# Patient Record
Sex: Female | Born: 1935
Health system: Southern US, Community
[De-identification: ages and names within clinical notes are randomized; demographics above are authoritative.]

## PROBLEM LIST (undated history)

## (undated) DIAGNOSIS — H409 Unspecified glaucoma: Secondary | ICD-10-CM

## (undated) DIAGNOSIS — D509 Iron deficiency anemia, unspecified: Secondary | ICD-10-CM

## (undated) DIAGNOSIS — E119 Type 2 diabetes mellitus without complications: Secondary | ICD-10-CM

## (undated) DIAGNOSIS — E785 Hyperlipidemia, unspecified: Secondary | ICD-10-CM

## (undated) DIAGNOSIS — D649 Anemia, unspecified: Secondary | ICD-10-CM

## (undated) DIAGNOSIS — I5030 Unspecified diastolic (congestive) heart failure: Secondary | ICD-10-CM

## (undated) DIAGNOSIS — K5730 Diverticulosis of large intestine without perforation or abscess without bleeding: Secondary | ICD-10-CM

## (undated) DIAGNOSIS — I10 Essential (primary) hypertension: Secondary | ICD-10-CM

## (undated) DIAGNOSIS — M129 Arthropathy, unspecified: Secondary | ICD-10-CM

## (undated) DIAGNOSIS — R0600 Dyspnea, unspecified: Secondary | ICD-10-CM

## (undated) HISTORY — PX: ABDOMINAL HYSTERECTOMY: SHX81

---

## 2000-04-14 ENCOUNTER — Encounter: Admission: RE | Admit: 2000-04-14 | Discharge: 2000-07-13 | Payer: Self-pay | Admitting: Orthopedic Surgery

## 2000-07-28 ENCOUNTER — Encounter: Admission: RE | Admit: 2000-07-28 | Discharge: 2000-10-26 | Payer: Self-pay | Admitting: Orthopedic Surgery

## 2000-11-04 ENCOUNTER — Encounter: Admission: RE | Admit: 2000-11-04 | Discharge: 2001-02-02 | Payer: Self-pay | Admitting: Internal Medicine

## 2000-12-02 ENCOUNTER — Encounter: Payer: Self-pay | Admitting: Emergency Medicine

## 2000-12-03 ENCOUNTER — Encounter: Payer: Self-pay | Admitting: Internal Medicine

## 2000-12-03 ENCOUNTER — Inpatient Hospital Stay (HOSPITAL_COMMUNITY): Admission: EM | Admit: 2000-12-03 | Discharge: 2000-12-05 | Payer: Self-pay | Admitting: Emergency Medicine

## 2001-02-24 ENCOUNTER — Encounter: Admission: RE | Admit: 2001-02-24 | Discharge: 2001-03-01 | Payer: Self-pay | Admitting: Internal Medicine

## 2001-06-02 ENCOUNTER — Encounter: Admission: RE | Admit: 2001-06-02 | Discharge: 2001-06-07 | Payer: Self-pay | Admitting: Internal Medicine

## 2002-01-20 ENCOUNTER — Encounter: Payer: Self-pay | Admitting: Occupational Medicine

## 2002-01-20 ENCOUNTER — Encounter: Admission: RE | Admit: 2002-01-20 | Discharge: 2002-01-20 | Payer: Self-pay | Admitting: Occupational Medicine

## 2002-04-25 ENCOUNTER — Encounter (HOSPITAL_BASED_OUTPATIENT_CLINIC_OR_DEPARTMENT_OTHER): Admission: RE | Admit: 2002-04-25 | Discharge: 2002-05-12 | Payer: Self-pay | Admitting: Orthopedic Surgery

## 2002-08-08 ENCOUNTER — Encounter (HOSPITAL_BASED_OUTPATIENT_CLINIC_OR_DEPARTMENT_OTHER): Admission: RE | Admit: 2002-08-08 | Discharge: 2002-08-12 | Payer: Self-pay | Admitting: Internal Medicine

## 2002-12-17 ENCOUNTER — Encounter: Payer: Self-pay | Admitting: Emergency Medicine

## 2002-12-17 ENCOUNTER — Emergency Department (HOSPITAL_COMMUNITY): Admission: EM | Admit: 2002-12-17 | Discharge: 2002-12-17 | Payer: Self-pay | Admitting: Emergency Medicine

## 2003-02-16 ENCOUNTER — Encounter: Payer: Self-pay | Admitting: Internal Medicine

## 2003-03-09 ENCOUNTER — Encounter: Admission: RE | Admit: 2003-03-09 | Discharge: 2003-03-09 | Payer: Self-pay | Admitting: Internal Medicine

## 2003-03-09 ENCOUNTER — Encounter: Payer: Self-pay | Admitting: Internal Medicine

## 2003-04-20 ENCOUNTER — Encounter (HOSPITAL_BASED_OUTPATIENT_CLINIC_OR_DEPARTMENT_OTHER): Admission: RE | Admit: 2003-04-20 | Discharge: 2003-07-19 | Payer: Self-pay | Admitting: Internal Medicine

## 2003-10-26 ENCOUNTER — Encounter (HOSPITAL_BASED_OUTPATIENT_CLINIC_OR_DEPARTMENT_OTHER): Admission: RE | Admit: 2003-10-26 | Discharge: 2003-11-07 | Payer: Self-pay | Admitting: Internal Medicine

## 2004-02-09 ENCOUNTER — Encounter (HOSPITAL_BASED_OUTPATIENT_CLINIC_OR_DEPARTMENT_OTHER): Admission: RE | Admit: 2004-02-09 | Discharge: 2004-02-15 | Payer: Self-pay | Admitting: Internal Medicine

## 2004-05-15 ENCOUNTER — Encounter (HOSPITAL_BASED_OUTPATIENT_CLINIC_OR_DEPARTMENT_OTHER): Admission: RE | Admit: 2004-05-15 | Discharge: 2004-05-29 | Payer: Self-pay | Admitting: Internal Medicine

## 2004-08-22 ENCOUNTER — Encounter (HOSPITAL_BASED_OUTPATIENT_CLINIC_OR_DEPARTMENT_OTHER): Admission: RE | Admit: 2004-08-22 | Discharge: 2004-08-29 | Payer: Self-pay | Admitting: Internal Medicine

## 2004-12-02 ENCOUNTER — Encounter (HOSPITAL_BASED_OUTPATIENT_CLINIC_OR_DEPARTMENT_OTHER): Admission: RE | Admit: 2004-12-02 | Discharge: 2004-12-13 | Payer: Self-pay | Admitting: Internal Medicine

## 2005-02-28 ENCOUNTER — Ambulatory Visit: Payer: Self-pay | Admitting: Endocrinology

## 2005-05-06 ENCOUNTER — Ambulatory Visit: Payer: Self-pay | Admitting: Endocrinology

## 2005-06-11 ENCOUNTER — Ambulatory Visit: Payer: Self-pay | Admitting: Internal Medicine

## 2005-06-18 ENCOUNTER — Ambulatory Visit: Payer: Self-pay

## 2005-07-09 ENCOUNTER — Ambulatory Visit: Payer: Self-pay | Admitting: Endocrinology

## 2005-09-25 ENCOUNTER — Ambulatory Visit: Payer: Self-pay | Admitting: Endocrinology

## 2005-10-08 ENCOUNTER — Ambulatory Visit: Payer: Self-pay | Admitting: Endocrinology

## 2005-10-09 ENCOUNTER — Ambulatory Visit: Payer: Self-pay | Admitting: Endocrinology

## 2005-10-24 ENCOUNTER — Ambulatory Visit: Payer: Self-pay

## 2006-04-09 ENCOUNTER — Ambulatory Visit: Payer: Self-pay | Admitting: Endocrinology

## 2006-05-21 ENCOUNTER — Ambulatory Visit: Payer: Self-pay | Admitting: Endocrinology

## 2006-08-02 ENCOUNTER — Emergency Department (HOSPITAL_COMMUNITY): Admission: EM | Admit: 2006-08-02 | Discharge: 2006-08-02 | Payer: Self-pay | Admitting: Emergency Medicine

## 2006-08-11 ENCOUNTER — Ambulatory Visit: Payer: Self-pay | Admitting: Endocrinology

## 2006-09-23 ENCOUNTER — Ambulatory Visit: Payer: Self-pay | Admitting: Endocrinology

## 2006-12-30 ENCOUNTER — Ambulatory Visit: Payer: Self-pay | Admitting: Endocrinology

## 2007-01-27 ENCOUNTER — Ambulatory Visit: Payer: Self-pay | Admitting: Endocrinology

## 2007-01-27 LAB — CONVERTED CEMR LAB: Hgb A1c MFr Bld: 7.6 % — ABNORMAL HIGH (ref 4.6–6.0)

## 2007-06-04 ENCOUNTER — Ambulatory Visit: Payer: Self-pay | Admitting: Endocrinology

## 2007-07-16 ENCOUNTER — Encounter: Payer: Self-pay | Admitting: Endocrinology

## 2007-07-16 DIAGNOSIS — I10 Essential (primary) hypertension: Secondary | ICD-10-CM | POA: Insufficient documentation

## 2007-07-16 DIAGNOSIS — I2510 Atherosclerotic heart disease of native coronary artery without angina pectoris: Secondary | ICD-10-CM | POA: Insufficient documentation

## 2007-07-16 DIAGNOSIS — E785 Hyperlipidemia, unspecified: Secondary | ICD-10-CM | POA: Insufficient documentation

## 2007-07-16 DIAGNOSIS — E118 Type 2 diabetes mellitus with unspecified complications: Secondary | ICD-10-CM | POA: Insufficient documentation

## 2007-07-16 DIAGNOSIS — M810 Age-related osteoporosis without current pathological fracture: Secondary | ICD-10-CM | POA: Insufficient documentation

## 2007-07-16 DIAGNOSIS — E119 Type 2 diabetes mellitus without complications: Secondary | ICD-10-CM

## 2007-07-16 DIAGNOSIS — J309 Allergic rhinitis, unspecified: Secondary | ICD-10-CM | POA: Insufficient documentation

## 2007-07-16 HISTORY — DX: Hyperlipidemia, unspecified: E78.5

## 2007-07-16 HISTORY — DX: Type 2 diabetes mellitus without complications: E11.9

## 2008-02-03 ENCOUNTER — Ambulatory Visit: Payer: Self-pay | Admitting: Endocrinology

## 2008-02-03 ENCOUNTER — Encounter: Payer: Self-pay | Admitting: Endocrinology

## 2008-02-03 DIAGNOSIS — R05 Cough: Secondary | ICD-10-CM

## 2008-02-03 DIAGNOSIS — R059 Cough, unspecified: Secondary | ICD-10-CM | POA: Insufficient documentation

## 2008-03-02 ENCOUNTER — Encounter (INDEPENDENT_AMBULATORY_CARE_PROVIDER_SITE_OTHER): Payer: Self-pay | Admitting: *Deleted

## 2008-05-30 DIAGNOSIS — K5730 Diverticulosis of large intestine without perforation or abscess without bleeding: Secondary | ICD-10-CM | POA: Insufficient documentation

## 2008-05-30 DIAGNOSIS — M129 Arthropathy, unspecified: Secondary | ICD-10-CM

## 2008-05-30 DIAGNOSIS — K649 Unspecified hemorrhoids: Secondary | ICD-10-CM | POA: Insufficient documentation

## 2008-05-30 HISTORY — DX: Arthropathy, unspecified: M12.9

## 2008-05-30 HISTORY — DX: Diverticulosis of large intestine without perforation or abscess without bleeding: K57.30

## 2008-06-01 ENCOUNTER — Ambulatory Visit: Payer: Self-pay | Admitting: Internal Medicine

## 2008-06-01 DIAGNOSIS — K5900 Constipation, unspecified: Secondary | ICD-10-CM | POA: Insufficient documentation

## 2008-07-21 ENCOUNTER — Encounter: Payer: Self-pay | Admitting: Internal Medicine

## 2008-07-21 ENCOUNTER — Ambulatory Visit: Payer: Self-pay | Admitting: Internal Medicine

## 2008-07-27 ENCOUNTER — Ambulatory Visit: Payer: Self-pay | Admitting: Family Medicine

## 2008-07-27 ENCOUNTER — Ambulatory Visit: Payer: Self-pay | Admitting: Endocrinology

## 2008-07-29 LAB — CONVERTED CEMR LAB
BUN: 14 mg/dL (ref 6–23)
Basophils Absolute: 0.1 10*3/uL (ref 0.0–0.1)
Basophils Relative: 1.2 % (ref 0.0–3.0)
CO2: 28 meq/L (ref 19–32)
Calcium: 9.1 mg/dL (ref 8.4–10.5)
Chloride: 100 meq/L (ref 96–112)
Creatinine, Ser: 0.9 mg/dL (ref 0.4–1.2)
Eosinophils Absolute: 0.1 10*3/uL (ref 0.0–0.7)
Eosinophils Relative: 1.1 % (ref 0.0–5.0)
Folate: 10.2 ng/mL
GFR calc Af Amer: 79 mL/min
GFR calc non Af Amer: 65 mL/min
Glucose, Bld: 295 mg/dL — ABNORMAL HIGH (ref 70–99)
HCT: 36.3 % (ref 36.0–46.0)
Hemoglobin: 12 g/dL (ref 12.0–15.0)
Hgb A1c MFr Bld: 9.8 % — ABNORMAL HIGH (ref 4.6–6.0)
Iron: 47 ug/dL (ref 42–145)
Lymphocytes Relative: 23.6 % (ref 12.0–46.0)
MCHC: 33.1 g/dL (ref 30.0–36.0)
MCV: 85.6 fL (ref 78.0–100.0)
Monocytes Absolute: 0.4 10*3/uL (ref 0.1–1.0)
Monocytes Relative: 7.7 % (ref 3.0–12.0)
Neutro Abs: 3.7 10*3/uL (ref 1.4–7.7)
Neutrophils Relative %: 66.4 % (ref 43.0–77.0)
Platelets: 339 10*3/uL (ref 150–400)
Potassium: 3.8 meq/L (ref 3.5–5.1)
RBC: 4.24 M/uL (ref 3.87–5.11)
RDW: 13.5 % (ref 11.5–14.6)
Saturation Ratios: 14 % — ABNORMAL LOW (ref 20.0–50.0)
Sodium: 137 meq/L (ref 135–145)
TSH: 1 microintl units/mL (ref 0.35–5.50)
Transferrin: 239.7 mg/dL (ref >=212.0)
Vitamin B-12: 352 pg/mL (ref 211–911)
WBC: 5.6 10*3/uL (ref 4.5–10.5)

## 2008-07-30 ENCOUNTER — Encounter: Payer: Self-pay | Admitting: Internal Medicine

## 2008-09-28 ENCOUNTER — Telehealth: Payer: Self-pay | Admitting: Endocrinology

## 2008-10-03 ENCOUNTER — Ambulatory Visit: Payer: Self-pay | Admitting: Endocrinology

## 2008-11-10 ENCOUNTER — Telehealth (INDEPENDENT_AMBULATORY_CARE_PROVIDER_SITE_OTHER): Payer: Self-pay | Admitting: *Deleted

## 2009-01-25 ENCOUNTER — Ambulatory Visit: Payer: Self-pay | Admitting: Endocrinology

## 2009-01-29 ENCOUNTER — Encounter (INDEPENDENT_AMBULATORY_CARE_PROVIDER_SITE_OTHER): Payer: Self-pay | Admitting: *Deleted

## 2009-01-29 ENCOUNTER — Telehealth: Payer: Self-pay | Admitting: Endocrinology

## 2009-02-02 ENCOUNTER — Ambulatory Visit: Payer: Self-pay | Admitting: Endocrinology

## 2009-02-04 LAB — CONVERTED CEMR LAB: Hgb A1c MFr Bld: 9.6 % — ABNORMAL HIGH (ref 4.6–6.0)

## 2009-05-07 ENCOUNTER — Ambulatory Visit: Payer: Self-pay | Admitting: Endocrinology

## 2009-05-07 DIAGNOSIS — D509 Iron deficiency anemia, unspecified: Secondary | ICD-10-CM | POA: Insufficient documentation

## 2009-05-07 DIAGNOSIS — R0602 Shortness of breath: Secondary | ICD-10-CM | POA: Insufficient documentation

## 2009-05-07 HISTORY — DX: Iron deficiency anemia, unspecified: D50.9

## 2009-05-07 LAB — CONVERTED CEMR LAB
BUN: 14 mg/dL (ref 6–23)
CO2: 30 meq/L (ref 19–32)
Calcium: 8.7 mg/dL (ref 8.4–10.5)
Chloride: 104 meq/L (ref 96–112)
Creatinine, Ser: 0.7 mg/dL (ref 0.4–1.2)
GFR calc non Af Amer: 105.55 mL/min (ref >60)
Glucose, Bld: 409 mg/dL — ABNORMAL HIGH (ref 70–99)
Hgb A1c MFr Bld: 9.7 % — ABNORMAL HIGH (ref 4.6–6.5)
Iron: 51 ug/dL (ref 42–145)
Potassium: 3.7 meq/L (ref 3.5–5.1)
Pro B Natriuretic peptide (BNP): 83 pg/mL (ref 0.0–100.0)
Saturation Ratios: 15.7 % — ABNORMAL LOW (ref 20.0–50.0)
Sodium: 138 meq/L (ref 135–145)
Transferrin: 231.5 mg/dL (ref 212.0–360.0)

## 2009-05-08 ENCOUNTER — Ambulatory Visit: Payer: Self-pay | Admitting: Endocrinology

## 2009-05-10 LAB — CONVERTED CEMR LAB
Basophils Absolute: 0 10*3/uL (ref 0.0–0.1)
Basophils Relative: 0.9 % (ref 0.0–3.0)
Eosinophils Absolute: 0.1 10*3/uL (ref 0.0–0.7)
Eosinophils Relative: 1.6 % (ref 0.0–5.0)
HCT: 34.1 % — ABNORMAL LOW (ref 36.0–46.0)
Hemoglobin: 11.7 g/dL — ABNORMAL LOW (ref 12.0–15.0)
Lymphocytes Relative: 22.4 % (ref 12.0–46.0)
Lymphs Abs: 1.1 10*3/uL (ref 0.7–4.0)
MCHC: 34.3 g/dL (ref 30.0–36.0)
MCV: 86.3 fL (ref 78.0–100.0)
Monocytes Absolute: 0.4 10*3/uL (ref 0.1–1.0)
Monocytes Relative: 8.4 % (ref 3.0–12.0)
Neutro Abs: 3.3 10*3/uL (ref 1.4–7.7)
Neutrophils Relative %: 66.7 % (ref 43.0–77.0)
Platelets: 294 10*3/uL (ref 150.0–400.0)
RBC: 3.95 M/uL (ref 3.87–5.11)
RDW: 13.7 % (ref 11.5–14.6)
WBC: 4.9 10*3/uL (ref 4.5–10.5)

## 2009-05-15 ENCOUNTER — Telehealth: Payer: Self-pay | Admitting: Endocrinology

## 2009-10-06 ENCOUNTER — Emergency Department (HOSPITAL_COMMUNITY): Admission: EM | Admit: 2009-10-06 | Discharge: 2009-10-07 | Payer: Self-pay | Admitting: Emergency Medicine

## 2009-10-12 ENCOUNTER — Ambulatory Visit: Payer: Self-pay | Admitting: Endocrinology

## 2009-10-12 LAB — CONVERTED CEMR LAB
ALT: 17 units/L (ref 0–35)
AST: 24 units/L (ref 0–37)
Albumin: 3.9 g/dL (ref 3.5–5.2)
Alkaline Phosphatase: 48 units/L (ref 39–117)
BUN: 14 mg/dL (ref 6–23)
Basophils Absolute: 0 10*3/uL (ref 0.0–0.1)
Basophils Relative: 0.6 % (ref 0.0–3.0)
Bilirubin Urine: NEGATIVE
Bilirubin, Direct: 0 mg/dL (ref 0.0–0.3)
CO2: 30 meq/L (ref 19–32)
Calcium: 9 mg/dL (ref 8.4–10.5)
Chloride: 101 meq/L (ref 96–112)
Cholesterol: 167 mg/dL (ref 0–200)
Creatinine, Ser: 0.9 mg/dL (ref 0.4–1.2)
Creatinine,U: 43.4 mg/dL
Eosinophils Absolute: 0.2 10*3/uL (ref 0.0–0.7)
Eosinophils Relative: 2.8 % (ref 0.0–5.0)
GFR calc non Af Amer: 78.88 mL/min (ref >60)
Glucose, Bld: 295 mg/dL — ABNORMAL HIGH (ref 70–99)
HCT: 36 % (ref 36.0–46.0)
HDL: 31.4 mg/dL — ABNORMAL LOW (ref >39.00)
Hemoglobin, Urine: NEGATIVE
Hemoglobin: 12.2 g/dL (ref 12.0–15.0)
Hgb A1c MFr Bld: 10.2 % — ABNORMAL HIGH (ref 4.6–6.5)
Iron: 43 ug/dL (ref 42–145)
Ketones, ur: NEGATIVE mg/dL
LDL Cholesterol: 120 mg/dL — ABNORMAL HIGH (ref 0–99)
Leukocytes, UA: NEGATIVE
Lymphocytes Relative: 23.8 % (ref 12.0–46.0)
Lymphs Abs: 1.3 10*3/uL (ref 0.7–4.0)
MCHC: 34 g/dL (ref 30.0–36.0)
MCV: 86.5 fL (ref 78.0–100.0)
Microalb Creat Ratio: 20.7 mg/g (ref 0.0–30.0)
Microalb, Ur: 0.9 mg/dL (ref 0.0–1.9)
Monocytes Absolute: 0.6 10*3/uL (ref 0.1–1.0)
Monocytes Relative: 11 % (ref 3.0–12.0)
Neutro Abs: 3.4 10*3/uL (ref 1.4–7.7)
Neutrophils Relative %: 61.8 % (ref 43.0–77.0)
Nitrite: NEGATIVE
Platelets: 310 10*3/uL (ref 150.0–400.0)
Potassium: 3.5 meq/L (ref 3.5–5.1)
RBC: 4.16 M/uL (ref 3.87–5.11)
RDW: 13.5 % (ref 11.5–14.6)
Saturation Ratios: 12.9 % — ABNORMAL LOW (ref 20.0–50.0)
Sodium: 140 meq/L (ref 135–145)
Specific Gravity, Urine: 1.015 (ref 1.000–1.030)
TSH: 0.94 microintl units/mL (ref 0.35–5.50)
Total Bilirubin: 0.5 mg/dL (ref 0.3–1.2)
Total CHOL/HDL Ratio: 5
Total Protein, Urine: NEGATIVE mg/dL
Total Protein: 7.7 g/dL (ref 6.0–8.3)
Transferrin: 237.3 mg/dL (ref 212.0–360.0)
Triglycerides: 76 mg/dL (ref 0.0–149.0)
Urine Glucose: >=1000 mg/dL
Urobilinogen, UA: 1 (ref 0.0–1.0)
VLDL: 15.2 mg/dL (ref 0.0–40.0)
WBC: 5.5 10*3/uL (ref 4.5–10.5)
pH: 6 (ref 5.0–8.0)

## 2009-10-25 ENCOUNTER — Ambulatory Visit: Payer: Self-pay | Admitting: Endocrinology

## 2009-11-29 ENCOUNTER — Ambulatory Visit: Payer: Self-pay | Admitting: Endocrinology

## 2010-03-01 ENCOUNTER — Telehealth: Payer: Self-pay | Admitting: Endocrinology

## 2010-04-08 ENCOUNTER — Encounter: Payer: Self-pay | Admitting: Endocrinology

## 2010-04-23 ENCOUNTER — Telehealth: Payer: Self-pay | Admitting: Endocrinology

## 2010-12-25 ENCOUNTER — Encounter (INDEPENDENT_AMBULATORY_CARE_PROVIDER_SITE_OTHER): Payer: Self-pay | Admitting: *Deleted

## 2011-01-21 NOTE — Assessment & Plan Note (Signed)
Summary: 3 mos f/u  $50 / cd   Vital Signs:  Patient profile:   77 year old female Height:      71 inches Weight:      181 pounds BMI:     25.34 Temp:     97.8 degrees F oral Pulse rate:   65 / minute BP sitting:   118 / 58  (left arm) Cuff size:   regular  Vitals Entered By: Charlynne Cousins CMA (May 07, 2009 8:21 AM) CC: follow-up visit, pt states doing well/ ab   Primary Provider:  Luisa Hart  CC:  follow-up visit and pt states doing well/ ab.  History of Present Illness: pt states 1 day of mild doe.  no wheezing. ran out of actos 1 month ago.  no cbg record, but states cbg's are well-controlled. pt does not take fe supplement  Current Medications (verified): 1)  Actos 45 Mg Tabs (Pioglitazone Hcl) .... One Tablet By Mouth Once Daily 2)  Cozaar 100 Mg Tabs (Losartan Potassium) .... One Tablet By Mouth Once Daily 3)  Klor-Con M20 20 Meq Tbcr (Potassium Chloride Crys Cr) .... Take 1 Tablet By Mouth Every Morning 4)  Metformin Hcl 1000 Mg Tabs (Metformin Hcl) .... One Tablet By Mouth Two Times A Day 5)  Zestoretic 20-12.5 Mg Tabs (Lisinopril-Hydrochlorothiazide) .... 2 Tablet By Mouth Once Daily 6)  Onetouch Ultra Test   Strp (Glucose Blood) .... Use As Directed 7)  Aspirin 81 Mg  Tabs (Aspirin) .... One Tablet By Mouth Once Daily 8)  Miralax   Powd (Polyethylene Glycol 3350) .... One Packet As Needed 9)  Nifedipine 30 Mg  Tb24 (Nifedipine) .... 2 Tablets By Mouth Once Daily 10)  Fluoxetine Hcl 20 Mg  Caps (Fluoxetine Hcl) .... Qd  Allergies (verified): 1)  ! Penicillin 2)  ! Codeine  Past History:  Past Medical History:    Chronic Nonadherence     CONSTIPATION (ICD-564.00)    SCREENING COLORECTAL-CANCER (ICD-V76.51)    ARTHRITIS (ICD-716.90)    HEMORRHOIDS (ICD-455.6)    DIVERTICULOSIS, COLON (ICD-562.10)    COUGH (ICD-786.2)    OSTEOPOROSIS (ICD-733.00)    HYPERTENSION (ICD-401.9)    HYPERLIPIDEMIA, rx refused (ICD-272.4)    DIABETES MELLITUS, TYPE II  (ICD-250.00)    CORONARY ARTERY DISEASE (ICD-414.00)    ANEMIA-NOS (ICD-285.9)    ALLERGIC RHINITIS (ICD-477.9) (07/27/2008)  Past Surgical History:    Hysterectomy    Cardiac catherization    Pen Knee surgery (1975)    Adenosine Stress Cardiolite (06/18/2005)    Carotid duplex (10/24/2005)    appendectomy    left 5th toe "bone shaved" 2010  Review of Systems  The patient denies fever, chest pain, and prolonged cough.    Physical Exam  General:  obese.   Lungs:  Clear to auscultation bilaterally. Normal respiratory effort.  Additional Exam:  CHEST - 2 VIEW Query COPD.  Iron Saturation      [L]  15.7 %                      20.0-50.0 B-Type Natiuretic Peptide    83 Hemoglobin A1C       [H]  9.7 %    Impression & Recommendations:  Problem # 1:  DIABETES MELLITUS, TYPE II (ICD-250.00) therapy limited by noncompliance.  i'll do the best i can.  Problem # 2:  DYSPNEA (XX123456) uncertain etiology  Problem # 3:  ANEMIA, IRON DEFICIENCY (XX123456) uncertain etiology  Other Orders: T-2 View  CXR (71020TC) TLB-IBC Pnl (Iron/FE;Transferrin) (83550-IBC) TLB-BNP (B-Natriuretic Peptide) (83880-BNPR) TLB-A1C / Hgb A1C (Glycohemoglobin) (83036-A1C) TLB-BMP (Basic Metabolic Panel-BMET) (99991111) TLB-CBC Platelet - w/Differential (85025-CBCD) Pulmonary Referral (Pulmonary) Gastroenterology Referral (GI) Est. Patient Level IV [id number])  Patient Instructions: 1)  physical 3 mos 2)  i again advised insulin 3)  ref for egd 4)  check pft

## 2011-01-21 NOTE — Letter (Signed)
Summary: Patient Notice- Polyp Results  [facility]  [address] [place], [place] [address]   Phone: [phone]  Fax: [phone]        July 30, 2008 MRN: [id number]    [place], [place]  [address]    Dear Ms. OSMOND,  I am pleased to inform you that the colon polyp(s) removed during your recent colonoscopy was (were) found to be benign (no cancer detected) upon pathologic examination.  I recommend you have a repeat colonoscopy examination in 5 years to look for recurrent polyps, as having colon polyps increases your risk for having recurrent polyps or even colon cancer in the future.  Should you develop new or worsening symptoms of abdominal pain, bowel habit changes or bleeding from the rectum or bowels, please schedule an evaluation with either your primary care physician or with me.  Additional information/recommendations:  _X_ No further action with gastroenterology is needed at this time. Please      follow-up with your primary care physician for your other healthcare      needs.   Please call us if you are having persistent problems or have questions about your condition that have not been fully answered at this time.  Sincerely,  Irene Shipper MD  This letter has been electronically signed by your physician.

## 2011-01-21 NOTE — Progress Notes (Signed)
Summary: Test strips  Phone Note From Pharmacy   Caller: [employer] [phone] Call For: [phone]  Summary of Call: PA request--One touch ultra test strips. I confirmed with [employer] that the patient plan only cover # 153 for every 90 days. Any additional must be paid for out of pocket. Patient can get he usual #50 on 03/18/10. 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 Closed phone note until patient contacts the office.  **returned fax to pharmacy stating that patient must contact office. Initial call taken by: Ernestene Mention,  March 01, 2010 10:30 AM

## 2011-01-21 NOTE — Assessment & Plan Note (Signed)
Summary: 1 MTH FU  STC   Vital Signs:  Patient profile:   77 year old female Height:      71 inches (180.34 cm) Weight:      184.38 pounds (83.81 kg) O2 Sat:      97 % on Room air Temp:     97.2 degrees F (36.22 degrees C) oral Pulse rate:   57 / minute BP sitting:   162 / 58  (left arm) Cuff size:   large  Vitals Entered By: Gardenia Phlegm CMA (November 29, 2009 8:38 AM)  O2 Flow:  Room air CC: 1 month follow up/ CF Is Patient Diabetic? Yes   Primary Provider:  Luisa Hart  CC:  1 month follow up/ CF.  History of Present Illness: pt is chronically dissatisfied.  she says her copay ($25) is too high, and she had to wait (she was 30 minutes early for her appointment), and her results are never on phone tree. she insists she takes 3 different pills for htn, while our med list shows only 2. she takes amaryl and metformin as rx'ed.  no cbg record, but states cbg's are well-controlled. she takes zocor as rx'ed.     Current Medications (verified): 1)  Klor-Con M20 20 Meq Tbcr (Potassium Chloride Crys Cr) .... Take 1 Tablet By Mouth Every Morning 2)  Metformin Hcl 1000 Mg Tabs (Metformin Hcl) .... One Tablet By Mouth Two Times A Day 3)  Onetouch Ultra Test   Strp (Glucose Blood) .... Use As Directed 4)  Aspirin 81 Mg  Tabs (Aspirin) .... One Tablet By Mouth Once Daily 5)  Miralax   Powd (Polyethylene Glycol 3350) .... One Packet As Needed 6)  Nifedipine 30 Mg  Tb24 (Nifedipine) .... 2 Tablets By Mouth Once Daily 7)  Glimepiride 4 Mg Tabs (Glimepiride) .Marland Kitchen.. 1 Bid 8)  Losartan Potassium-Hctz 100-25 Mg Tabs (Losartan Potassium-Hctz) .Marland Kitchen.. 1 Qd 9)  Simvastatin 40 Mg Tabs (Simvastatin) .Marland Kitchen.. 1 Qhs 10)  Screening Mammogram  Allergies (verified): 1)  ! Penicillin 2)  ! Codeine  Past History:  Past Medical History: Last updated: 07/27/2008 Chronic Nonadherence  CONSTIPATION (ICD-564.00) SCREENING COLORECTAL-CANCER (ICD-V76.51) ARTHRITIS (ICD-716.90) HEMORRHOIDS  (ICD-455.6) DIVERTICULOSIS, COLON (ICD-562.10) COUGH (ICD-786.2) OSTEOPOROSIS (ICD-733.00) HYPERTENSION (ICD-401.9) HYPERLIPIDEMIA, rx refused (ICD-272.4) DIABETES MELLITUS, TYPE II (ICD-250.00) CORONARY ARTERY DISEASE (ICD-414.00) ANEMIA-NOS (ICD-285.9) ALLERGIC RHINITIS (ICD-477.9)  Review of Systems  The patient denies chest pain and dyspnea on exertion.    Physical Exam  General:  obese.  no distress  Extremities:  no edema   Impression & Recommendations:  Problem # 1:  HYPERTENSION (ICD-401.9) ? complicance  Problem # 2:  DIABETES MELLITUS, TYPE II (ICD-250.00) ? control  Problem # 3:  HYPERLIPIDEMIA (ICD-272.4)  Other Orders: TLB-Lipid Panel (80061-LIPID) TLB-Hepatic/Liver Function Pnl (80076-HEPATIC) T-Fructosamine [id number]) Est. Patient Level IV [id number])  Patient Instructions: 1)  pt refuses to increase bp meds.  2)  therefore, continue same meds. 3)  Please schedule a follow-up appointment in 6 months. 4)  tests are being ordered for you today.  a few days after the test(s), please call [phone] to hear your test results. 5)  i told pt that she is not being mistreated, and she is at risk for serious health problems by her refusal of rx.  she refuses blood tests today, and refuses to take this instruction sheet.

## 2011-01-21 NOTE — Progress Notes (Signed)
Summary: ov needed  Phone Note Outgoing Call   Summary of Call: MD notes that the patient needs f/u office visit. Attempted to call patient at home and was notified that it is no longer her number. I also tried work number and that goes to [employer] of a Mrs. Lavella Hammock. Initial call taken by: Ernestene Mention,  Apr 23, 2010 3:51 PM  Follow-up for Phone Call        noted, thank you Follow-up by: Donavan Foil MD,  Apr 23, 2010 3:54 PM

## 2011-01-21 NOTE — Progress Notes (Signed)
Summary: pending labs  Phone Note Outgoing Call   Call placed by: Tomma Lightning,  January 29, 2009 11:40 AM Call placed to: Patient Summary of Call: Pt saw Dr. Loanne Drilling on 01/25/09 and he wanted her to go downstar[place] for labwork.  Patient did'nt go downstairs for lab work.  Sending patient a reminder letter to come back in for labwork. Initial call taken by: Tomma Lightning,  January 29, 2009 11:41 AM

## 2011-01-21 NOTE — Miscellaneous (Signed)
Summary: BONE DENSITY  Clinical Lists Changes  Orders: Added new Test order of T-Lumbar Vertebral Assessment (77082) - Signed 

## 2011-01-21 NOTE — Progress Notes (Signed)
  Phone Note Call from Patient   Call For: Donavan Foil MD Summary of Call: pt was sent to referral coordinator for referral to GI and Pulmon. Mary in ref. called pt and she did not know why she was being referred so I called pt and informed her of why MD requested or suggested she have these ref and pt said she "was not going to make any appts right now because she was no taking any time off work" Please advise/ ab  Follow-up for Phone Call        noted, thank you Follow-up by: Donavan Foil MD,  May 15, 2009 4:33 PM

## 2011-01-21 NOTE — Assessment & Plan Note (Signed)
Summary: RASH/ ITCHING / NWS   Vital Signs:  Patient Profile:   77 Years Old Female Height:     71 inches Weight:      179.0 pounds O2 Sat:      97 % O2 treatment:    Room Air Temp:     97.0 degrees F oral Pulse rate:   74 / minute BP sitting:   178 / 72  (left arm) Cuff size:   regular  Pt. in pain?   no  Vitals Entered By: Tomma Lightning (January 25, 2009 9:22 AM)                  PCP:  Luisa Hart  Chief Complaint:  ? RASH PT STATES SHE IS ITCHING ALL OVER.  History of Present Illness: i day of itching on the abdomen and axillae.  unable to cite precip factor. pt says she takes her bp meds as rx'ed, but did not take today. she says her dm is well-controlled but has no cbg record    Prior Medications Reviewed Using: Patient Recall  Prior Medication List:  ACTOS 45 MG TABS (PIOGLITAZONE HCL) one tablet by mouth once daily COZAAR 100 MG TABS (LOSARTAN POTASSIUM) one tablet by mouth once daily KLOR-CON M20 20 MEQ TBCR (POTASSIUM CHLORIDE CRYS CR) Take 1 tablet by mouth every morning METFORMIN HCL 1000 MG TABS (METFORMIN HCL) one tablet by mouth two times a day ZESTORETIC 20-12.5 MG TABS (LISINOPRIL-HYDROCHLOROTHIAZIDE) 2 tablet by mouth once daily ONETOUCH ULTRA TEST   STRP (GLUCOSE BLOOD) use as directed ASPIRIN 81 MG  TABS (ASPIRIN) one tablet by mouth once daily MIRALAX   POWD (POLYETHYLENE GLYCOL 3350) one packet as needed NIFEDIPINE 30 MG  TB24 (NIFEDIPINE) 2 tablets by mouth once daily FLUOXETINE HCL 20 MG  CAPS (FLUOXETINE HCL) qd AMARYL 4 MG  TABS (GLIMEPIRIDE) bid   Updated Prior Medication List: ACTOS 45 MG TABS (PIOGLITAZONE HCL) one tablet by mouth once daily COZAAR 100 MG TABS (LOSARTAN POTASSIUM) one tablet by mouth once daily KLOR-CON M20 20 MEQ TBCR (POTASSIUM CHLORIDE CRYS CR) Take 1 tablet by mouth every morning METFORMIN HCL 1000 MG TABS (METFORMIN HCL) one tablet by mouth two times a day ZESTORETIC 20-12.5 MG TABS  (LISINOPRIL-HYDROCHLOROTHIAZIDE) 2 tablet by mouth once daily ONETOUCH ULTRA TEST   STRP (GLUCOSE BLOOD) use as directed ASPIRIN 81 MG  TABS (ASPIRIN) one tablet by mouth once daily MIRALAX   POWD (POLYETHYLENE GLYCOL 3350) one packet as needed NIFEDIPINE 30 MG  TB24 (NIFEDIPINE) 2 tablets by mouth once daily FLUOXETINE HCL 20 MG  CAPS (FLUOXETINE HCL) qd AMARYL 4 MG  TABS (GLIMEPIRIDE) bid  Current Allergies (reviewed today): ! PENICILLIN ! CODEINE  Past Medical History:    Reviewed history from 07/27/2008 and no changes required:       Chronic Nonadherence        CONSTIPATION (ICD-564.00)       SCREENING COLORECTAL-CANCER (ICD-V76.51)       ARTHRITIS (ICD-716.90)       HEMORRHOIDS (ICD-455.6)       DIVERTICULOSIS, COLON (ICD-562.10)       COUGH (ICD-786.2)       OSTEOPOROSIS (ICD-733.00)       HYPERTENSION (ICD-401.9)       HYPERLIPIDEMIA, rx refused (ICD-272.4)       DIABETES MELLITUS, TYPE II (ICD-250.00)       CORONARY ARTERY DISEASE (ICD-414.00)       ANEMIA-NOS (ICD-285.9)       ALLERGIC  RHINITIS (ICD-477.9)     Review of Systems  The patient denies fever, weight loss, and weight gain.     Physical Exam  General:     obese.   Skin:     no rash seen    Impression & Recommendations:  Problem # 1:  DIABETES MELLITUS, TYPE II (XX123456) uncertain control  Problem # 2:  HYPERTENSION (ICD-401.9) therapy limited by noncompliance   Problem # 3:  pruritis uncertain etiology   Medications Added to Medication List This Visit: 1)  Triamcinolone Acetonide 0.025 % Crea (Triamcinolone acetonide) .... Three times a day as needed itching   Patient Instructions: 1)  tac cream 0.025% three times a day as needed 2)  always take medication as prescribed. 3)  a1c today 4)  physical 3 mos with a1c and microalb 250.00   Prescriptions: TRIAMCINOLONE ACETONIDE 0.025 % CREA (TRIAMCINOLONE ACETONIDE) three times a day as needed itching  #1 med tube x 1   Entered  and Authorized by:   Donavan Foil MD   Signed by:   Donavan Foil MD on 01/25/2009   Method used:   Electronically to        [facility] (retail)       [address]       [place], [place]  [address]       Ph: [phone]       Fax: [phone]   RxID:   [id number]

## 2011-01-21 NOTE — Procedures (Signed)
Summary: colon/[facility]  colon/[facility]   Imported By: Bubba Hales 07/25/2008 08:49:41  _____________________________________________________________________  External Attachment:    Type:   Image     Comment:   External Document

## 2011-01-21 NOTE — Assessment & Plan Note (Signed)
Summary: flu shot/sae/cd  Nurse Visit   Vital Signs:  Patient Profile:   77 Years Old Female Height:     71 inches Temp:     97.9 degrees F oral  Vitals Entered By: Hector Brunswick (October 03, 2008 8:48 AM)                 Prior Medications: ACTOS 45 MG TABS (PIOGLITAZONE HCL) one tablet by mouth once daily COZAAR 100 MG TABS (LOSARTAN POTASSIUM) one tablet by mouth once daily KLOR-CON M20 20 MEQ TBCR (POTASSIUM CHLORIDE CRYS CR) Take 1 tablet by mouth every morning METFORMIN HCL 1000 MG TABS (METFORMIN HCL) one tablet by mouth two times a day ZESTORETIC 20-12.5 MG TABS (LISINOPRIL-HYDROCHLOROTHIAZIDE) 2 tablet by mouth once daily ONETOUCH ULTRA TEST   STRP (GLUCOSE BLOOD) use as directed ASPIRIN 81 MG  TABS (ASPIRIN) one tablet by mouth once daily MIRALAX   POWD (POLYETHYLENE GLYCOL 3350) one packet as needed NIFEDIPINE 30 MG  TB24 (NIFEDIPINE) 2 tablets by mouth once daily FLUOXETINE HCL 20 MG  CAPS (FLUOXETINE HCL) qd AMARYL 4 MG  TABS (GLIMEPIRIDE) bid Current Allergies: ! PENICILLIN ! CODEINE    Orders Added: 1)  Admin 1st Vaccine [90471] 2)  Flu Vaccine 44yrs + [id number]    Flu Vaccine Consent Questions     Do you have a history of severe allergic reactions to this vaccine? no    Any prior history of allergic reactions to egg and/or gelatin? no    Do you have a sensitivity to the preservative Thimersol? no    Do you have a past history of Guillan-Barre Syndrome? no    Do you currently have an acute febrile illness? no    Have you ever had a severe reaction to latex? no    Vaccine information given and explained to patient? yes    Are you currently pregnant? no    Lot Number:[id number]   Site Given  Left Deltoid IM .opcflu

## 2011-01-21 NOTE — Assessment & Plan Note (Signed)
Summary: COLD/CONGESTION/SORE THROAT/NWS   Vital Signs:  Patient profile:   77 year old female Height:      71 inches (180.34 cm) Weight:      183.38 pounds (83.35 kg) O2 Sat:      96 % on Room air Temp:     98.0 degrees F (36.67 degrees C) oral Pulse rate:   79 / minute BP sitting:   152 / 68  (left arm) Cuff size:   large  Vitals Entered By: Gardenia Phlegm CMA (October 12, 2009 9:13 AM)  O2 Flow:  Room air CC: coughing, throat itching, headache x3 days/ CF Is Patient Diabetic? Yes   Primary Hildreth Robart:  Krista Rosales  CC:  coughing, throat itching, and headache x3 days/ CF.  History of Present Illness: see above, x 4 days. pt stopped actos, out of fear of side-effects. she takes bp meds as rx'ed, and says she does not have a cough in general. she says depression is better, and she no longer takes prozac  Current Medications (verified): 1)  Actos 45 Mg Tabs (Pioglitazone Hcl) .... One Tablet By Mouth Once Daily 2)  Cozaar 100 Mg Tabs (Losartan Potassium) .... One Tablet By Mouth Once Daily 3)  Klor-Con M20 20 Meq Tbcr (Potassium Chloride Crys Cr) .... Take 1 Tablet By Mouth Every Morning 4)  Metformin Hcl 1000 Mg Tabs (Metformin Hcl) .... One Tablet By Mouth Two Times A Day 5)  Zestoretic 20-12.5 Mg Tabs (Lisinopril-Hydrochlorothiazide) .... 2 Tablet By Mouth Once Daily 6)  Onetouch Ultra Test   Strp (Glucose Blood) .... Use As Directed 7)  Aspirin 81 Mg  Tabs (Aspirin) .... One Tablet By Mouth Once Daily 8)  Miralax   Powd (Polyethylene Glycol 3350) .... One Packet As Needed 9)  Nifedipine 30 Mg  Tb24 (Nifedipine) .... 2 Tablets By Mouth Once Daily 10)  Fluoxetine Hcl 20 Mg  Caps (Fluoxetine Hcl) .... Qd  Allergies (verified): 1)  ! Penicillin 2)  ! Codeine  Past History:  Past Medical History: Last updated: 07/27/2008 Chronic Nonadherence  CONSTIPATION (ICD-564.00) SCREENING COLORECTAL-CANCER (ICD-V76.51) ARTHRITIS (ICD-716.90) HEMORRHOIDS  (ICD-455.6) DIVERTICULOSIS, COLON (ICD-562.10) COUGH (ICD-786.2) OSTEOPOROSIS (ICD-733.00) HYPERTENSION (ICD-401.9) HYPERLIPIDEMIA, rx refused (ICD-272.4) DIABETES MELLITUS, TYPE II (ICD-250.00) CORONARY ARTERY DISEASE (ICD-414.00) ANEMIA-NOS (ICD-285.9) ALLERGIC RHINITIS (ICD-477.9)  Review of Systems  The patient denies fever and dyspnea on exertion.    Physical Exam  General:  obese.  no distress  Head:  head: no deformity eyes: no periorbital swelling, no proptosis external nose and ears are normal mouth: no lesion seen Ears:  both tm's are red Psych:  easily distracted.  receives cell phone call during visit.     Impression & Recommendations:  Problem # 1:  uri  Problem # 2:  DIABETES MELLITUS, TYPE II (ICD-250.00) therapy limited by noncompliance.  i'll do the best i can.  Problem # 3:  depression improved  Problem # 4:  HYPERTENSION (ICD-401.9)  Medications Added to Medication List This Visit: 1)  Azithromycin 500 Mg Tabs (Azithromycin) .Marland Kitchen.. 1 qd 2)  Benzonatate 100 Mg Caps (Benzonatate) .Marland Kitchen.. 1 three times a day as needed cough  Other Orders: TLB-A1C / Hgb A1C (Glycohemoglobin) (83036-A1C) TLB-Lipid Panel (80061-LIPID) TLB-BMP (Basic Metabolic Panel-BMET) (99991111) TLB-CBC Platelet - w/Differential (85025-CBCD) TLB-Hepatic/Liver Function Pnl (80076-HEPATIC) TLB-TSH (Thyroid Stimulating Hormone) (84443-TSH) TLB-Microalbumin/Creat Ratio, Urine (82043-MALB) TLB-Udip w/ Micro (81001-URINE) TLB-IBC Pnl (Iron/FE;Transferrin) (83550-IBC) Est. Patient Level IV [id number])  Patient Instructions: 1)  zithromax 500 mg once daily. 2)  loratadine-d 3)  benzonatate 100 mg three times a day as needed cough. 4)  physical soon:  we'll recheck bp then 5)  ok to stay-off prozac 6)  i advised pt of risks of refusal of medications Prescriptions: BENZONATATE 100 MG CAPS (BENZONATATE) 1 three times a day as needed cough  #30 x 0   Entered and Authorized by:   Donavan Foil MD   Signed by:   Donavan Foil MD on 10/12/2009   Method used:   Electronically to        [facility] (retail)       [place], [place]  [address]       Ph: [phone]       Fax: [phone]   [id number] AZITHROMYCIN 500 MG TABS (AZITHROMYCIN) 1 qd  #6 x 0   Entered and Authorized by:   Donavan Foil MD   Signed by:   Donavan Foil MD on 10/12/2009   Method used:   Electronically to        [facility] (retail)       [address]       [place],   [address]       Ph: [phone]       Fax: [phone]   RxID:   [id number]

## 2011-01-21 NOTE — Letter (Signed)
Summary: Generic Letter  [facility]   [place], [place] [address]   Phone: [phone]  Fax: [phone]    01/29/2009  TICIA STEPT[address] [place], [place]  [address]  Dear Ms. INZER,   When you saw Dr. Loanne Drilling on 01/25/09 he wanted you to go down stairs for labwork.  According to our records you did'nt go downstairs for labwork.  Please at your earliest convience Dr. Loraine Grip would like for you to stop back by the lab and have bloodwork done.  Lab open form 7:30-5:30     Sincerely,     Lucy Brand,RMA/DR. Renato Shin [facility]

## 2011-01-21 NOTE — Progress Notes (Signed)
Summary:  Pneumonia shot  Phone Note Call from Patient Call back at Home Phone [phone]   Caller: [phone] Call For: Donavan Foil MD Summary of Call: per Fletcher Anon call when is she suppose to have her Pneumonia shot? Initial call taken by: Nonah Mattes,  November 10, 2008 11:24 AM  Follow-up for Phone Call        you do not need to get it anymore.  you had it 5 years ago Follow-up by: Donavan Foil MD,  November 10, 2008 12:40 PM  Additional Follow-up for Phone Call Additional follow up Details #1::        called pt to inform pt made aware Additional Follow-up by: Nonah Mattes,  November 10, 2008 1:05 PM

## 2011-01-21 NOTE — Miscellaneous (Signed)
Summary: Orders Update  Clinical Lists Changes  Orders: Added new Service order of [place] NS50 (NS50) - Signed 

## 2011-01-21 NOTE — Letter (Signed)
Summary: Care Consideration for [place] Consideration for [place]   Imported By: Phillis Knack 04/25/2010 11:02:20  _____________________________________________________________________  External Attachment:    Type:   Image     Comment:   External Document

## 2011-01-21 NOTE — Assessment & Plan Note (Signed)
Summary: fu on meds/jss   Vital Signs:  Patient Profile:   77 Years Old Female Height:     71 inches Weight:      189.0 pounds Temp:     98.3 degrees F oral Pulse rate:   65 / minute BP sitting:   130 / 68  (left arm) Cuff size:   regular  Pt. in pain?   no  Vitals Entered By: Tomma Lightning (July 27, 2008 8:12 AM)                  PCP:  Luisa Hart  Chief Complaint:  follow-up on meds.  History of Present Illness: pt says "hot flashes" x 8 mos.  says it feels like menopause despite the fact that those sxs resolved many years ago. takes bp meds as rx'ed. pt says her d is well-controlled.    Current Allergies: ! PENICILLIN ! CODEINE  Past Medical History:    Reviewed history from 06/01/2008 and no changes required:       Chronic Nonadherence        CONSTIPATION (ICD-564.00)       SCREENING COLORECTAL-CANCER (ICD-V76.51)       ARTHRITIS (ICD-716.90)       HEMORRHOIDS (ICD-455.6)       DIVERTICULOSIS, COLON (ICD-562.10)       COUGH (ICD-786.2)       OSTEOPOROSIS (ICD-733.00)       HYPERTENSION (ICD-401.9)       HYPERLIPIDEMIA, rx refused (ICD-272.4)       DIABETES MELLITUS, TYPE II (ICD-250.00)       CORONARY ARTERY DISEASE (ICD-414.00)       ANEMIA-NOS (ICD-285.9)       ALLERGIC RHINITIS (ICD-477.9)     Review of Systems  The patient denies weight loss and weight gain.     Physical Exam  General:     obese.   Lungs:     clear to auscultation.  no respiratory distress  Heart:     regular rate and rhythm, S1, S2 without murmurs, rubs, gallops, or clicks    Impression & Recommendations:  Problem # 1:  HYPERTENSION (ICD-401.9)  Problem # 2:  DIABETES MELLITUS, TYPE II (ICD-250.00)  Problem # 3:  hot flashes uncertain etiology  Medications Added to Medication List This Visit: 1)  Fluoxetine Hcl 20 Mg Caps (Fluoxetine hcl) .... Qd  Other Orders: TLB-CBC Platelet - w/Differential (85025-CBCD) TLB-IBC Pnl (Iron/FE;Transferrin)  (83550-IBC) TLB-B12 + Folate Pnl NF:800672) T-Bone Densitometry JL:[id number])   Patient Instructions: 1)  same rx for htn 2)  a1c 3)  prozac 20/d 4)  ret 3 mos   Prescriptions: FLUOXETINE HCL 20 MG  CAPS (FLUOXETINE HCL) qd  #34 x 11   Entered and Authorized by:   Donavan Foil MD   Signed by:   Donavan Foil MD on 07/27/2008   Method used:   Electronically sent to ...       [facility] [address]       [place], [place]  [address]       Ph: [phone]       Fax: [phone]   RxIDID:[id number] NIFEDIPINE 30 MG  TB24 (NIFEDIPINE) 2 tablets by mouth once daily  #34 x 11   Entered and Authorized by:   Donavan Foil MD   Signed by:   Donavan Foil  MD on 07/27/2008   Method used:   Electronically sent to ...       [facility] [address]*       [place], [place]  [address]       Ph: [phone]       Fax: [phone]   RxIDKZ:[id number] [place]   POWD (POLYETHYLENE GLYCOL 3350) one packet as needed  #34 x 11   Entered and Authorized by:   Donavan Foil MD   Signed by:   Donavan Foil MD on 07/27/2008   Method used:   Electronically sent to ...       [facility] [address]*       [place], [place]  [address]       Ph: [phone]       Fax: [phone]   RxIDZW:[id number] ONETOUCH ULTRA TEST   STRP (GLUCOSE BLOOD) use as directed  #100 x 6   Entered and Authorized by:   Donavan Foil MD   Signed by:   Donavan Foil MD on 07/27/2008   Method used:   Electronically sent to ...       [facility] [address]*       [place], [place]  [address]       Ph: [phone]       Fax: [phone]   RxIDEL:[id number] ZESTORETIC 20-12.5 MG TABS (LISINOPRIL-HYDROCHLOROTHIAZIDE) 2 tablet by mouth once daily  #68 x 11   Entered and Authorized by:   Donavan Foil MD   Signed  by:   Donavan Foil MD on 07/27/2008   Method used:   Electronically sent to ...       [facility] [address]*       [place], [place]  [address]       Ph: [phone]       Fax: [phone]   RxIDRY:[id number] METFORMIN HCL 1000 MG TABS (METFORMIN HCL) one tablet by mouth two times a day  #68 x 11   Entered and Authorized by:   Donavan Foil MD   Signed by:   Donavan Foil MD on 07/27/2008   Method used:   Electronically sent to ...       [facility] [address]*       [place], [place]  [address]       Ph: [phone]       Fax: [phone]   RxID:   [id number] KLOR-CON M20 20 MEQ TBCR (POTASSIUM CHLORIDE CRYS CR) Take 1 tablet by mouth every morning  #68 Tablet x 11   Entered and Authorized by:   Donavan Foil MD   Signed by:   Donavan Foil MD on 07/27/2008   Method used:   Electronically sent to ...       [facility]. [phone]*       [place], [place]  [address]       Ph: [phone]       Fax: [phone]   RxIDEE:[id number] COZAAR 100 MG TABS (LOSARTAN POTASSIUM) one tablet by mouth once daily  #34 Tablet x 11   Entered and Authorized by:   Donavan Foil MD   Signed by:   Donavan Foil MD on 07/27/2008   Method used:   Electronically sent to ...       [facility]. [address]*       [place], [place]  [address]       Ph: [phone]       Fax: [phone]   RxIDVZ:[id number] ACTOS 45 MG TABS (PIOGLITAZONE HCL) one tablet by mouth once daily  #34 x 11   Entered and Authorized by:   Donavan Foil MD   Signed by:   Donavan Foil MD on 07/27/2008   Method used:   Electronically sent to ...       [facility]. [address]*       [address]       [place], [place]  [address]       Ph: [phone]       Fax: [phone]   RxID:    [id number]  ]  Preventive Care Screening  Bone Density:    Date:  06/18/2005    Results:  abnormal std dev     refuses mammography

## 2011-01-21 NOTE — Assessment & Plan Note (Signed)
Summary: PHYSICAL--PER PT [employer]--STC   Vital Signs:  Patient profile:   77 year old female Height:      71 inches (180.34 cm) Weight:      181.25 pounds (82.39 kg) O2 Sat:      97 % on Room air Temp:     97.0 degrees F (36.11 degrees C) oral Pulse rate:   73 / minute BP sitting:   172 / 62  (left arm) Cuff size:   large  Vitals Entered By: Gardenia Phlegm CMA (October 25, 2009 8:37 AM)  O2 Flow:  Room air CC: physical/ flu vax/ pt states she is no longer taking Azithromycin/ CF Is Patient Diabetic? Yes   Primary Jalan Fariss:  Luisa Hart  CC:  physical/ flu vax/ pt states she is no longer taking Azithromycin/ CF.  History of Present Illness: here for regular wellness examination.  she's feeling pretty well in general, and does not drink or smoke.   Current Medications (verified): 1)  Cozaar 100 Mg Tabs (Losartan Potassium) .... One Tablet By Mouth Once Daily 2)  Klor-Con M20 20 Meq Tbcr (Potassium Chloride Crys Cr) .... Take 1 Tablet By Mouth Every Morning 3)  Metformin Hcl 1000 Mg Tabs (Metformin Hcl) .... One Tablet By Mouth Two Times A Day 4)  Zestoretic 20-12.5 Mg Tabs (Lisinopril-Hydrochlorothiazide) .... 2 Tablet By Mouth Once Daily 5)  Onetouch Ultra Test   Strp (Glucose Blood) .... Use As Directed 6)  Aspirin 81 Mg  Tabs (Aspirin) .... One Tablet By Mouth Once Daily 7)  Miralax   Powd (Polyethylene Glycol 3350) .... One Packet As Needed 8)  Nifedipine 30 Mg  Tb24 (Nifedipine) .... 2 Tablets By Mouth Once Daily 9)  Azithromycin 500 Mg Tabs (Azithromycin) .Marland Kitchen.. 1 Qd 10)  Benzonatate 100 Mg Caps (Benzonatate) .Marland Kitchen.. 1 Three Times A Day As Needed Cough  Allergies (verified): 1)  ! Penicillin 2)  ! Codeine  Family History: Reviewed history from 06/01/2008 and no changes required. Family History of Pancreatic Cancer:Mother Family History of Breast Cancer:Aunt Family History of Diabetes: Brother,Sister,Father Family History of Heart Disease: Father No FH of Colon  Cancer:  Social History: Reviewed history from 06/01/2008 and no changes required. Widowed Occupation- custodian  Patient has never smoked.  Alcohol Use - no Illicit Drug Use - no  Review of Systems  The patient denies fever, weight loss, weight gain, vision loss, decreased hearing, chest pain, syncope, prolonged cough, headaches, abdominal pain, melena, hematochezia, severe indigestion/heartburn, hematuria, and suspicious skin lesions.         depression is much better  Physical Exam  General:  Well developed, well nourished, in no acute distress.  Head:  head: no deformity eyes: no periorbital swelling, no proptosis external nose and ears are normal mouth: no lesion seen Neck:  Supple without thyroid enlargement or tenderness. No cervical lymphadenopathy, neck masses or tracheal deviation.  Breasts:  No tenderness, masses, nipple discharge, or skin abnormalities.  Heart:  Regular rate and rhythm without murmurs or gallops noted. Normal S1,S2.   Abdomen:  abdomen is soft, nontender.  no hepatosplenomegaly.   not distended.  no hernia  Rectal:  normal external and internal exam.  heme neg  Msk:  muscle bulk and strength are grossly normal.  no obvious joint swelling.  gait is normal and steady  Extremities:  no deformity.  no ulcer on the feet.  feet are of normal color and temp.  no edema  Neurologic:  cn 2-12 grossly intact.  readily moves all 4's.   sensation is intact to touch on the feet  Skin:  normal texture and temp.  no rash.  not diaphoretic  Cervical Nodes:  No significant adenopathy.  Psych:  Alert and cooperative; normal mood and affect; normal attention span and concentration.   Additional Exam:  SEPARATE EVALUATION FOLLOWS--EACH PROBLEM HERE IS NEW, NOT RESPONDING TO TREATMENT, OR POSES SIGNIFICANT RISK TO THE PATIENT'S HEALTH: HISTORY OF THE PRESENT ILLNESS ys "i have a tickle in my throat." pt again refuses insulin pt stopped actos due to fear of  side-effects. she takes bp meds as rx'ed PAST MEDICAL HISTORY reviewed and up to date today REVIEW OF SYSTEMS: denies hypoglycemia and sob PHYSICAL EXAMINATION: clear to auscultation.  no respiratory distress see vs page no deformity.  no ulcer on the feet.  feet are of normal color and temp.  no edema.  there is bilateral onychomycosis LAB/XRAY RESULTS: Hemoglobin A1C       [H]  10.2 % IMPRESSION: dm, therapy limited by noncompliance.  i'll do the best i can. dyslipidemia, needs increased rx cough due to acei htn, needs increased rx PLAN: stop zestoretic change cozaar to hyzaar 100/25, 1/day add zocor 40 mg at bedtime add amaryl 4 mg bid   Impression & Recommendations:  Problem # 1:  ROUTINE GENERAL MEDICAL EXAM@HEALTH  CARE FACL (ICD-V70.0)  Medications Added to Medication List This Visit: 1)  Glimepiride 4 Mg Tabs (Glimepiride) .Marland Kitchen.. 1 bid 2)  Losartan Potassium-hctz 100-25 Mg Tabs (Losartan potassium-hctz) .Marland Kitchen.. 1 qd 3)  Simvastatin 40 Mg Tabs (Simvastatin) .Marland Kitchen.. 1 qhs 4)  Screening Mammogram   Other Orders: Admin 1st Vaccine [id number]) Flu Vaccine 44yrs + QO:2754949) EKG w/ Interpretation (93000) Est. Patient Level IV VM:3506324) Est. Patient 65& > TD:5803408) Flu Vaccine Consent Questions     Do you have a history of severe allergic reactions to this vaccine? no    Any prior history of allergic reactions to egg and/or gelatin? no    Do you have a sensitivity to the preservative Thimersol? no    Do you have a past history of Guillan-Barre Syndrome? no    Do you currently have an acute febrile illness? no    Have you ever had a severe reaction to latex? no    Vaccine information given and explained to patient? yes    Are you currently pregnant? no    Lot Number:[id number]   Exp Date:06/20/2010   Site Given  Left Deltoid IM Flu Vaccine 62yrs + QO:2754949)  Patient Instructions: 1)  same metformin 2)  add glimepiride 4 mg two times a day 3)  change lisinopril and cozaar to hyzaar  100/25, 1/day 4)  Please schedule a follow-up appointment in 1 month. 5)  add simvastatin 40 mg at night 6)  we discussed the recommendations of the preventive services task force Prescriptions: SCREENING MAMMOGRAM   #0 x 0   Entered and Authorized by:   Donavan Foil MD   Signed by:   Donavan Foil MD on 10/25/2009   Method used:   Print then Give to Patient   RxID:   [id number] SIMVASTATIN 40 MG TABS (SIMVASTATIN) 1 qhs  #30 x 11   Entered and Authorized by:   Donavan Foil MD   Signed by:   Donavan Foil MD on 10/25/2009   Method used:   Electronically to        [facility] [address] (retail)  [place], [place]  [address]       Ph: [phone]       Fax: [phone]   RxID:   [id number] LOSARTAN POTASSIUM-HCTZ 100-25 MG TABS (LOSARTAN POTASSIUM-HCTZ) 1 qd  #30 x 11   Entered and Authorized by:   Donavan Foil MD   Signed by:   Donavan Foil MD on 10/25/2009   Method used:   Electronically to        [facility]. [address] (retail)       [address]       [place], [place]  [address]       Ph: [phone]       Fax: [phone]   RxIDZC:[id number] GLIMEPIRIDE 4 MG TABS (GLIMEPIRIDE) 1 bid  #60 x 11   Entered and Authorized by:   Donavan Foil MD   Signed by:   Donavan Foil MD on 10/25/2009   Method used:   Electronically to        [facility]. [address] (retail)       [address]       [place], [place]  [address]       Ph: [phone]       Fax: [phone]   RxIDNW:[id number]  .lbflu

## 2011-01-21 NOTE — Procedures (Signed)
Summary: Colonoscopy   Colonoscopy  Procedure date:  07/21/2008  Findings:      Location:  [facility].    Procedures Next Due Date:    Colonoscopy: 07/2013  Patient Name: Krista Rosales, Krista Rosales MRN:  Procedure Procedures: Colonoscopy CPT: H7044205.    with polypectomy. CPT: S2983155.  Personnel: Endoscopist: Docia Chuck. Henrene Pastor, MD.  Exam Location: Exam performed in Outpatient Clinic. Outpatient  Patient Consent: Procedure, Alternatives, Risks and Benefits discussed, consent obtained, from patient. Consent was obtained by the RN.  Indications  Average Risk Screening Routine.  History  Current Medications: Patient is not currently taking Coumadin.  Pre-Exam Physical: Performed Jul 21, 2008. Cardio-pulmonary exam, Rectal exam, HEENT exam , Abdominal exam, Mental status exam WNL.  Comments: Pt. history reviewed/updated, physical exam performed prior to initiation of sedation?yes Exam Exam: Extent of exam reached: Cecum, extent intended: Cecum.  The cecum was identified by appendiceal orifice and IC valve. Patient position: on left side. Time to Cecum: 00:05:30. Time for Withdrawl: 00:08:26. ASA Classification: II. Tolerance: excellent.  Monitoring: Pulse and BP monitoring, Oximetry used. Supplemental O2 given.  Colon Prep Used Miralax for colon prep. Prep results: excellent.  Sedation Meds: Patient assessed and found to be appropriate for moderate (conscious) sedation. Fentanyl 50 mcg. given IV. Versed 6 mg. given IV.  Findings POLYP: Ascending Colon, Maximum size: 2 mm. Procedure:  snare without cautery, removed, retrieved, Polyp sent to pathology. ICD9: Colon Polyps: 211.3.  - DIVERTICULOSIS: Ascending Colon to Sigmoid Colon. ICD9: Diverticulosis, Colon: 562.10. Comments: marked changes.  POLYP: Ascending Colon, Maximum size: 3 mm. Procedure:  snare without cautery, removed, retrieved, sent to pathology. ICD9: Colon Polyps: 211.3.  NORMAL EXAM: Cecum.  NORMAL  EXAM: Rectum.   Assessment  Diagnoses: 211.3: Colon Polyps. x 2.  562.10: Diverticulosis, Colon.   Events  Unplanned Interventions: No intervention was required.  Unplanned Events: There were no complications. Plans Disposition: After procedure patient sent to recovery. After recovery patient sent home.  Scheduling/Referral: Colonoscopy, to Docia Chuck. Henrene Pastor, MD, in 5 years,     cc.   [facility] [address] [place], [place] [address] Telephone [phone] or [phone] Fax [phone]   REPORT OF SURGICAL PATHOLOGY   Case #: [id number] Patient Name: Krista Rosales. Office Chart Number:  [id number]   MRN: [id number] Pathologist: Violet Baldy, MD DOB/Age  Jan 15, 1934 (Age: 77)    Gender: F Date Taken:  07/21/2008 Date Received: 07/22/2008   FINAL DIAGNOSIS   ***MICROSCOPIC EXAMINATION AND DIAGNOSIS***   ASCENDING COLON, POLYPS:  - ADENOMATOUS AND HYPERPLASTIC POLYPS.  - NO HIGH GRADE DYSPLASIA OR INVASIVE MALIGNANCY IDENTIFIED.   cc Date Reported:  07/24/2008     Violet Baldy, MD *** Electronically Signed Out By BNS ***   Clinical information Screening, R/O adenoma  (tw)   specimen(s) obtained Colon, polyp(s), ascending   Gross Description Received in formalin are tan, soft tissue fragments that are submitted in toto.  Number: 3 Size: vary from 0.4 to 0.6 x 0.3 cm. Block Summary: toto, one cassette.   (bm:tw 07/22/08)    tw/     Signed by Irene Shipper MD on 07/30/2008 at 11:22 PM  ________________________________________________________________________ RECALL COLONOSCOPY IN 5 YEARS   Signed by Irene Shipper MD on 07/30/2008 at 11:22 PM  July 30, 2008 MRN: [id number]    [facility],   [address]    Dear Ms.  Deutscher,  I am pleased to inform you that the colon polyp(s) removed during your recent colonoscopy was (were) found to be benign  (no cancer detected) upon pathologic examination.  I recommend you have a repeat colonoscopy examination in 5 years to look for recurrent polyps, as having colon polyps increases your risk for having recurrent polyps or even colon cancer in the future.  Should you develop new or worsening symptoms of abdominal pain, bowel habit changes or bleeding from the rectum or bowels, please schedule an evaluation with either your primary care physician or with me.  Additional information/recommendations:  _X_ No further action with gastroenterology is needed at this time. Please      follow-up with your primary care physician for your other healthcare      needs.   Please call us if you are having persistent problems or have questions about your condition that have not been fully answered at this time.  Sincerely,  Irene Shipper MD  This letter has been electronically signed by your physician.   Signed by Irene Shipper MD on 07/30/2008 at 11:23 PM  ________________________________________________________________________   This report was created from the original endoscopy report, which was reviewed and signed by the above listed endoscopist.

## 2011-01-21 NOTE — Progress Notes (Signed)
Summary: cozaar  Phone Note Refill Request Message from:  Pharmacy on September 28, 2008 12:17 PM  Refills Requested: Medication #1:  COZAAR 100 MG TABS one tablet by mouth once daily   Last Refilled: 08/22/2008  Method Requested: Electronic Initial call taken by: Tomma Lightning,  September 28, 2008 12:17 PM      Prescriptions: COZAAR 100 MG TABS (LOSARTAN POTASSIUM) one tablet by mouth once daily  #34 Tablet x 10   Entered by:   Tomma Lightning   Authorized by:   Donavan Foil MD   Signed by:   Tomma Lightning on 09/28/2008   Method used:   Electronically to        [facility] [address] (retail)       [address]       [place], [place]  [address]       Ph: [phone]       Fax: [phone]   RxID:   [id number]

## 2011-01-21 NOTE — Letter (Signed)
Summary: Generic Letter  [facility]  [address] [place], [place] [address]   Phone: [phone]  Fax: [phone]    02/03/2008  Krista Rosales [place], [place]  [address]  Dear [name],  because of a medical condition, you cannot wear a backpack.   Sincerely,   Renato Shin MD [facility]

## 2011-01-21 NOTE — Assessment & Plan Note (Signed)
Summary: FU-STC   Vital Signs:  Patient Profile:   77 Years Old Female Weight:      182.3 pounds Temp:     98.2 degrees F oral Pulse rate:   85 / minute BP sitting:   162 / 68  (left arm) Cuff size:   regular  Vitals Entered By: Tomma Lightning (February 03, 2008 8:12 AM)                 Visit Type:  Follow-up Visit  Chief Complaint:  bp.  History of Present Illness: says she takes 3 bp meds as rx'ed, but doesn't know what meds she takes. pt has recurrent sebaceuos cyst at ant left shoulder.  says she can't wear a backpack.  has upcoming appt in [place] for this. 10 days dry cough    Past Medical History:    Reviewed history from 07/16/2007 and no changes required:       Allergic rhinitis       Anemia-NOS       Coronary artery disease       Diabetes mellitus, type II       Hyperlipidemia       Hypertension       Osteoporosis       Dyslipidemai ( Rx refused)       Spinal OA       Carotid Stenosis       Chronic Nonadherence     Review of Systems  The patient denies fever.         uncertain if she has sob   Physical Exam  General:     obese.   Lungs:     clear to auscultation  Extremities:     no edema  Skin:     minimally swollen sebaceuos cyst ant aspect left shoulder Psych:     very distracted during visit.  goes from one subject to the other, and answering cell phone several times.    Impression & Recommendations:  Problem # 1:  HYPERTENSION (ICD-401.9)  The following medications were removed from the medication list:    Procardia Xl 30 Mg Tb24 (Nifedipine)  Her updated medication list for this problem includes:    Cozaar 100 Mg Tabs (Losartan potassium)    Zestoretic 20-12.5 Mg Tabs (Lisinopril-hydrochlorothiazide)    Norvasc 10 Mg Tabs (Amlodipine besylate) ..... Qd  Orders: Est. Patient Level IV [id number])   Problem # 2:  COUGH (ICD-786.2)  Orders: T-2 View CXR, Same Day (C9260230.5TC)   Problem # 3:  sebaceous cyst  Medications  Added to Medication List This Visit: 1)  Norvasc 10 Mg Tabs (Amlodipine besylate) .... Qd   Patient Instructions: 1)  change nifedipine to norvasc 10/d 2)  always bring med bottles to dr appts for your safety 3)  check cxr 4)  ret 30d    Prescriptions: NORVASC 10 MG  TABS (AMLODIPINE BESYLATE) qd  #30 x 11   Entered and Authorized by:   Donavan Foil MD   Signed by:   Donavan Foil MD on 02/03/2008   Method used:   Electronically sent to ...       [facility] [address]       [address]       [place], [place]  [address]       Ph: [phone]       Fax: [phone]   RxID:   [id number]  Check  o2 = 98]

## 2011-01-23 NOTE — Letter (Signed)
Summary: Generic Letter  [facility]   [place], [place] [address]   Phone: [phone]  Fax: [phone]    12/25/2010  BREHANA SCHMUCK [place], [place]  [address]  Dear Ms. LOZZI,   Per our records, you haven't seen Dr. Loanne Drilling for an office visit since 11/2009. In order for Dr. Loanne Drilling to continue to manage your care, you must schedule an office visit for a physical, otherwise Dr. Loanne Drilling will be unable to continue to be your provider and you could be discharged from the practice. Please contact us during normal business hours M-F 8am-5pm at [phone] to schedule an appointment. If you have any other questions or concerns, please feel free to contact our office at the number listed above.        Sincerely,   Rebeca Alert CMA ([place])

## 2011-05-09 NOTE — Discharge Summary (Signed)
[place]  Patient:    Krista Rosales, Krista Rosales                     MRN: [id number] Adm. Date:  [id number] Disc. Date: [id number] Attending:  Danny Lawless Dictator:   Helayne Seminole, P.A.                           Discharge Summary  DISCHARGE DIAGNOSES: 1. Elevated troponin I. 2. Congestive heart failure. 3. Malignant hypertension. 4. Elevated liver function tests. 5. Cholelithiasis. 6. Hyperglycemia. 7. Hypokalemia.  BRIEF ADMISSION HISTORY:  The patient is a 77 year old African-American female presented in the ER with nausea and vomiting associated with anxiety.  She complained of weakness and shortness of breath.  No prior cardiac history but a 10-year-history of diabetes and hypertension.  ER evaluation revealed a troponin of 0.13.  EKG showed sinus tachycardia, but no acute changes.  PAST MEDICAL HISTORY: 1. Status post hysterectomy. 2. Status post knee surgery. 3. Diabetes mellitus. 4. Hypertension.  HOSPITAL COURSE: #1 - Cardiovascular.  The patient presented with elevated cardiac enzymes, therefore, we did ask for cardiology to see the patient.  Given the patients multiple risk factors and elevated troponins and a systolic ejection murmur on exam, the patient was scheduled for a cardiac catheterization.  The patient had a cath on 12/04/00 that revealed a 50-70% stenosis of the LAD.  Circumflex was okay, the RCA was okay.  She had mild global hypokinesis with severe right heart pressure and preserved LE function with a question of hypertensive cardiovascular disease.  No further cardiac interventions were perceived.  #2 - Malignant hypertension.  This was brought into better control with antihypertensive.  #3 - Gastrointestinal.  The patient had elevated LFTs, however, these began normalizing.  Ultrasound was consistent with sludge with tiny cholelithiasis and no wall thickening.  A hepatobiliary scan was not pursued.  #4 -  Hypokalemia.  Her potassium levels were corrected.  DISCHARGE LABORATORY DATA:  White count was 6.2, hemoglobin 10.8, hematocrit 32.1, coags were normal, potassium was 4.7, ______ , total bilirubin was 1.8, hemoglobin A1c was 7.4%.  TSH was 4.339.  DISCHARGE MEDICATIONS: 1. Glucophage 1000 mg b.i.d. 2. Actos 45 mg q.d. 3. Accupril 40 mg q.d. 4. Toprol XL 100 mg q.d. 5. Diovan 160 mg q.d. 6. Isordil 40 mg t.i.d. 7. Lasix 20 mg q.d. 8. K-Dur 20 mEq q.d.  FOLLOWUP:  The patient was to follow up with Dr. Loanne Drilling in one to two weeks and Dr. Johnsie Cancel in two weeks. DD:  02/17/01 TD:  02/17/01 Job: [id number]

## 2011-05-09 NOTE — H&P (Signed)
[place]  Patient:    Krista Rosales, Krista Rosales                     MRN: [id number] Adm. Date:  [id number] Attending:  Nyoka Cowden                         History and Physical  CHIEF COMPLAINT: Abdominal pain, nausea and vomiting.  HISTORY OF PRESENT ILLNESS: The patient is a 77 year old black female who was stable until the day of admission, when she had onset of nausea and vomiting associated with some anxiety, weakness, and shortness of breath.  Due to these worsening symptoms she presented to the emergency room for evaluation.  On arrival in the emergency room her vital signs were fairly stable except for mild hypertension and tachycardia, and the symptoms resolved fairly promptly without intervention.  Laboratory evaluation included a troponin level that was three times elevated at 0.13.  Electrocardiogram revealed sinus tachycardia with left axis deviation but no acute changes.  The patient has no known cardiac disease, but does have a ten year history of hypertension and diabetes.  The patient is now admitted to exclude an acute coronary syndrome.  PAST MEDICAL HISTORY:  1. The patient is gravida 1 para 2 abortus 2.  2. She is status post hysterectomy in 61.  3. She is status post open knee surgery in 1975.  4. She has a ten year history of both diabetes and hypertension.  CURRENT MEDICATIONS:  1. Glucophage 1 g b.i.d.  2. Actos 30 mg q.d.  3. Accupril 40 mg q.d.  4. Toprol XL 100 mg q.d.  5. Diovan 160 mg q.d.  SOCIAL HISTORY: The patient is a widow.  Her husband died of esophageal cancer.  She has no children.  She does have a number of siblings in the area.  FAMILY HISTORY: The patients father died at the age of 9 of cardiac complications.  Her mother died at the age of 67 of pancreatic cancer.  The family history is strongly positive for both diabetes and hypertension.  REVIEW OF SYSTEMS: The Review Of Systems is  otherwise fairly noncontributory except for the onset of her acute illness.  The patient denies any history of exertional chest pain, and denies any known cardiac disease.  She denies any known history of GI disorder such as peptic ulcer disease, cholelithiasis.  PHYSICAL EXAMINATION:  VITAL SIGNS: Blood pressure 190/100, pulse rate 104.  GENERAL: The patient is an elderly black female, who is alert and cooperative and in no acute distress.  SKIN: Unremarkable without rash.  HEENT: Normal pupillary responses.  Conjunctivae clear.  Sclerae anicteric. Ears, nose, and throat unremarkable.  Few scattered dentition present in the lower plate only.  NECK: No bruits or adenopathy.  No neck vein distention.  CHEST: Few bibasilar crackles noted.  CARDIOVASCULAR: Tachycardia with rate of 104.  A gallop rhythm was thought to be present as well as a grade 2/6 systolic murmur.  ABDOMEN: Lower abdominal scar noted.  No tenderness or distention.  Bowel sounds were active.  GU: Normal external genitalia.  RECTAL: Normal appearing stool, which was Hemoccult negative.  EXTREMITIES: No edema.  Dorsalis pedis pulses were full.  Posterior tibial pulses were not easily palpable.  NEUROLOGIC: Negative examination.  LABORATORY DATA: SGOT was elevated at 125 and SGPT was elevated at 88.  WBC 11.6.  IMPRESSION:  1. Rule out acute coronary syndrome.  2. Rule out cholelithiasis.  3. Hypertension.  4. Diabetes.  PLAN: The patient will be admitted to telemetry setting and serial cardiac enzymes will be obtained as well as serial EKGs.  The patient will be maintained on her preadmission medications to include Toprol and Accupril. Serial blood sugars will be monitored and covered with a sliding-scale regimen of Humulin Regular.  She will be maintained on her Actos.  Glucophage will be held.  The patient will have a 2D echocardiogram and a gallbladder ultrasound obtained. DD:  12/03/00 TD:   12/03/00 Job: [id number]

## 2011-09-19 LAB — GLUCOSE, CAPILLARY
Glucose-Capillary: 181 — ABNORMAL HIGH
Glucose-Capillary: 218 — ABNORMAL HIGH

## 2013-06-27 ENCOUNTER — Encounter: Payer: Self-pay | Admitting: Internal Medicine

## 2014-05-31 ENCOUNTER — Encounter: Payer: Self-pay | Admitting: Internal Medicine

## 2015-02-20 ENCOUNTER — Emergency Department (HOSPITAL_COMMUNITY): Payer: Medicare Other

## 2015-02-20 ENCOUNTER — Inpatient Hospital Stay (HOSPITAL_COMMUNITY)
Admission: EM | Admit: 2015-02-20 | Discharge: 2015-02-23 | DRG: 305 | Disposition: A | Discharge status: Home Health | Payer: Medicare Other | Attending: Internal Medicine | Admitting: Internal Medicine

## 2015-02-20 ENCOUNTER — Encounter (HOSPITAL_COMMUNITY): Payer: Self-pay | Admitting: Emergency Medicine

## 2015-02-20 DIAGNOSIS — Z87891 Personal history of nicotine dependence: Secondary | ICD-10-CM

## 2015-02-20 DIAGNOSIS — R51 Headache: Secondary | ICD-10-CM | POA: Diagnosis not present

## 2015-02-20 DIAGNOSIS — I160 Hypertensive urgency: Secondary | ICD-10-CM

## 2015-02-20 DIAGNOSIS — D649 Anemia, unspecified: Secondary | ICD-10-CM | POA: Diagnosis present

## 2015-02-20 DIAGNOSIS — R001 Bradycardia, unspecified: Secondary | ICD-10-CM | POA: Diagnosis present

## 2015-02-20 DIAGNOSIS — M810 Age-related osteoporosis without current pathological fracture: Secondary | ICD-10-CM | POA: Diagnosis present

## 2015-02-20 DIAGNOSIS — I509 Heart failure, unspecified: Secondary | ICD-10-CM | POA: Diagnosis not present

## 2015-02-20 DIAGNOSIS — J449 Chronic obstructive pulmonary disease, unspecified: Secondary | ICD-10-CM | POA: Diagnosis present

## 2015-02-20 DIAGNOSIS — I2510 Atherosclerotic heart disease of native coronary artery without angina pectoris: Secondary | ICD-10-CM | POA: Diagnosis present

## 2015-02-20 DIAGNOSIS — Z79899 Other long term (current) drug therapy: Secondary | ICD-10-CM

## 2015-02-20 DIAGNOSIS — E876 Hypokalemia: Secondary | ICD-10-CM | POA: Diagnosis present

## 2015-02-20 DIAGNOSIS — M1990 Unspecified osteoarthritis, unspecified site: Secondary | ICD-10-CM | POA: Diagnosis present

## 2015-02-20 DIAGNOSIS — R519 Headache, unspecified: Secondary | ICD-10-CM

## 2015-02-20 DIAGNOSIS — I5032 Chronic diastolic (congestive) heart failure: Secondary | ICD-10-CM | POA: Diagnosis present

## 2015-02-20 DIAGNOSIS — I10 Essential (primary) hypertension: Secondary | ICD-10-CM | POA: Diagnosis present

## 2015-02-20 DIAGNOSIS — Z9841 Cataract extraction status, right eye: Secondary | ICD-10-CM

## 2015-02-20 DIAGNOSIS — H409 Unspecified glaucoma: Secondary | ICD-10-CM | POA: Diagnosis present

## 2015-02-20 DIAGNOSIS — E118 Type 2 diabetes mellitus with unspecified complications: Secondary | ICD-10-CM | POA: Diagnosis present

## 2015-02-20 DIAGNOSIS — E785 Hyperlipidemia, unspecified: Secondary | ICD-10-CM | POA: Diagnosis present

## 2015-02-20 DIAGNOSIS — J438 Other emphysema: Secondary | ICD-10-CM | POA: Diagnosis present

## 2015-02-20 DIAGNOSIS — E1165 Type 2 diabetes mellitus with hyperglycemia: Secondary | ICD-10-CM | POA: Diagnosis present

## 2015-02-20 DIAGNOSIS — IMO0002 Reserved for concepts with insufficient information to code with codable children: Secondary | ICD-10-CM | POA: Diagnosis present

## 2015-02-20 HISTORY — DX: Type 2 diabetes mellitus without complications: E11.9

## 2015-02-20 HISTORY — DX: Arthropathy, unspecified: M12.9

## 2015-02-20 HISTORY — DX: Diverticulosis of large intestine without perforation or abscess without bleeding: K57.30

## 2015-02-20 HISTORY — DX: Unspecified glaucoma: H40.9

## 2015-02-20 HISTORY — DX: Iron deficiency anemia, unspecified: D50.9

## 2015-02-20 HISTORY — DX: Hyperlipidemia, unspecified: E78.5

## 2015-02-20 HISTORY — DX: Essential (primary) hypertension: I10

## 2015-02-20 LAB — CBC WITH DIFFERENTIAL/PLATELET
Basophils Absolute: 0 10*3/uL (ref 0.0–0.1)
Basophils Relative: 0 % (ref 0–1)
Eosinophils Absolute: 0.1 10*3/uL (ref 0.0–0.7)
Eosinophils Relative: 2 % (ref 0–5)
HCT: 36.5 % (ref 36.0–46.0)
Hemoglobin: 12 g/dL (ref 12.0–15.0)
Lymphocytes Relative: 23 % (ref 12–46)
Lymphs Abs: 1.5 10*3/uL (ref 0.7–4.0)
MCH: 27.4 pg (ref 26.0–34.0)
MCHC: 32.9 g/dL (ref 30.0–36.0)
MCV: 83.3 fL (ref 78.0–100.0)
Monocytes Absolute: 0.6 10*3/uL (ref 0.1–1.0)
Monocytes Relative: 9 % (ref 3–12)
Neutro Abs: 4.3 10*3/uL (ref 1.7–7.7)
Neutrophils Relative %: 66 % (ref 43–77)
Platelets: 346 10*3/uL (ref 150–400)
RBC: 4.38 MIL/uL (ref 3.87–5.11)
RDW: 14.5 % (ref 11.5–15.5)
WBC: 6.6 10*3/uL (ref 4.0–10.5)

## 2015-02-20 LAB — URINALYSIS, ROUTINE W REFLEX MICROSCOPIC
Bilirubin Urine: NEGATIVE
Glucose, UA: 100 mg/dL — AB
Hgb urine dipstick: NEGATIVE
Ketones, ur: NEGATIVE mg/dL
Leukocytes, UA: NEGATIVE
Nitrite: NEGATIVE
Protein, ur: >300 mg/dL — AB
Specific Gravity, Urine: 1.017 (ref 1.005–1.030)
Urobilinogen, UA: 0.2 mg/dL (ref 0.0–1.0)
pH: 6 (ref 5.0–8.0)

## 2015-02-20 LAB — I-STAT TROPONIN, ED: Troponin i, poc: 0.02 ng/mL (ref 0.00–0.08)

## 2015-02-20 LAB — COMPREHENSIVE METABOLIC PANEL
ALT: 12 U/L (ref 0–35)
AST: 23 U/L (ref 0–37)
Albumin: 4 g/dL (ref 3.5–5.2)
Alkaline Phosphatase: 32 U/L — ABNORMAL LOW (ref 39–117)
Anion gap: 7 (ref 5–15)
BUN: 15 mg/dL (ref 6–23)
CO2: 31 mmol/L (ref 19–32)
Calcium: 8.8 mg/dL (ref 8.4–10.5)
Chloride: 103 mmol/L (ref 96–112)
Creatinine, Ser: 1.08 mg/dL (ref 0.50–1.10)
GFR calc Af Amer: 55 mL/min — ABNORMAL LOW (ref >90)
GFR calc non Af Amer: 48 mL/min — ABNORMAL LOW (ref >90)
Glucose, Bld: 141 mg/dL — ABNORMAL HIGH (ref 70–99)
Potassium: 3.5 mmol/L (ref 3.5–5.1)
Sodium: 141 mmol/L (ref 135–145)
Total Bilirubin: 0.4 mg/dL (ref 0.3–1.2)
Total Protein: 8.4 g/dL — ABNORMAL HIGH (ref 6.0–8.3)

## 2015-02-20 LAB — TROPONIN I
Troponin I: <0.03 ng/mL (ref <0.031)
Troponin I: 0.03 ng/mL (ref <0.031)

## 2015-02-20 LAB — GLUCOSE, CAPILLARY
Glucose-Capillary: 195 mg/dL — ABNORMAL HIGH (ref 70–99)
Glucose-Capillary: 153 mg/dL — ABNORMAL HIGH (ref 70–99)

## 2015-02-20 LAB — MRSA PCR SCREENING: MRSA by PCR: NEGATIVE

## 2015-02-20 LAB — URINE MICROSCOPIC-ADD ON

## 2015-02-20 MED ORDER — ASPIRIN EC 81 MG PO TBEC
81.0000 mg | DELAYED_RELEASE_TABLET | Freq: Every day | ORAL | Status: DC
  Administered 2015-02-21 – 2015-02-23 (×3): 81 mg via ORAL
  Filled 2015-02-20 (×3): qty 1

## 2015-02-20 MED ORDER — LISINOPRIL 40 MG PO TABS
40.0000 mg | ORAL_TABLET | Freq: Every day | ORAL | Status: DC
Start: 2015-02-21 — End: 2015-02-23
  Administered 2015-02-21 – 2015-02-23 (×3): 40 mg via ORAL
  Filled 2015-02-20 (×3): qty 1

## 2015-02-20 MED ORDER — INSULIN ASPART 100 UNIT/ML ~~LOC~~ SOLN
0.0000 [IU] | Freq: Three times a day (TID) | SUBCUTANEOUS | Status: DC
  Administered 2015-02-21: 2 [IU] via SUBCUTANEOUS
  Administered 2015-02-21: 3 [IU] via SUBCUTANEOUS

## 2015-02-20 MED ORDER — NEPAFENAC 0.3 % OP SUSP
1.0000 [drp] | Freq: Every day | OPHTHALMIC | Status: DC
  Administered 2015-02-22 – 2015-02-23 (×2): 1 [drp] via OPHTHALMIC
  Filled 2015-02-20: qty 1.7

## 2015-02-20 MED ORDER — ASPIRIN 81 MG PO TABS: 81.0000 mg | ORAL_TABLET | Freq: Every day | ORAL | Status: DC

## 2015-02-20 MED ORDER — ATORVASTATIN CALCIUM 20 MG PO TABS
20.0000 mg | ORAL_TABLET | Freq: Every day | ORAL | Status: DC
  Administered 2015-02-21: 20 mg via ORAL
  Filled 2015-02-20 (×4): qty 1

## 2015-02-20 MED ORDER — HYDROCHLOROTHIAZIDE 25 MG PO TABS
25.0000 mg | ORAL_TABLET | Freq: Every day | ORAL | Status: DC
  Administered 2015-02-21 – 2015-02-23 (×3): 25 mg via ORAL
  Filled 2015-02-20 (×3): qty 1

## 2015-02-20 MED ORDER — SPIRONOLACTONE 25 MG PO TABS
25.0000 mg | ORAL_TABLET | Freq: Once | ORAL | Status: AC
  Administered 2015-02-20: 25 mg via ORAL
  Filled 2015-02-20: qty 1

## 2015-02-20 MED ORDER — HYDRALAZINE HCL 50 MG PO TABS
100.0000 mg | ORAL_TABLET | Freq: Four times a day (QID) | ORAL | Status: DC
  Administered 2015-02-20 – 2015-02-23 (×12): 100 mg via ORAL
  Filled 2015-02-20 (×15): qty 2

## 2015-02-20 MED ORDER — SENNA 8.6 MG PO TABS
1.0000 | ORAL_TABLET | Freq: Two times a day (BID) | ORAL | Status: DC
  Administered 2015-02-20 – 2015-02-23 (×3): 8.6 mg via ORAL
  Filled 2015-02-20 (×7): qty 1

## 2015-02-20 MED ORDER — ACETAMINOPHEN 325 MG PO TABS
650.0000 mg | ORAL_TABLET | Freq: Four times a day (QID) | ORAL | Status: DC | PRN
  Administered 2015-02-21 – 2015-02-22 (×2): 650 mg via ORAL
  Filled 2015-02-20 (×3): qty 2

## 2015-02-20 MED ORDER — AMLODIPINE BESYLATE 10 MG PO TABS
10.0000 mg | ORAL_TABLET | Freq: Every day | ORAL | Status: DC
  Administered 2015-02-21 – 2015-02-23 (×3): 10 mg via ORAL
  Filled 2015-02-20 (×3): qty 1

## 2015-02-20 MED ORDER — DIFLUPREDNATE 0.05 % OP EMUL
1.0000 [drp] | Freq: Three times a day (TID) | OPHTHALMIC | Status: DC
  Administered 2015-02-20 – 2015-02-23 (×7): 1 [drp] via OPHTHALMIC
  Filled 2015-02-20: qty 5

## 2015-02-20 MED ORDER — NITROGLYCERIN IN D5W 200-5 MCG/ML-% IV SOLN
0.0000 ug/min | INTRAVENOUS | Status: DC
  Administered 2015-02-20: 10 ug/min via INTRAVENOUS
  Filled 2015-02-20: qty 250

## 2015-02-20 MED ORDER — ONDANSETRON HCL 4 MG/2ML IJ SOLN: 4.0000 mg | Freq: Four times a day (QID) | INTRAMUSCULAR | Status: DC | PRN

## 2015-02-20 MED ORDER — LISINOPRIL-HYDROCHLOROTHIAZIDE 20-12.5 MG PO TABS: 2.0000 | ORAL_TABLET | Freq: Every day | ORAL | Status: DC

## 2015-02-20 MED ORDER — HYDROCHLOROTHIAZIDE 25 MG PO TABS: 25.0000 mg | ORAL_TABLET | Freq: Once | ORAL | Status: DC

## 2015-02-20 MED ORDER — NEBIVOLOL HCL 10 MG PO TABS: 10.0000 mg | ORAL_TABLET | Freq: Every day | ORAL | Status: DC

## 2015-02-20 MED ORDER — CLONIDINE HCL 0.1 MG PO TABS: 0.1000 mg | ORAL_TABLET | Freq: Once | ORAL | Status: DC

## 2015-02-20 MED ORDER — PANTOPRAZOLE SODIUM 40 MG PO TBEC
40.0000 mg | DELAYED_RELEASE_TABLET | Freq: Two times a day (BID) | ORAL | Status: DC
Start: 2015-02-20 — End: 2015-02-23
  Administered 2015-02-21 – 2015-02-23 (×5): 40 mg via ORAL
  Filled 2015-02-20 (×5): qty 1

## 2015-02-20 MED ORDER — SODIUM CHLORIDE 0.9 % IJ SOLN
3.0000 mL | Freq: Two times a day (BID) | INTRAMUSCULAR | Status: DC
  Administered 2015-02-22 – 2015-02-23 (×3): 3 mL via INTRAVENOUS

## 2015-02-20 MED ORDER — INSULIN ASPART 100 UNIT/ML ~~LOC~~ SOLN: 0.0000 [IU] | Freq: Every day | SUBCUTANEOUS | Status: DC

## 2015-02-20 MED ORDER — AMLODIPINE BESYLATE 10 MG PO TABS: 10.0000 mg | ORAL_TABLET | Freq: Every day | ORAL | Status: DC

## 2015-02-20 MED ORDER — HYDRALAZINE HCL 20 MG/ML IJ SOLN
10.0000 mg | INTRAMUSCULAR | Status: DC | PRN
  Administered 2015-02-22: 10 mg via INTRAVENOUS
  Filled 2015-02-20: qty 1

## 2015-02-20 MED ORDER — SPIRONOLACTONE 25 MG PO TABS
25.0000 mg | ORAL_TABLET | Freq: Every day | ORAL | Status: DC
  Administered 2015-02-21 – 2015-02-22 (×2): 25 mg via ORAL
  Filled 2015-02-20 (×3): qty 1

## 2015-02-20 MED ORDER — BESIFLOXACIN HCL 0.6 % OP SUSP
1.0000 [drp] | Freq: Three times a day (TID) | OPHTHALMIC | Status: DC
  Administered 2015-02-20 – 2015-02-23 (×7): 1 [drp] via OPHTHALMIC

## 2015-02-20 MED ORDER — LISINOPRIL 20 MG PO TABS: 20.0000 mg | ORAL_TABLET | Freq: Once | ORAL | Status: DC

## 2015-02-20 MED ORDER — DOCUSATE SODIUM 100 MG PO CAPS
100.0000 mg | ORAL_CAPSULE | Freq: Two times a day (BID) | ORAL | Status: DC
Start: 2015-02-20 — End: 2015-02-23
  Administered 2015-02-20 – 2015-02-23 (×4): 100 mg via ORAL
  Filled 2015-02-20 (×8): qty 1

## 2015-02-20 MED ORDER — ISOSORBIDE MONONITRATE ER 60 MG PO TB24
60.0000 mg | ORAL_TABLET | Freq: Every day | ORAL | Status: DC
  Administered 2015-02-20 – 2015-02-23 (×4): 60 mg via ORAL
  Filled 2015-02-20 (×4): qty 1

## 2015-02-20 MED ORDER — GATIFLOXACIN 0.5 % OP SOLN
1.0000 [drp] | Freq: Four times a day (QID) | OPHTHALMIC | Status: DC
  Filled 2015-02-20: qty 2.5

## 2015-02-20 MED ORDER — ONDANSETRON HCL 4 MG PO TABS: 4.0000 mg | ORAL_TABLET | Freq: Four times a day (QID) | ORAL | Status: DC | PRN

## 2015-02-20 MED ORDER — ISOSORB DINITRATE-HYDRALAZINE 20-37.5 MG PO TABS
2.0000 | ORAL_TABLET | Freq: Once | ORAL | Status: DC
  Filled 2015-02-20: qty 2

## 2015-02-20 MED ORDER — NEBIVOLOL HCL 10 MG PO TABS
10.0000 mg | ORAL_TABLET | Freq: Every day | ORAL | Status: DC
  Administered 2015-02-21: 10 mg via ORAL
  Filled 2015-02-20: qty 1

## 2015-02-20 NOTE — Progress Notes (Signed)
Called ED [facility] RN) for report.

## 2015-02-20 NOTE — Progress Notes (Signed)
rec'd from ED to 3S08 via stretcher.

## 2015-02-20 NOTE — ED Notes (Signed)
Dr. Sheran Fava at bedside.

## 2015-02-20 NOTE — H&P (Signed)
[facility] History and Physical  SUZANN BORRELLO [id number] DOB: [birth year] DOA: 02/20/2015  Referring physician:  Brent General PCP:  Renato Shin, MD   Chief Complaint:  Headache, hypertension  HPI:  The patient is a 81 y.o. year-old female with history of CAD via cardiac catheterization in 2012, HTN/HLD, possible chronic diastolic heart failure, COPD, T2DM, iron deficiency anemia, osteoarthritis, osteoporosis glaucoma who presents with headache, hypertension.  She presents from home.  The patient was last at their baseline health about two weeks ago.  The patient states that at baseline her blood pressure runs from [id number] systolic. About a week and half ago she started on some new eyedrops for her glaucoma which included a steroid, antibiotic and NSAID. No other eyedrops. Yesterday, she underwent surgery for glaucoma in her right eye. She was seen by her primary care doctor for routine follow-up and was found to have elevated blood pressure around 220/70.  She was referred to the emergency department because she was complaining of some mild 7/10 posterior head headache. She denied chest pain, shortness of breath, blurry vision other than what was expected postoperatively, slurred speech, confusion, focal numbness, weakness, tingling.  She states that she has been compliant with her medications and that she took them this morning.  She denies recent medication changes.  She has not taken any new OTC medications, including cold or allergy medications.    In the emergency department, initial blood pressure 245/58 which trended down to 180/160 with NTG gtt.  CT head was normal.  UA not yet obtained.  ECG NSR and troponin was negative.  She was bradycardic to the 40s but her HR has increased to 60s-80s.    Review of Systems:  General:  Denies fevers, chills, weight loss or gain HEENT:  Denies changes to hearing and vision, rhinorrhea, sinus congestion, sore throat CV:  Denies chest pain and  palpitations, lower extremity edema.  PULM:  Denies SOB, wheezing, cough.   GI:  Denies nausea, vomiting, constipation, diarrhea.   GU:  Denies dysuria, frequency, urgency ENDO:  Denies polyuria, polydipsia.   HEME:  Denies hematemesis, blood in stools, melena, abnormal bruising or bleeding.  LYMPH:  Denies lymphadenopathy.   MSK:  Denies arthralgias, myalgias.   DERM:  Denies skin rash or ulcer.   NEURO:  Denies focal numbness, weakness, slurred speech, confusion, facial droop.  PSYCH:  Denies anxiety and depression.    Past Medical History  Diagnosis Date  . Hypertension   . Glaucoma   . ARTHRITIS 05/30/2008    Qualifier: Diagnosis of  By: Marland Mcalpine    . CORONARY ARTERY DISEASE 07/16/2007    Qualifier: Diagnosis of  By: Marca Ancona RMA, Lucy    . DIABETES MELLITUS, TYPE II 07/16/2007    Qualifier: Diagnosis of  By: Marca Ancona RMA, Lucy    . DIVERTICULOSIS, COLON 05/30/2008    Qualifier: Diagnosis of  By: Marland Mcalpine    . HYPERLIPIDEMIA 07/16/2007    Qualifier: Diagnosis of  By: Reatha Armour, Lucy    . ANEMIA, IRON DEFICIENCY 05/07/2009    Qualifier: Diagnosis of  By: Loanne Drilling MD, Hilliard Clark A    History reviewed. No pertinent past surgical history. Social History:  reports that she has quit smoking. She does not have any smokeless tobacco history on file. She reports that she does not drink alcohol or use illicit drugs.  Allergies  Allergen Reactions  . Codeine   . Penicillins   . Wellbutrin [Bupropion]  Family History  Problem Relation Age of Onset  . Diabetes Mellitus II    . Pancreatic cancer Mother   . Heart disease Father      Prior to Admission medications   Medication Sig Start Date End Date Taking? Authorizing Provider  acetaminophen (TYLENOL) 500 MG tablet Take 500 mg by mouth every 6 (six) hours as needed.   Yes Historical Provider, MD  amLODipine (NORVASC) 10 MG tablet Take 10 mg by mouth daily.   Yes Historical Provider, MD  aspirin 81 MG tablet Take 81 mg by  mouth daily.   Yes Historical Provider, MD  Besifloxacin HCl 0.6 % SUSP Apply 1 drop to eye 3 (three) times daily. For 2 weeks   Yes Historical Provider, MD  cholecalciferol (VITAMIN D) 1000 UNITS tablet Take 1,000 Units by mouth 2 (two) times daily.   Yes Historical Provider, MD  Difluprednate 0.05 % EMUL Apply 1 drop to eye See admin instructions. 1 drop 3 times a day for 1 week, then 2 times a day for 1 week, and then 1 time a day for 1 week, then STOP   Yes Historical Provider, MD  glimepiride (AMARYL) 2 MG tablet Take 2 mg by mouth daily with breakfast.   Yes Historical Provider, MD  lisinopril-hydrochlorothiazide (PRINZIDE,ZESTORETIC) 20-12.5 MG per tablet Take 1 tablet by mouth daily.   Yes Historical Provider, MD  loratadine (CLARITIN) 10 MG tablet Take 10 mg by mouth daily.   Yes Historical Provider, MD  metFORMIN (GLUCOPHAGE) 1000 MG tablet Take 1,000 mg by mouth 2 (two) times daily with a meal.   Yes Historical Provider, MD  mometasone-formoterol (DULERA) 100-5 MCG/ACT AERO Inhale 2 puffs into the lungs 2 (two) times daily as needed for wheezing.   Yes Historical Provider, MD  nebivolol (BYSTOLIC) 10 MG tablet Take 10 mg by mouth daily.   Yes Historical Provider, MD  Nepafenac 0.3 % SUSP Apply 1 drop to eye daily. For 4 weeks   Yes Historical Provider, MD  pantoprazole (PROTONIX) 40 MG tablet Take 40 mg by mouth 3 (three) times daily.   Yes Historical Provider, MD  vitamin B-12 (CYANOCOBALAMIN) 500 MCG tablet Take 500 mcg by mouth daily.   Yes Historical Provider, MD   Physical Exam: Filed Vitals:   02/20/15 1500 02/20/15 1515 02/20/15 1535 02/20/15 1623  BP: 219/53 180/160 229/75 196/83  Pulse: 68 73 80 70  Temp:    97.9 F (36.6 C)  TempSrc:    Oral  Resp: 22 20 16 16   Height:    5\' 7"  (1.702 m)  Weight:    81.2 kg (179 lb 0.2 oz)  SpO2: 98% 98% 99% 99%     General:  Adult F, NAD, looks younger than stated age  Eyes:  PERRL, anicteric, non-injected.  ENT:  Nares clear.   OP clear, non-erythematous without plaques or exudates.  MMM.  Neck:  Supple without TM or JVD.    Lymph:  No cervical, supraclavicular, or submandibular LAD.  Cardiovascular:  RRR, normal S1, S2, without m/r/g.  2+ pulses, warm extremities  Respiratory:  CTA bilaterally without increased WOB.  Abdomen:  NABS.  Soft, ND/NT.    Skin:  No rashes or focal lesions.  Musculoskeletal:  Normal bulk and tone.  No LE edema.  Psychiatric:  A & O x 4.  Appropriate affect.  Neurologic:  CN 3-12 intact.  5/5 strength.  Sensation intact.    Labs on Admission:  Basic Metabolic Panel:  Recent Labs Lab  02/20/15 1256  NA 141  K 3.5  CL 103  CO2 31  GLUCOSE 141*  BUN 15  CREATININE 1.08  CALCIUM 8.8   Liver Function Tests:  Recent Labs Lab 02/20/15 1256  AST 23  ALT 12  ALKPHOS 32*  BILITOT 0.4  PROT 8.4*  ALBUMIN 4.0   No results for input(s): LIPASE, AMYLASE in the last 168 hours. No results for input(s): AMMONIA in the last 168 hours. CBC:  Recent Labs Lab 02/20/15 1256  WBC 6.6  NEUTROABS 4.3  HGB 12.0  HCT 36.5  MCV 83.3  PLT 346   Cardiac Enzymes: No results for input(s): CKTOTAL, CKMB, CKMBINDEX, TROPONINI in the last 168 hours.  BNP (last 3 results) No results for input(s): BNP in the last 8760 hours.  ProBNP (last 3 results) No results for input(s): PROBNP in the last 8760 hours.  CBG: No results for input(s): GLUCAP in the last 168 hours.  Radiological Exams on Admission: Ct Head Wo Contrast  02/20/2015   CLINICAL DATA:  Headache and hypertension. One day post cataract surgery  EXAM: CT HEAD WITHOUT CONTRAST  TECHNIQUE: Contiguous axial images were obtained from the base of the skull through the vertex without intravenous contrast.  COMPARISON:  None.  FINDINGS: There is mild diffuse atrophy. There is no intracranial mass, hemorrhage, extra-axial fluid collection, or midline shift. There is mild small vessel disease in the centra semiovale bilaterally.  Elsewhere gray-white compartments appear normal. No acute infarct is apparent. The bony calvarium appears intact. The mastoid air cells are clear. There has been removal of the lens from the right eye region.  IMPRESSION: Mild atrophy with patchy periventricular small vessel disease. There is no intracranial mass, hemorrhage, or acute appearing infarct.   Electronically Signed   By: Lowella Grip III M.D.   On: 02/20/2015 14:18    EKG:  Sinus bradycardia, mild flattening of T-waves in anterior leads.   Assessment/Plan Principal Problem:   Malignant hypertension Active Problems:   Diabetes mellitus type 2 with complications   Coronary atherosclerosis   Hypertension  ---  Hypertensive urgency with headache -  Admit to stepdown -  ECG stable, troponin neg -  Telemetry -  Cycle troponins -  CT head neg -  Check UA for hematuria -  Trend creatinine -  Continue max dose nebivolol, norvasc -  Increase HCTZ/lisinopril -  Add hydralazine and imdur -  Add spironolactone -  Trend potassium -  Avoid clonidine for now given bradycardia >> bradycardia appears to be asymptomatic at this time -  Consider titrating up on hydralazine or changing to minoxidil if blood pressure remaining elevated -  Renal artery [place] -  Would not do renin-ald ratio currently due to recent procedure yesterday and stress from hypertensive urgency, but will need as outpatient -  Wean off NTG as tolerated, goal MAP for next few hours should be around 80  DM type 2 with complication of CAD -  123456 -  Hold metformin and SU -  SSI  Nonobstructive CAD  -  ASA, BB, and ACEI -  Unclear why she is not on statin >> will start atorvastatin 20 mg  Chronic diastolic heart failure, currently euvolemic -  Daily weights  COPD, stable, continue dulera  Glaucoma, stable, continue eye gtts  Diet:  diabetic Access:  PIV IVF:  off Proph:  SCDs  Code Status: partial Family Communication: patient alone Disposition Plan:  Admit to stepdown  Time spent: 60 min Alexismarie Flaim  [facility] Pager [phone]  If 7PM-7AM, please contact night-coverage [email or web address] Password [facility] 02/20/2015, 4:37 PM

## 2015-02-20 NOTE — ED Notes (Signed)
Patient returned from CT

## 2015-02-20 NOTE — ED Notes (Signed)
Patient transported to CT 

## 2015-02-20 NOTE — ED Notes (Signed)
Patient had cataract surgery on 02/21/15 and then followed up with her Doctor today.  Patient stated that her blood pressure is elevated and her heart rate is too slow.

## 2015-02-20 NOTE — ED Provider Notes (Signed)
CSN: [id number]     Arrival date & time 02/20/15  1212 History   First MD Initiated Contact with Patient 02/20/15 1234     Chief Complaint  Patient presents with  . Hypertension  . Bradycardia     (Consider location/radiation/quality/duration/timing/severity/associated sxs/prior Treatment) HPI Krista Rosales is a 81 year old female who presents the ER for evaluation of hypertension. Patient states she had cataract surgery yesterday on her right eye. She states she went to her PCP for follow-up today, who noted her to be hypertensive. Patient states she has been consistent hypertensive in the past 4 days with her systolic measured at home in the 220 range. Patient's PCP noted patient to be bradycardic in the mid 40s with blood pressure noted to be 200/70. PCP sent patient to the ER for further evaluation. Patient also complaint to me as a mild headache in the posterior parietal left aspect of her head which she states has been constant since 10:00 AM. Patient denies chest pain, shortness of breath, blurred vision, weakness, nausea, vomiting, dysuria.  Past Medical History  Diagnosis Date  . Hypertension   . Glaucoma   . ARTHRITIS 05/30/2008    Qualifier: Diagnosis of  By: Marland Mcalpine    . CORONARY ARTERY DISEASE 07/16/2007    Qualifier: Diagnosis of  By: Marca Ancona RMA, Lucy    . DIABETES MELLITUS, TYPE II 07/16/2007    Qualifier: Diagnosis of  By: Marca Ancona RMA, Lucy    . DIVERTICULOSIS, COLON 05/30/2008    Qualifier: Diagnosis of  By: Marland Mcalpine     History reviewed. No pertinent past surgical history. History reviewed. No pertinent family history. History  Substance Use Topics  . Smoking status: Former Research scientist (life sciences)  . Smokeless tobacco: Not on file  . Alcohol Use: No   OB History    No data available     Review of Systems  Constitutional: Negative for fever.  HENT: Negative for trouble swallowing.   Eyes: Negative for visual disturbance.  Respiratory: Negative for shortness of  breath.   Cardiovascular: Negative for chest pain.  Gastrointestinal: Negative for nausea, vomiting and abdominal pain.  Genitourinary: Negative for dysuria.  Musculoskeletal: Negative for neck pain.  Skin: Negative for rash.  Neurological: Positive for headaches. Negative for dizziness, weakness and numbness.  Psychiatric/Behavioral: Negative.       Allergies  Codeine; Penicillins; and Wellbutrin  Home Medications   Prior to Admission medications   Medication Sig Start Date End Date Taking? Authorizing Provider  acetaminophen (TYLENOL) 500 MG tablet Take 500 mg by mouth every 6 (six) hours as needed.   Yes Historical Provider, MD  amLODipine (NORVASC) 10 MG tablet Take 10 mg by mouth daily.   Yes Historical Provider, MD  aspirin 81 MG tablet Take 81 mg by mouth daily.   Yes Historical Provider, MD  Besifloxacin HCl 0.6 % SUSP Apply 1 drop to eye 3 (three) times daily. For 2 weeks   Yes Historical Provider, MD  cholecalciferol (VITAMIN D) 1000 UNITS tablet Take 1,000 Units by mouth 2 (two) times daily.   Yes Historical Provider, MD  Difluprednate 0.05 % EMUL Apply 1 drop to eye See admin instructions. 1 drop 3 times a day for 1 week, then 2 times a day for 1 week, and then 1 time a day for 1 week, then STOP   Yes Historical Provider, MD  glimepiride (AMARYL) 2 MG tablet Take 2 mg by mouth daily with breakfast.   Yes Historical Provider, MD  lisinopril-hydrochlorothiazide (  PRINZIDE,ZESTORETIC) 20-12.5 MG per tablet Take 1 tablet by mouth daily.   Yes Historical Provider, MD  loratadine (CLARITIN) 10 MG tablet Take 10 mg by mouth daily.   Yes Historical Provider, MD  metFORMIN (GLUCOPHAGE) 1000 MG tablet Take 1,000 mg by mouth 2 (two) times daily with a meal.   Yes Historical Provider, MD  mometasone-formoterol (DULERA) 100-5 MCG/ACT AERO Inhale 2 puffs into the lungs 2 (two) times daily as needed for wheezing.   Yes Historical Provider, MD  nebivolol (BYSTOLIC) 10 MG tablet Take 10 mg by  mouth daily.   Yes Historical Provider, MD  Nepafenac 0.3 % SUSP Apply 1 drop to eye daily. For 4 weeks   Yes Historical Provider, MD  pantoprazole (PROTONIX) 40 MG tablet Take 40 mg by mouth 3 (three) times daily.   Yes Historical Provider, MD  vitamin B-12 (CYANOCOBALAMIN) 500 MCG tablet Take 500 mcg by mouth daily.   Yes Historical Provider, MD   BP 180/160 mmHg  Pulse 73  Temp(Src) 98.3 F (36.8 C) (Oral)  Resp 20  Ht 5\' 7"  (1.702 m)  Wt 178 lb (80.74 kg)  BMI 27.87 kg/m2  SpO2 98% Physical Exam  Constitutional: She is oriented to person, place, and time. She appears well-developed and well-nourished. No distress.  HENT:  Head: Normocephalic and atraumatic.  Mouth/Throat: Oropharynx is clear and moist. No oropharyngeal exudate.  Eyes: Right eye exhibits no discharge. Left eye exhibits no discharge. No scleral icterus.  Neck: Normal range of motion.  Cardiovascular: Normal rate, regular rhythm and normal heart sounds.   No murmur heard. Pulmonary/Chest: Effort normal and breath sounds normal. No respiratory distress.  Abdominal: Soft. There is no tenderness.  Musculoskeletal: Normal range of motion. She exhibits no edema or tenderness.  Neurological: She is alert and oriented to person, place, and time. She has normal strength. No cranial nerve deficit or sensory deficit. Coordination normal. GCS eye subscore is 4. GCS verbal subscore is 5. GCS motor subscore is 6.  Patient fully alert, answering questions appropriately in full, clear sentences. Cranial nerves II through XII grossly intact. Motor strength 5 out of 5 in all major muscle groups of upper and lower extremity. Distal sensation intact.  Skin: Skin is warm and dry. No rash noted. She is not diaphoretic.  Psychiatric: She has a normal mood and affect.  Nursing note and vitals reviewed.   ED Course  Procedures (including critical care time) Labs Review Labs Reviewed  COMPREHENSIVE METABOLIC PANEL - Abnormal; Notable  for the following:    Glucose, Bld 141 (*)    Total Protein 8.4 (*)    Alkaline Phosphatase 32 (*)    GFR calc non Af Amer 48 (*)    GFR calc Af Amer 55 (*)    All other components within normal limits  CBC WITH DIFFERENTIAL/PLATELET  I-STAT TROPOININ, ED    Imaging Review Ct Head Wo Contrast  02/20/2015   CLINICAL DATA:  Headache and hypertension. One day post cataract surgery  EXAM: CT HEAD WITHOUT CONTRAST  TECHNIQUE: Contiguous axial images were obtained from the base of the skull through the vertex without intravenous contrast.  COMPARISON:  None.  FINDINGS: There is mild diffuse atrophy. There is no intracranial mass, hemorrhage, extra-axial fluid collection, or midline shift. There is mild small vessel disease in the centra semiovale bilaterally. Elsewhere gray-white compartments appear normal. No acute infarct is apparent. The bony calvarium appears intact. The mastoid air cells are clear. There has been removal of the  lens from the right eye region.  IMPRESSION: Mild atrophy with patchy periventricular small vessel disease. There is no intracranial mass, hemorrhage, or acute appearing infarct.   Electronically Signed   By: Lowella Grip III M.D.   On: 02/20/2015 14:18     EKG Interpretation   Date/Time:  Tuesday February 20 2015 12:23:56 EST Ventricular Rate:  48 PR Interval:  200 QRS Duration: 90 QT Interval:  472 QTC Calculation: 421 R Axis:   -46 Text Interpretation:  Sinus bradycardia Left anterior fascicular block  Nonspecific ST abnormality Abnormal ECG Bradycardia new Confirmed by  Mingo Amber  MD, Arabi ([id number]) on 02/20/2015 12:33:31 PM      MDM   Final diagnoses:  Headache  Hypertensive urgency    Patient here with hypertension after being seen PCP despite compliance with outpatient therapy. Patient describing mild headache. With patient being over 50 with new headache, we'll follow-up with CT head without contrast. Patient bradycardic with her hypertension, cannot  use beta blockers at this point, we will place patient on nitro drip to improve the pressure and titrate to systolic BP greater than [id number].  CT head without contrast with impression: Mild atrophy with patchy periventricular small vessel disease. There is no intracranial mass, hemorrhage, or acute appearing infarct.  Patient's lab work otherwise reassuring. Renal function intact. Troponin negative. EKG shows sinus bradycardia without acute injury or ectopy. Systolic blood pressure improved to 195 with nitroglycerin drip. Will admit to medicine for hypertensive urgency.  BP 180/160 mmHg  Pulse 73  Temp(Src) 98.3 F (36.8 C) (Oral)  Resp 20  Ht 5\' 7"  (1.702 m)  Wt 178 lb (80.74 kg)  BMI 27.87 kg/m2  SpO2 98%  Signed,  Dahlia Bailiff, PA-C 3:21 PM  Patient seen and discussed with Dr. Evelina Bucy, MD    Carrie Mew, PA-C 02/20/15 [place], MD 02/21/15 [phone]

## 2015-02-21 LAB — BASIC METABOLIC PANEL
Anion gap: 10 (ref 5–15)
BUN: 16 mg/dL (ref 6–23)
CO2: 27 mmol/L (ref 19–32)
Calcium: 8.2 mg/dL — ABNORMAL LOW (ref 8.4–10.5)
Chloride: 101 mmol/L (ref 96–112)
Creatinine, Ser: 1.02 mg/dL (ref 0.50–1.10)
GFR calc Af Amer: 59 mL/min — ABNORMAL LOW (ref >90)
GFR calc non Af Amer: 51 mL/min — ABNORMAL LOW (ref >90)
Glucose, Bld: 102 mg/dL — ABNORMAL HIGH (ref 70–99)
Potassium: 3 mmol/L — ABNORMAL LOW (ref 3.5–5.1)
Sodium: 138 mmol/L (ref 135–145)

## 2015-02-21 LAB — CBC
HCT: 28.4 % — ABNORMAL LOW (ref 36.0–46.0)
Hemoglobin: 9.3 g/dL — ABNORMAL LOW (ref 12.0–15.0)
MCH: 27.3 pg (ref 26.0–34.0)
MCHC: 32.7 g/dL (ref 30.0–36.0)
MCV: 83.3 fL (ref 78.0–100.0)
Platelets: 285 10*3/uL (ref 150–400)
RBC: 3.41 MIL/uL — ABNORMAL LOW (ref 3.87–5.11)
RDW: 14.7 % (ref 11.5–15.5)
WBC: 6.4 10*3/uL (ref 4.0–10.5)

## 2015-02-21 LAB — GLUCOSE, CAPILLARY
Glucose-Capillary: 223 mg/dL — ABNORMAL HIGH (ref 70–99)
Glucose-Capillary: 180 mg/dL — ABNORMAL HIGH (ref 70–99)
Glucose-Capillary: 160 mg/dL — ABNORMAL HIGH (ref 70–99)
Glucose-Capillary: 250 mg/dL — ABNORMAL HIGH (ref 70–99)

## 2015-02-21 LAB — MAGNESIUM: Magnesium: 1.3 mg/dL — ABNORMAL LOW (ref 1.5–2.5)

## 2015-02-21 LAB — TROPONIN I: Troponin I: 0.03 ng/mL (ref <0.031)

## 2015-02-21 MED ORDER — NEBIVOLOL HCL 5 MG PO TABS
5.0000 mg | ORAL_TABLET | Freq: Every day | ORAL | Status: DC
  Administered 2015-02-22 – 2015-02-23 (×2): 5 mg via ORAL
  Filled 2015-02-21 (×2): qty 1

## 2015-02-21 MED ORDER — INSULIN ASPART 100 UNIT/ML ~~LOC~~ SOLN
0.0000 [IU] | Freq: Every day | SUBCUTANEOUS | Status: DC
  Administered 2015-02-21: 2 [IU] via SUBCUTANEOUS

## 2015-02-21 MED ORDER — MAGNESIUM SULFATE 2 GM/50ML IV SOLN
2.0000 g | Freq: Once | INTRAVENOUS | Status: AC
  Administered 2015-02-21: 2 g via INTRAVENOUS
  Filled 2015-02-21: qty 50

## 2015-02-21 MED ORDER — POTASSIUM CHLORIDE CRYS ER 20 MEQ PO TBCR
40.0000 meq | EXTENDED_RELEASE_TABLET | Freq: Once | ORAL | Status: AC
  Administered 2015-02-21: 40 meq via ORAL
  Filled 2015-02-21: qty 2

## 2015-02-21 MED ORDER — INSULIN ASPART 100 UNIT/ML ~~LOC~~ SOLN
0.0000 [IU] | Freq: Three times a day (TID) | SUBCUTANEOUS | Status: DC
  Administered 2015-02-21 – 2015-02-22 (×2): 3 [IU] via SUBCUTANEOUS
  Administered 2015-02-22: 2 [IU] via SUBCUTANEOUS
  Administered 2015-02-22: 5 [IU] via SUBCUTANEOUS
  Administered 2015-02-23: 2 [IU] via SUBCUTANEOUS
  Administered 2015-02-23: 3 [IU] via SUBCUTANEOUS

## 2015-02-21 NOTE — Progress Notes (Signed)
UR COMPLETED  

## 2015-02-21 NOTE — Progress Notes (Signed)
[place] TEAM 1 - Stepdown/ICU TEAM Progress Note  Krista Rosales [id number] DOB: Sep 21, 1934 DOA: 02/20/2015 PCP: Renato Shin, MD  Admit HPI / Brief Narrative: 81 year old F who lives at home with her brother, w/ PMH of CAD s/p cardiac cath, hypertension, CHF, COPD, type 2 diabetes mellitus, iron deficiency anemia, osteoarthritis, osteoporosis, and glaucoma who presented to the ED with headache and hypertension.  Patient was sent to the ED from her eye doctor after she was found to have elevated blood pressure 220/70.  She underwent right eye cataract surgery on 02/19/2015.  In ED, BP 245/85, which trended down with Nitro gtt. CT head with no acute findings, EKG NSR, Troponin negative.   HPI/Subjective: A and O x4 - no complaints - denies headache, sob, or cp  Assessment/Plan:  Hypertensive urgency with headache -HA has now resolved  -CT head - no acute abnormalities -Continue nitro drip, titrate as appropriate -Continue home nebivolol, norvasc, HCTZ, lisinopril, hydralazine, Imdur, spironolactone -Renal US pending  Hypokalemia -K-Dur x1  -repeat bumetanide in a.m.  Bradycardia -Heart rate in 50s -dec nabivelol to 5mg  from 10mg   Diabetes mellitus type 2 -CBGs elevated -Hemoglobin A1c pending -Changed sensitive SSI to mod SSI  Normocytic Anemia  -Hgb 9.3- denies any hx/signs of bleed- likely due to chronic disease -repeat CBC in am  Chronic compensated diastolic CHF -appears euvolemic -2-D echo pending  Coronary artery disease -Denies any chest pain - continue ASA, BP, ACEI -Started on atorvastatin 20 mg  COPD -Stable  Hx of Cataract surgery   Glaucoma -cont eye drops  Code Status: intubate prn, but NO CPR or CODE BLUE  Family Communication: no family present at time of exam Disposition Plan: SDU for nitro gtt  Consultants: None  Procedure/Significant Events: CT head- no acute abnormalities Renal US-  pending  Culture None  Antibiotics: None  DVT prophylaxis: SCDs  Continuous Infusions: . nitroGLYCERIN 10 mcg/min (02/21/15 1500)   Objective: Blood pressure 156/45, pulse 55, temperature 98.2 F (36.8 C), temperature source Oral, resp. rate 22, height 5\' 7"  (1.702 m), weight 81.2 kg (179 lb 0.2 oz), SpO2 97 %.  Intake/Output Summary (Last 24 hours) at 02/21/15 1541 Last data filed at 02/21/15 1500  Gross per 24 hour  Intake 1678.3 ml  Output   1550 ml  Net  128.3 ml   Exam: General: sitting comfortably in bed, eating breakfast. Lungs: Clear to auscultation bilaterally without wheezes or crackles Cardiovascular: Regular rate and rhythm without murmur gallop or rub normal S1 and S2 Abdomen: Nontender, nondistended, soft, bowel sounds positive, no rebound, no ascites, no appreciable mass Extremities: No significant cyanosis, clubbing, or edema bilateral lower extremities  Data Reviewed: Basic Metabolic Panel:  Recent Labs Lab 02/20/15 1256 02/21/15 0415  NA 141 138  K 3.5 3.0*  CL 103 101  CO2 31 27  GLUCOSE 141* 102*  BUN 15 16  CREATININE 1.08 1.02  CALCIUM 8.8 8.2*  MG  --  1.3*   Liver Function Tests:  Recent Labs Lab 02/20/15 1256  AST 23  ALT 12  ALKPHOS 32*  BILITOT 0.4  PROT 8.4*  ALBUMIN 4.0   CBC:  Recent Labs Lab 02/20/15 1256 02/21/15 0415  WBC 6.6 6.4  NEUTROABS 4.3  --   HGB 12.0 9.3*  HCT 36.5 28.4*  MCV 83.3 83.3  PLT 346 285   Cardiac Enzymes:  Recent Labs Lab 02/20/15 1850 02/20/15 2222 02/21/15 0415  TROPONINI <0.03 0.03 0.03   CBG:  Recent Labs Lab  02/20/15 1702 02/20/15 2130 02/21/15 1134  GLUCAP 195* 153* 180*    Recent Results (from the past 240 hour(s))  MRSA PCR Screening     Status: None   Collection Time: 02/20/15  4:15 PM  Result Value Ref Range Status   MRSA by PCR NEGATIVE NEGATIVE Final    Comment:        The GeneXpert MRSA Assay (FDA approved for NASAL specimens only), is one component  of a comprehensive MRSA colonization surveillance program. It is not intended to diagnose MRSA infection nor to guide or monitor treatment for MRSA infections.      Studies:  Recent x-ray studies have been reviewed in detail by the Attending Physician  Scheduled Meds:  Scheduled Meds: . amLODipine  10 mg Oral Daily  . aspirin EC  81 mg Oral Daily  . atorvastatin  20 mg Oral q1800  . Besifloxacin HCl  1 drop Ophthalmic TID  . Difluprednate  1 drop Ophthalmic TID  . docusate sodium  100 mg Oral BID  . hydrALAZINE  100 mg Oral 4 times per day  . hydrochlorothiazide  25 mg Oral Once  . lisinopril  40 mg Oral Daily   And  . hydrochlorothiazide  25 mg Oral Daily  . insulin aspart  0-5 Units Subcutaneous QHS  . insulin aspart  0-9 Units Subcutaneous TID WC  . isosorbide mononitrate  60 mg Oral Daily  . lisinopril  20 mg Oral Once  . nebivolol  10 mg Oral Daily  . Nepafenac  1 drop Right Eye Daily  . pantoprazole  40 mg Oral BID AC  . senna  1 tablet Oral BID  . sodium chloride  3 mL Intravenous Q12H  . spironolactone  25 mg Oral Daily    Time spent on care of this patient: 60mins  [place], [place] , [facility]  [facility] Office  [phone] Pager - [phone]  On-Call/Text Page:      [email or web address]      password [email or web address]  If 7PM-7AM, please contact night-coverage [email or web address] Password [email or web address] 02/21/2015, 3:41 PM   LOS: 1 day   I have personally examined this patient and reviewed the entire database. I have reviewed the above note, made any necessary editorial changes, and agree with its content.  Cherene Altes, MD [facility]

## 2015-02-22 DIAGNOSIS — I160 Hypertensive urgency: Secondary | ICD-10-CM | POA: Diagnosis present

## 2015-02-22 DIAGNOSIS — I5032 Chronic diastolic (congestive) heart failure: Secondary | ICD-10-CM | POA: Diagnosis present

## 2015-02-22 DIAGNOSIS — R519 Headache, unspecified: Secondary | ICD-10-CM | POA: Diagnosis present

## 2015-02-22 DIAGNOSIS — J438 Other emphysema: Secondary | ICD-10-CM

## 2015-02-22 DIAGNOSIS — E876 Hypokalemia: Secondary | ICD-10-CM

## 2015-02-22 DIAGNOSIS — H409 Unspecified glaucoma: Secondary | ICD-10-CM

## 2015-02-22 DIAGNOSIS — R001 Bradycardia, unspecified: Secondary | ICD-10-CM

## 2015-02-22 DIAGNOSIS — IMO0002 Reserved for concepts with insufficient information to code with codable children: Secondary | ICD-10-CM | POA: Diagnosis present

## 2015-02-22 DIAGNOSIS — D649 Anemia, unspecified: Secondary | ICD-10-CM

## 2015-02-22 DIAGNOSIS — R51 Headache: Secondary | ICD-10-CM

## 2015-02-22 DIAGNOSIS — E1165 Type 2 diabetes mellitus with hyperglycemia: Secondary | ICD-10-CM | POA: Diagnosis present

## 2015-02-22 DIAGNOSIS — I509 Heart failure, unspecified: Secondary | ICD-10-CM

## 2015-02-22 LAB — BASIC METABOLIC PANEL
Anion gap: 8 (ref 5–15)
BUN: 15 mg/dL (ref 6–23)
CO2: 28 mmol/L (ref 19–32)
Calcium: 8.2 mg/dL — ABNORMAL LOW (ref 8.4–10.5)
Chloride: 103 mmol/L (ref 96–112)
Creatinine, Ser: 1.1 mg/dL (ref 0.50–1.10)
GFR calc Af Amer: 54 mL/min — ABNORMAL LOW (ref >90)
GFR calc non Af Amer: 47 mL/min — ABNORMAL LOW (ref >90)
Glucose, Bld: 137 mg/dL — ABNORMAL HIGH (ref 70–99)
Potassium: 3.3 mmol/L — ABNORMAL LOW (ref 3.5–5.1)
Sodium: 139 mmol/L (ref 135–145)

## 2015-02-22 LAB — CBC
HCT: 27.9 % — ABNORMAL LOW (ref 36.0–46.0)
Hemoglobin: 9 g/dL — ABNORMAL LOW (ref 12.0–15.0)
MCH: 26.5 pg (ref 26.0–34.0)
MCHC: 32.3 g/dL (ref 30.0–36.0)
MCV: 82.3 fL (ref 78.0–100.0)
Platelets: 302 10*3/uL (ref 150–400)
RBC: 3.39 MIL/uL — ABNORMAL LOW (ref 3.87–5.11)
RDW: 14.8 % (ref 11.5–15.5)
WBC: 6 10*3/uL (ref 4.0–10.5)

## 2015-02-22 LAB — RETICULOCYTES
RBC.: 3.58 MIL/uL — ABNORMAL LOW (ref 3.87–5.11)
Retic Count, Absolute: 46.5 10*3/uL (ref 19.0–186.0)
Retic Ct Pct: 1.3 % (ref 0.4–3.1)

## 2015-02-22 LAB — GLUCOSE, CAPILLARY
Glucose-Capillary: 148 mg/dL — ABNORMAL HIGH (ref 70–99)
Glucose-Capillary: 206 mg/dL — ABNORMAL HIGH (ref 70–99)
Glucose-Capillary: 167 mg/dL — ABNORMAL HIGH (ref 70–99)
Glucose-Capillary: 168 mg/dL — ABNORMAL HIGH (ref 70–99)

## 2015-02-22 LAB — HEMOGLOBIN A1C
Hgb A1c MFr Bld: 7.7 % — ABNORMAL HIGH (ref 4.8–5.6)
Mean Plasma Glucose: 174 mg/dL

## 2015-02-22 LAB — LIPID PANEL
Cholesterol: 146 mg/dL (ref 0–200)
HDL: 30 mg/dL — ABNORMAL LOW (ref >39)
LDL Cholesterol: 96 mg/dL (ref 0–99)
Total CHOL/HDL Ratio: 4.9 RATIO
Triglycerides: 98 mg/dL (ref <150)
VLDL: 20 mg/dL (ref 0–40)

## 2015-02-22 LAB — MAGNESIUM: Magnesium: 2.1 mg/dL (ref 1.5–2.5)

## 2015-02-22 LAB — LACTATE DEHYDROGENASE: LDH: 92 U/L — ABNORMAL LOW (ref 94–250)

## 2015-02-22 MED ORDER — INSULIN ASPART 100 UNIT/ML ~~LOC~~ SOLN
3.0000 [IU] | Freq: Three times a day (TID) | SUBCUTANEOUS | Status: DC
  Administered 2015-02-22 – 2015-02-23 (×3): 3 [IU] via SUBCUTANEOUS

## 2015-02-22 MED ORDER — POTASSIUM CHLORIDE CRYS ER 20 MEQ PO TBCR
40.0000 meq | EXTENDED_RELEASE_TABLET | Freq: Once | ORAL | Status: AC
  Administered 2015-02-22: 40 meq via ORAL
  Filled 2015-02-22: qty 2

## 2015-02-22 NOTE — Progress Notes (Signed)
  Echocardiogram 2D Echocardiogram has been performed.  Krista Rosales 02/22/2015, 2:58 PM

## 2015-02-22 NOTE — Progress Notes (Signed)
Report called to RN, [place]. All questions answered.

## 2015-02-22 NOTE — Progress Notes (Signed)
Inpatient Diabetes Program Recommendations  AACE/ADA: New Consensus Statement on Inpatient Glycemic Control (2013)  Target Ranges:  Prepandial:   less than 140 mg/dL      Peak postprandial:   less than 180 mg/dL (1-2 hours)      Critically ill patients:  140 - 180 mg/dL   Results for MILDRETH, SCHENCK (MRN [id number]) as of 02/22/2015 11:07  Ref. Range 02/21/2015 08:18 02/21/2015 11:34 02/21/2015 16:56 02/21/2015 21:13 02/22/2015 07:41  Glucose-Capillary Latest Range: 70-99 mg/dL 223 (H) 180 (H) 160 (H) 250 (H) 148 (H)   Diabetes history: DM2 Outpatient Diabetes medications: Amaryl 2 mg QAM, Metformin 1000 mg BID Current orders for Inpatient glycemic control: Novolog 0-15 units TID with meals, Novolog 0-5 units HS  Inpatient Diabetes Program Recommendations Insulin - Meal Coverage: Please consider ordering Novolog 3 units TID with meals for meal coverage if patient is eating at least 50% of meals (in addition to Novolog correction scale).  Thanks, Barnie Alderman, RN, MSN, CCRN, CDE Diabetes Coordinator Inpatient Diabetes Program [phone] (Team Pager) [phone] (AP office) [phone] [facility] office)

## 2015-02-22 NOTE — Progress Notes (Addendum)
Pt arrived to floor, A/Ox4, steady on feet. Pt c/o headache. Pt given tylenol PRN and BP checked 196/70. PRN hydralazine given. Will continue to monitor. Ronnette Hila, RN

## 2015-02-22 NOTE — Progress Notes (Addendum)
[place] TEAM 1 - Stepdown/ICU TEAM Progress Note  Krista Rosales [id number] DOB: [birth year] DOA: 02/20/2015 PCP: No primary care provider on file.  Admit HPI / Brief Narrative: 81 year old BF who lives at home with her brother, w/ PMHx of CAD s/p cardiac cath, hypertension, CHF, COPD, type 2 diabetes mellitus, iron deficiency anemia, osteoarthritis, osteoporosis, and glaucoma who presented to the ED with headache and hypertension.  Patient was sent to the ED from her eye doctor after she was found to have elevated blood pressure 220/70.  She underwent right eye cataract surgery on 02/19/2015.  In ED, BP 245/85, which trended down with Nitro gtt. CT head with no acute findings, EKG NSR, Troponin negative.   HPI/Subjective: A and O x4 - no complaints - denies headache, sob, or cp. States would like to go home  Assessment/Plan:  Hypertensive urgency with headache -HA has now resolved  -CT head - no acute abnormalities -is off Nitro drip, BP is mildly elevated -Continue home nebivolol, norvasc, HCTZ, lisinopril. Cont newly added  hydralazine, Imdur, spironolactone -Following echocardiogram complete adjustment of BP medication  -Renal US pending  Hypokalemia -mag normal -K-Dur x1  -repeat BMET in a.m.  Bradycardia -Heart rate in 50s -cont lower dose nabivolol 5mg   Diabetes mellitus type 2 uncontrolled -CBGs elevated -Hemoglobin A1c 7.7 -placed on Novolog 3U TID, mod SSI  Normocytic Anemia  -Hgb 9.0- denies any hx/signs of bleed- likely due to chronic disease -repeat CBC in am -Anemia workup panel pending  Chronic compensated diastolic CHF -appears euvolemic -2-D echo pending  Coronary artery disease -Denies any chest pain - continue ASA, BP, ACEI -Started on atorvastatin 20 mg -Lipid panel pending  COPD -Stable  Hx of Cataract surgery   Glaucoma -cont eye drops  Code Status: intubate prn, but NO CPR or CODE BLUE  Family Communication: no family present at  time of exam Disposition Plan: Next 24-48 hours  Consultants: None  Procedure/Significant Events: CT head- no acute abnormalities Renal US- pending   Culture none  Antibiotics: none  DVT prophylaxis: SCDs  Devices None  LINES / TUBES:  None   Continuous Infusions: . nitroGLYCERIN Stopped (02/22/15 0400)   Objective: Blood pressure 170/40, pulse 49, temperature 98.1 F (36.7 C), temperature source Oral, resp. rate 17, height 5\' 7"  (1.702 m), weight 81.647 kg (180 lb), SpO2 99 %.  Intake/Output Summary (Last 24 hours) at 02/22/15 1427 Last data filed at 02/21/15 2008  Gross per 24 hour  Intake    243 ml  Output    251 ml  Net     -8 ml   Exam: General: A/O 4, sitting comfortably in bed, eating breakfast. Lungs: Clear to auscultation bilaterally without wheezes or crackles Cardiovascular: Regular rate and rhythm, positive 2/6 systolic murmur without gallop or rub normal S1 and S2 Abdomen: Nontender, nondistended, soft, bowel sounds positive, no rebound, no ascites, no appreciable mass Extremities: No significant cyanosis, clubbing, or edema bilateral lower extremities  Data Reviewed: Basic Metabolic Panel:  Recent Labs Lab 02/20/15 1256 02/21/15 0415 02/22/15 0242  NA 141 138 139  K 3.5 3.0* 3.3*  CL 103 101 103  CO2 31 27 28   GLUCOSE 141* 102* 137*  BUN 15 16 15   CREATININE 1.08 1.02 1.10  CALCIUM 8.8 8.2* 8.2*  MG  --  1.3* 2.1   Liver Function Tests:  Recent Labs Lab 02/20/15 1256  AST 23  ALT 12  ALKPHOS 32*  BILITOT 0.4  PROT 8.4*  ALBUMIN 4.0  CBC:  Recent Labs Lab 02/20/15 1256 02/21/15 0415 02/22/15 0242  WBC 6.6 6.4 6.0  NEUTROABS 4.3  --   --   HGB 12.0 9.3* 9.0*  HCT 36.5 28.4* 27.9*  MCV 83.3 83.3 82.3  PLT 346 285 302   Cardiac Enzymes:  Recent Labs Lab 02/20/15 1850 02/20/15 2222 02/21/15 0415  TROPONINI <0.03 0.03 0.03   CBG:  Recent Labs Lab 02/21/15 1134 02/21/15 1656 02/21/15 2113  02/22/15 0741 02/22/15 1213  GLUCAP 180* 160* 250* 148* 206*    Recent Results (from the past 240 hour(s))  MRSA PCR Screening     Status: None   Collection Time: 02/20/15  4:15 PM  Result Value Ref Range Status   MRSA by PCR NEGATIVE NEGATIVE Final    Comment:        The GeneXpert MRSA Assay (FDA approved for NASAL specimens only), is one component of a comprehensive MRSA colonization surveillance program. It is not intended to diagnose MRSA infection nor to guide or monitor treatment for MRSA infections.      Studies:  Recent x-ray studies have been reviewed in detail by the Attending Physician  Scheduled Meds:  Scheduled Meds: . amLODipine  10 mg Oral Daily  . aspirin EC  81 mg Oral Daily  . atorvastatin  20 mg Oral q1800  . Besifloxacin HCl  1 drop Ophthalmic TID  . Difluprednate  1 drop Ophthalmic TID  . docusate sodium  100 mg Oral BID  . hydrALAZINE  100 mg Oral 4 times per day  . lisinopril  40 mg Oral Daily   And  . hydrochlorothiazide  25 mg Oral Daily  . insulin aspart  0-15 Units Subcutaneous TID WC  . insulin aspart  0-5 Units Subcutaneous QHS  . insulin aspart  3 Units Subcutaneous TID WC  . isosorbide mononitrate  60 mg Oral Daily  . nebivolol  5 mg Oral Daily  . Nepafenac  1 drop Right Eye Daily  . pantoprazole  40 mg Oral BID AC  . senna  1 tablet Oral BID  . sodium chloride  3 mL Intravenous Q12H  . spironolactone  25 mg Oral Daily    Time spent on care of this patient: 64mins  [place], [place] , [facility]  [facility] Office  [phone] Pager - [phone]  On-Call/Text Page:      [email or web address]      password [email or web address]  If 7PM-7AM, please contact night-coverage [email or web address] Password [email or web address] 02/22/2015, 2:27 PM   LOS: 2 days   Examined Patient and discussed A&P with PA Sahar and agree with above plan. I have reviewed the entire database. I have made any necessary editorial changes, and agree with its content. I have reviewed this  patient's available data, including medical history, events of note, physical examination, radiology studies and test results as part of my evaluation Pt with Multiple Complex medical problems> 35 min spent in direct Pt care       Dia Crawford, MD   [facility]   [phone] pager

## 2015-02-23 LAB — CBC WITH DIFFERENTIAL/PLATELET
Basophils Absolute: 0 10*3/uL (ref 0.0–0.1)
Basophils Relative: 1 % (ref 0–1)
Eosinophils Absolute: 0.1 10*3/uL (ref 0.0–0.7)
Eosinophils Relative: 2 % (ref 0–5)
HCT: 29.2 % — ABNORMAL LOW (ref 36.0–46.0)
Hemoglobin: 9.7 g/dL — ABNORMAL LOW (ref 12.0–15.0)
Lymphocytes Relative: 23 % (ref 12–46)
Lymphs Abs: 1.5 10*3/uL (ref 0.7–4.0)
MCH: 27.6 pg (ref 26.0–34.0)
MCHC: 33.2 g/dL (ref 30.0–36.0)
MCV: 83 fL (ref 78.0–100.0)
Monocytes Absolute: 0.7 10*3/uL (ref 0.1–1.0)
Monocytes Relative: 11 % (ref 3–12)
Neutro Abs: 4.1 10*3/uL (ref 1.7–7.7)
Neutrophils Relative %: 63 % (ref 43–77)
Platelets: 310 10*3/uL (ref 150–400)
RBC: 3.52 MIL/uL — ABNORMAL LOW (ref 3.87–5.11)
RDW: 15 % (ref 11.5–15.5)
WBC: 6.4 10*3/uL (ref 4.0–10.5)

## 2015-02-23 LAB — COMPREHENSIVE METABOLIC PANEL
ALT: 9 U/L (ref 0–35)
AST: 13 U/L (ref 0–37)
Albumin: 3.2 g/dL — ABNORMAL LOW (ref 3.5–5.2)
Alkaline Phosphatase: 25 U/L — ABNORMAL LOW (ref 39–117)
Anion gap: 10 (ref 5–15)
BUN: 15 mg/dL (ref 6–23)
CO2: 23 mmol/L (ref 19–32)
Calcium: 8.6 mg/dL (ref 8.4–10.5)
Chloride: 105 mmol/L (ref 96–112)
Creatinine, Ser: 1.2 mg/dL — ABNORMAL HIGH (ref 0.50–1.10)
GFR calc Af Amer: 49 mL/min — ABNORMAL LOW (ref >90)
GFR calc non Af Amer: 42 mL/min — ABNORMAL LOW (ref >90)
Glucose, Bld: 151 mg/dL — ABNORMAL HIGH (ref 70–99)
Potassium: 3.8 mmol/L (ref 3.5–5.1)
Sodium: 138 mmol/L (ref 135–145)
Total Bilirubin: 0.4 mg/dL (ref 0.3–1.2)
Total Protein: 6.2 g/dL (ref 6.0–8.3)

## 2015-02-23 LAB — MAGNESIUM: Magnesium: 1.9 mg/dL (ref 1.5–2.5)

## 2015-02-23 LAB — GLUCOSE, CAPILLARY
Glucose-Capillary: 141 mg/dL — ABNORMAL HIGH (ref 70–99)
Glucose-Capillary: 198 mg/dL — ABNORMAL HIGH (ref 70–99)

## 2015-02-23 LAB — IRON AND TIBC
Iron: 27 ug/dL — ABNORMAL LOW (ref 42–145)
Saturation Ratios: 11 % — ABNORMAL LOW (ref 20–55)
TIBC: 254 ug/dL (ref 250–470)
UIBC: 227 ug/dL (ref 125–400)

## 2015-02-23 LAB — FERRITIN: Ferritin: 50 ng/mL (ref 10–291)

## 2015-02-23 LAB — HAPTOGLOBIN: Haptoglobin: 160 mg/dL (ref 34–200)

## 2015-02-23 LAB — VITAMIN B12: Vitamin B-12: 1078 pg/mL — ABNORMAL HIGH (ref 211–911)

## 2015-02-23 LAB — FOLATE: Folate: 10.4 ng/mL

## 2015-02-23 MED ORDER — HYDRALAZINE HCL 100 MG PO TABS: 100.0000 mg | ORAL_TABLET | Freq: Four times a day (QID) | ORAL | Status: DC

## 2015-02-23 MED ORDER — ISOSORBIDE MONONITRATE ER 60 MG PO TB24: 60.0000 mg | ORAL_TABLET | Freq: Every day | ORAL | Status: DC

## 2015-02-23 MED ORDER — LISINOPRIL-HYDROCHLOROTHIAZIDE 20-12.5 MG PO TABS: 2.0000 | ORAL_TABLET | Freq: Every day | ORAL | Status: DC

## 2015-02-23 MED ORDER — NEBIVOLOL HCL 5 MG PO TABS: 5.0000 mg | ORAL_TABLET | Freq: Every day | ORAL | Status: DC

## 2015-02-23 NOTE — Progress Notes (Signed)
CARE MANAGEMENT NOTE 02/23/2015  Patient:  Krista Rosales, Krista Rosales   Account Number:  [id number]  Date Initiated:  02/23/2015  Documentation initiated by:  [facility]  Subjective/Objective Assessment:   HTN urgency     Action/Plan:   lives at home with brother   Anticipated DC Date:  02/23/2015   Anticipated DC Plan:  [place]  CM consult      [facility] Choice  HOME HEALTH   Choice offered to / List presented to:  C-1 Patient        [place] arranged  HH-1 RN      [place].   Status of service:  Completed, signed off Medicare Important Message given?  YES (If response is "NO", the following Medicare IM given date fields will be blank) Date Medicare IM given:  02/23/2015 Medicare IM given by:  [facility] Date Additional Medicare IM given:   Additional Medicare IM given by:    Discharge Disposition:  [place]  Per UR Regulation:  Reviewed for med. necessity/level of care/duration of stay  If discussed at [place] of Stay Meetings, dates discussed:    Comments:  02/23/2015 1115 NCM spoke to pt and offered choice for [facility]. Pt states husband had [facility] in the past. States she walks independently with no DME.

## 2015-02-23 NOTE — Progress Notes (Signed)
D/C'd IV; D/C'd tele; D/C'd pt.  Discharge instructions were explained to pt, and she had no further questions at this time.  Pt in no acute distress.

## 2015-02-23 NOTE — Evaluation (Signed)
Physical Therapy Evaluation Patient Details Name: Krista Rosales MRN: [id number] DOB: [birth year] Today's Date: 02/23/2015   History of Present Illness  Patient is a 81 yo female admitted 02/20/15 with hypertensive urgency with headache.  PMH:  CAD, HTN, CHF, COPD, DM, anemia, OA, glaucoma  Clinical Impression  Patient presents independent with all mobility and gait, with good balance.  No acute PT needs identified - PT will sign off.    Follow Up Recommendations No PT follow up;Supervision - Intermittent    Equipment Recommendations  None recommended by PT    Recommendations for Other Services       Precautions / Restrictions Precautions Precautions: None Restrictions Weight Bearing Restrictions: No      Mobility  Bed Mobility Overal bed mobility: Independent                Transfers Overall transfer level: Independent Equipment used: None                Ambulation/Gait Ambulation/Gait assistance: Independent Ambulation Distance (Feet): 110 Feet Assistive device: None Gait Pattern/deviations: WFL(Within Functional Limits)   Gait velocity interpretation: at or above normal speed for age/gender General Gait Details: Patient demonstrates good gait pattern, speed, and balance.  Stairs            Wheelchair Mobility    Modified Rankin (Stroke Patients Only)       Balance Overall balance assessment: No apparent balance deficits (not formally assessed)                                           Pertinent Vitals/Pain Pain Assessment: No/denies pain    Home Living Family/patient expects to be discharged to:: Private residence Living Arrangements: Other relatives (brother) Available Help at Discharge: Family;Available 24 hours/day Type of Home: House Home Access: Stairs to enter Entrance Stairs-Rails: None Entrance Stairs-Number of Steps: 2 Home Layout: One level Home Equipment: None      Prior Function Level of  Independence: Independent         Comments: Patient and her brother share housekeeping.     Hand Dominance        Extremity/Trunk Assessment   Upper Extremity Assessment: Overall WFL for tasks assessed           Lower Extremity Assessment: Overall WFL for tasks assessed      Cervical / Trunk Assessment: Normal  Communication   Communication: No difficulties  Cognition Arousal/Alertness: Awake/alert Behavior During Therapy: WFL for tasks assessed/performed Overall Cognitive Status: Within Functional Limits for tasks assessed                      General Comments      Exercises        Assessment/Plan    PT Assessment Patent does not need any further PT services  PT Diagnosis Abnormality of gait   PT Problem List    PT Treatment Interventions     PT Goals (Current goals can be found in the Care Plan section) Acute Rehab PT Goals PT Goal Formulation: All assessment and education complete, DC therapy    Frequency     Barriers to discharge        Co-evaluation               End of Session Equipment Utilized During Treatment: Gait belt Activity Tolerance: Patient tolerated treatment well Patient  left: in bed;with call bell/phone within reach (sitting EOB) Nurse Communication: Mobility status         Time: [id number] PT Time Calculation (min) (ACUTE ONLY): 10 min   Charges:   PT Evaluation $Initial PT Evaluation Tier I: 1 Procedure     PT G CodesDespina Pole Mar 13, 2015, 10:23 AM Carita Pian. Sanjuana Kava, [place] Pager [phone]

## 2015-02-23 NOTE — Progress Notes (Addendum)
Pt HR in 40s overnight, down to 39 non-sustained. Pt asymptomatic and sleeping. Per chart, pt HR has been in 40s this admission. Will continue to monitor. 00:41 Sheilah Mins A, RN   Pt HR down to 38 non-sustained, pt still asymptomatic and sleeping. Fredirick Maudlin notified. 3:13 AM

## 2015-02-23 NOTE — Discharge Summary (Signed)
Krista Rosales, 81 y.o., DOB 10/28/1934, MRN [id number]. Admission date: 02/20/2015 Discharge Date 02/23/2015 Primary MD No primary care provider on file. Admitting Physician Janece Canterbury, MD   PCP please follow on: - Recheck labs including BMP during next visit, as patient and type of medication were adjusted follow on potassium and creatinine level - Patient may need further adjustment of her antihypertensive medication, started on hydralazine 100 mg oral 4 times a day, Imdur 60 mg oral daily, and her lisinopril/hydrochlorothiazide dose was doubled. Patient will need further adjustment and monitoring of her hypertension, may need to gradual increase of her medication or adding new medicine. - Patient will need a renal artery ultrasound to rule out stenosis as an outpatient.  Admission Diagnosis  Hypertensive urgency [I10] Headache [R51]  Discharge Diagnosis   Principal Problem:   Malignant hypertension Active Problems:   Diabetes mellitus type 2 with complications   Coronary atherosclerosis   Hypertension   Diabetes type 2, uncontrolled   Headache   Hypertensive urgency   Hypokalemia   Bradycardia   Normocytic anemia   Chronic diastolic CHF (congestive heart failure)   Other emphysema   Glaucoma   Past Medical History  Diagnosis Date  . Hypertension   . Glaucoma   . ARTHRITIS 05/30/2008    Qualifier: Diagnosis of  By: Marland Mcalpine    . CORONARY ARTERY DISEASE 07/16/2007    Qualifier: Diagnosis of  By: Marca Ancona RMA, Lucy    . DIABETES MELLITUS, TYPE II 07/16/2007    Qualifier: Diagnosis of  By: Marca Ancona RMA, Lucy    . DIVERTICULOSIS, COLON 05/30/2008    Qualifier: Diagnosis of  By: Marland Mcalpine    . HYPERLIPIDEMIA 07/16/2007    Qualifier: Diagnosis of  By: Reatha Armour, Lucy    . ANEMIA, IRON DEFICIENCY 05/07/2009    Qualifier: Diagnosis of  By: Loanne Drilling MD, Hilliard Clark A     History reviewed. No pertinent past surgical history.   Hospital Course See H&P, Labs, Consult and  Test reports for all details in brief, patient was admitted for **  Principal Problem:   Malignant hypertension Active Problems:   Diabetes mellitus type 2 with complications   Coronary atherosclerosis   Hypertension   Diabetes type 2, uncontrolled   Headache   Hypertensive urgency   Hypokalemia   Bradycardia   Normocytic anemia   Chronic diastolic CHF (congestive heart failure)   Other emphysema   Glaucoma  Admit HPI / Brief Narrative: 81 year old BF who lives at home with her brother, w/ PMHx of CAD s/p cardiac cath, hypertension, CHF, COPD, type 2 diabetes mellitus, iron deficiency anemia, osteoarthritis, osteoporosis, and glaucoma who presented to the ED with headache and hypertension. Patient was sent to the ED from her eye doctor after she was found to have elevated blood pressure 220/70. She underwent right eye cataract surgery on 02/19/2015. In ED, BP 245/85, which trended down with Nitro gtt. CT head with no acute findings, EKG NSR, Troponin negative.  Patient was transitioned to oral antihypertensive medication, blood pressure generally acceptable on the neurologic regimen.  Hypertensive urgency with headache -HA has now resolved  -CT head - no acute abnormalities -Continue home nebivolol (dose was lowered to 5 mg secondary to bradycardia), norvasc, HCTZ/ lisinopril( 2 tablets instead of 1 tablet daily). Cont newly added hydralazine, Imdur. -Following echocardiogram complete adjustment of BP medication    Hypokalemia -Repleted  Bradycardia -Heart rate in 50s -cont lower dose nabivolol 5mg  and discharge  Diabetes mellitus type  2 uncontrolled -Hemoglobin A1c 7.7   Normocytic Anemia  -Hgb 9.0- denies any hx/signs of bleed- likely due to chronic disease   Chronic compensated diastolic CHF -appears euvolemic -2-D echo pending  Coronary artery disease -Denies any chest pain - continue ASA, BP, ACEI   COPD -Stable  Hx of Cataract surgery    Glaucoma -cont eye drops  Consults   None  Significant Tests:  See full reports for all details    Ct Head Wo Contrast  02/20/2015   CLINICAL DATA:  Headache and hypertension. One day post cataract surgery  EXAM: CT HEAD WITHOUT CONTRAST  TECHNIQUE: Contiguous axial images were obtained from the base of the skull through the vertex without intravenous contrast.  COMPARISON:  None.  FINDINGS: There is mild diffuse atrophy. There is no intracranial mass, hemorrhage, extra-axial fluid collection, or midline shift. There is mild small vessel disease in the centra semiovale bilaterally. Elsewhere gray-white compartments appear normal. No acute infarct is apparent. The bony calvarium appears intact. The mastoid air cells are clear. There has been removal of the lens from the right eye region.  IMPRESSION: Mild atrophy with patchy periventricular small vessel disease. There is no intracranial mass, hemorrhage, or acute appearing infarct.   Electronically Signed   By: Lowella Grip III M.D.   On: 02/20/2015 14:18     Today   Subjective:   Krista Rosales today has no headache,no chest abdominal pain,no new weakness tingling or numbness, feels much better wants to go home today.   Objective:   Blood pressure 179/74, pulse 58, temperature 98.3 F (36.8 C), temperature source Oral, resp. rate 16, height 5\' 7"  (1.702 m), weight 80.241 kg (176 lb 14.4 oz), SpO2 97 %.  Intake/Output Summary (Last 24 hours) at 02/23/15 1255 Last data filed at 02/23/15 0842  Gross per 24 hour  Intake    600 ml  Output    600 ml  Net      0 ml    Exam Awake Alert, Oriented *3, No new F.N deficits, Normal affect [place].AT,PERRAL Supple Neck,No JVD, No cervical lymphadenopathy appriciated.  Symmetrical Chest wall movement, Good air movement bilaterally, CTAB RRR,No Gallops,Rubs or new Murmurs, No Parasternal Heave +ve B.Sounds, Abd Soft, Non tender, No organomegaly appriciated, No rebound -guarding or  rigidity. No Cyanosis, Clubbing or edema, No new Rash or bruise  Data Review   Cultures -   CBC w Diff:  Lab Results  Component Value Date   WBC 6.4 02/23/2015   HGB 9.7* 02/23/2015   HCT 29.2* 02/23/2015   PLT 310 02/23/2015   LYMPHOPCT 23 02/23/2015   MONOPCT 11 02/23/2015   EOSPCT 2 02/23/2015   BASOPCT 1 02/23/2015   CMP:  Lab Results  Component Value Date   NA 138 02/23/2015   K 3.8 02/23/2015   CL 105 02/23/2015   CO2 23 02/23/2015   BUN 15 02/23/2015   CREATININE 1.20* 02/23/2015   PROT 6.2 02/23/2015   ALBUMIN 3.2* 02/23/2015   BILITOT 0.4 02/23/2015   ALKPHOS 25* 02/23/2015   AST 13 02/23/2015   ALT 9 02/23/2015  .  Micro Results Recent Results (from the past 240 hour(s))  MRSA PCR Screening     Status: None   Collection Time: 02/20/15  4:15 PM  Result Value Ref Range Status   MRSA by PCR NEGATIVE NEGATIVE Final    Comment:        The GeneXpert MRSA Assay (FDA approved for NASAL specimens only), is one  component of a comprehensive MRSA colonization surveillance program. It is not intended to diagnose MRSA infection nor to guide or monitor treatment for MRSA infections.      Discharge Instructions          Follow-up Information    Follow up with GATES,ROBERT NEVILL, MD. Schedule an appointment as soon as possible for a visit in 1 week.   Specialty:  Internal Medicine   Why:  Posthospitalization follow-up for hypertension and repeat labs   Contact information:   [place] [address] [place] [address] [phone]       Follow up with [facility].   Why:  Home Health RN   Contact information:   [place] [address] [phone]       Discharge Medications     Medication List    TAKE these medications        acetaminophen 500 MG tablet  Commonly known as:  TYLENOL  Take 500 mg by mouth every 6 (six) hours as needed.     amLODipine 10 MG tablet  Commonly known as:   NORVASC  Take 10 mg by mouth daily.     aspirin 81 MG tablet  Take 81 mg by mouth daily.     Besifloxacin HCl 0.6 % Susp  Apply 1 drop to eye 3 (three) times daily. For 2 weeks     cholecalciferol 1000 UNITS tablet  Commonly known as:  VITAMIN D  Take 1,000 Units by mouth 2 (two) times daily.     Difluprednate 0.05 % Emul  Apply 1 drop to eye See admin instructions. 1 drop 3 times a day for 1 week, then 2 times a day for 1 week, and then 1 time a day for 1 week, then STOP     glimepiride 2 MG tablet  Commonly known as:  AMARYL  Take 2 mg by mouth daily with breakfast.     hydrALAZINE 100 MG tablet  Commonly known as:  APRESOLINE  Take 1 tablet (100 mg total) by mouth every 6 (six) hours.     isosorbide mononitrate 60 MG 24 hr tablet  Commonly known as:  IMDUR  Take 1 tablet (60 mg total) by mouth daily.     lisinopril-hydrochlorothiazide 20-12.5 MG per tablet  Commonly known as:  PRINZIDE,ZESTORETIC  Take 2 tablets by mouth daily.     loratadine 10 MG tablet  Commonly known as:  CLARITIN  Take 10 mg by mouth daily.     metFORMIN 1000 MG tablet  Commonly known as:  GLUCOPHAGE  Take 1,000 mg by mouth 2 (two) times daily with a meal.     mometasone-formoterol 100-5 MCG/ACT Aero  Commonly known as:  DULERA  Inhale 2 puffs into the lungs 2 (two) times daily as needed for wheezing.     nebivolol 5 MG tablet  Commonly known as:  BYSTOLIC  Take 1 tablet (5 mg total) by mouth daily.     Nepafenac 0.3 % Susp  Apply 1 drop to eye daily. For 4 weeks     pantoprazole 40 MG tablet  Commonly known as:  PROTONIX  Take 40 mg by mouth 3 (three) times daily.     vitamin B-12 500 MCG tablet  Commonly known as:  CYANOCOBALAMIN  Take 500 mcg by mouth daily.         Total Time in preparing paper work, data evaluation and todays exam - 35 minutes  Tamey Wanek M.D  on 02/23/2015 at 12:55 PM  [facility] Office  [phone]

## 2015-06-19 ENCOUNTER — Ambulatory Visit
Admission: RE | Admit: 2015-06-19 | Discharge: 2015-06-19 | Disposition: A | Discharge status: Home / Self Care | Payer: Medicare Other | Source: Ambulatory Visit | Attending: Internal Medicine | Admitting: Internal Medicine

## 2015-06-19 ENCOUNTER — Other Ambulatory Visit: Payer: Self-pay | Admitting: Internal Medicine

## 2015-06-19 DIAGNOSIS — R0602 Shortness of breath: Secondary | ICD-10-CM

## 2015-07-18 ENCOUNTER — Ambulatory Visit (HOSPITAL_COMMUNITY): Payer: Medicare Other

## 2015-07-27 ENCOUNTER — Other Ambulatory Visit: Payer: Self-pay

## 2015-07-27 ENCOUNTER — Other Ambulatory Visit (HOSPITAL_COMMUNITY): Payer: Self-pay | Admitting: Internal Medicine

## 2015-07-27 ENCOUNTER — Ambulatory Visit (HOSPITAL_COMMUNITY): Payer: Medicare Other | Attending: Cardiovascular Disease

## 2015-07-27 DIAGNOSIS — I509 Heart failure, unspecified: Secondary | ICD-10-CM

## 2015-07-27 DIAGNOSIS — I517 Cardiomegaly: Secondary | ICD-10-CM | POA: Diagnosis not present

## 2015-07-27 DIAGNOSIS — I10 Essential (primary) hypertension: Secondary | ICD-10-CM | POA: Insufficient documentation

## 2015-07-27 DIAGNOSIS — E785 Hyperlipidemia, unspecified: Secondary | ICD-10-CM | POA: Diagnosis not present

## 2015-07-27 DIAGNOSIS — E119 Type 2 diabetes mellitus without complications: Secondary | ICD-10-CM | POA: Insufficient documentation

## 2015-07-27 DIAGNOSIS — I340 Nonrheumatic mitral (valve) insufficiency: Secondary | ICD-10-CM | POA: Diagnosis not present

## 2016-01-22 DIAGNOSIS — J449 Chronic obstructive pulmonary disease, unspecified: Secondary | ICD-10-CM | POA: Diagnosis not present

## 2016-01-22 DIAGNOSIS — E8770 Fluid overload, unspecified: Secondary | ICD-10-CM | POA: Diagnosis not present

## 2016-01-22 DIAGNOSIS — G4700 Insomnia, unspecified: Secondary | ICD-10-CM | POA: Diagnosis not present

## 2016-01-22 DIAGNOSIS — I509 Heart failure, unspecified: Secondary | ICD-10-CM | POA: Diagnosis not present

## 2016-01-22 DIAGNOSIS — D649 Anemia, unspecified: Secondary | ICD-10-CM | POA: Diagnosis not present

## 2016-01-22 DIAGNOSIS — E1142 Type 2 diabetes mellitus with diabetic polyneuropathy: Secondary | ICD-10-CM | POA: Diagnosis not present

## 2016-01-22 DIAGNOSIS — E1121 Type 2 diabetes mellitus with diabetic nephropathy: Secondary | ICD-10-CM | POA: Diagnosis not present

## 2016-01-22 DIAGNOSIS — I10 Essential (primary) hypertension: Secondary | ICD-10-CM | POA: Diagnosis not present

## 2016-01-22 DIAGNOSIS — F419 Anxiety disorder, unspecified: Secondary | ICD-10-CM | POA: Diagnosis not present

## 2016-01-26 DIAGNOSIS — H5210 Myopia, unspecified eye: Secondary | ICD-10-CM | POA: Diagnosis not present

## 2016-01-26 DIAGNOSIS — H524 Presbyopia: Secondary | ICD-10-CM | POA: Diagnosis not present

## 2016-02-19 DIAGNOSIS — G4700 Insomnia, unspecified: Secondary | ICD-10-CM | POA: Diagnosis not present

## 2016-02-19 DIAGNOSIS — F419 Anxiety disorder, unspecified: Secondary | ICD-10-CM | POA: Diagnosis not present

## 2016-02-19 DIAGNOSIS — I10 Essential (primary) hypertension: Secondary | ICD-10-CM | POA: Diagnosis not present

## 2016-05-22 DIAGNOSIS — Z6824 Body mass index (BMI) 24.0-24.9, adult: Secondary | ICD-10-CM | POA: Diagnosis not present

## 2016-05-22 DIAGNOSIS — F419 Anxiety disorder, unspecified: Secondary | ICD-10-CM | POA: Diagnosis not present

## 2016-05-22 DIAGNOSIS — G4700 Insomnia, unspecified: Secondary | ICD-10-CM | POA: Diagnosis not present

## 2016-05-22 DIAGNOSIS — E1121 Type 2 diabetes mellitus with diabetic nephropathy: Secondary | ICD-10-CM | POA: Diagnosis not present

## 2016-05-22 DIAGNOSIS — I509 Heart failure, unspecified: Secondary | ICD-10-CM | POA: Diagnosis not present

## 2016-05-22 DIAGNOSIS — I10 Essential (primary) hypertension: Secondary | ICD-10-CM | POA: Diagnosis not present

## 2016-05-22 DIAGNOSIS — J449 Chronic obstructive pulmonary disease, unspecified: Secondary | ICD-10-CM | POA: Diagnosis not present

## 2016-05-22 DIAGNOSIS — E1142 Type 2 diabetes mellitus with diabetic polyneuropathy: Secondary | ICD-10-CM | POA: Diagnosis not present

## 2016-05-22 DIAGNOSIS — Z7984 Long term (current) use of oral hypoglycemic drugs: Secondary | ICD-10-CM | POA: Diagnosis not present

## 2016-05-22 DIAGNOSIS — B351 Tinea unguium: Secondary | ICD-10-CM | POA: Diagnosis not present

## 2016-06-23 DIAGNOSIS — E1142 Type 2 diabetes mellitus with diabetic polyneuropathy: Secondary | ICD-10-CM | POA: Diagnosis not present

## 2016-06-23 DIAGNOSIS — Z7984 Long term (current) use of oral hypoglycemic drugs: Secondary | ICD-10-CM | POA: Diagnosis not present

## 2016-07-07 DIAGNOSIS — E119 Type 2 diabetes mellitus without complications: Secondary | ICD-10-CM | POA: Diagnosis not present

## 2016-07-07 DIAGNOSIS — R809 Proteinuria, unspecified: Secondary | ICD-10-CM | POA: Diagnosis not present

## 2016-07-07 DIAGNOSIS — I10 Essential (primary) hypertension: Secondary | ICD-10-CM | POA: Diagnosis not present

## 2016-07-21 DIAGNOSIS — Z79899 Other long term (current) drug therapy: Secondary | ICD-10-CM | POA: Diagnosis not present

## 2016-07-21 DIAGNOSIS — E559 Vitamin D deficiency, unspecified: Secondary | ICD-10-CM | POA: Diagnosis not present

## 2016-07-21 DIAGNOSIS — Z0001 Encounter for general adult medical examination with abnormal findings: Secondary | ICD-10-CM | POA: Diagnosis not present

## 2016-07-21 DIAGNOSIS — J449 Chronic obstructive pulmonary disease, unspecified: Secondary | ICD-10-CM | POA: Diagnosis not present

## 2016-07-21 DIAGNOSIS — E1142 Type 2 diabetes mellitus with diabetic polyneuropathy: Secondary | ICD-10-CM | POA: Diagnosis not present

## 2016-07-21 DIAGNOSIS — Z1389 Encounter for screening for other disorder: Secondary | ICD-10-CM | POA: Diagnosis not present

## 2016-07-21 DIAGNOSIS — E1121 Type 2 diabetes mellitus with diabetic nephropathy: Secondary | ICD-10-CM | POA: Diagnosis not present

## 2016-07-21 DIAGNOSIS — I10 Essential (primary) hypertension: Secondary | ICD-10-CM | POA: Diagnosis not present

## 2016-07-21 DIAGNOSIS — B351 Tinea unguium: Secondary | ICD-10-CM | POA: Diagnosis not present

## 2016-07-21 DIAGNOSIS — I509 Heart failure, unspecified: Secondary | ICD-10-CM | POA: Diagnosis not present

## 2016-07-21 DIAGNOSIS — D81818 Other biotin-dependent carboxylase deficiency: Secondary | ICD-10-CM | POA: Diagnosis not present

## 2016-07-29 DIAGNOSIS — M179 Osteoarthritis of knee, unspecified: Secondary | ICD-10-CM | POA: Diagnosis not present

## 2016-07-29 DIAGNOSIS — E119 Type 2 diabetes mellitus without complications: Secondary | ICD-10-CM | POA: Diagnosis not present

## 2016-07-29 DIAGNOSIS — F119 Opioid use, unspecified: Secondary | ICD-10-CM | POA: Diagnosis not present

## 2016-08-04 DIAGNOSIS — Z7984 Long term (current) use of oral hypoglycemic drugs: Secondary | ICD-10-CM | POA: Diagnosis not present

## 2016-08-04 DIAGNOSIS — E1142 Type 2 diabetes mellitus with diabetic polyneuropathy: Secondary | ICD-10-CM | POA: Diagnosis not present

## 2016-08-04 DIAGNOSIS — B351 Tinea unguium: Secondary | ICD-10-CM | POA: Diagnosis not present

## 2016-08-04 DIAGNOSIS — D649 Anemia, unspecified: Secondary | ICD-10-CM | POA: Diagnosis not present

## 2016-08-07 DIAGNOSIS — Z1211 Encounter for screening for malignant neoplasm of colon: Secondary | ICD-10-CM | POA: Diagnosis not present

## 2016-08-07 DIAGNOSIS — Z1212 Encounter for screening for malignant neoplasm of rectum: Secondary | ICD-10-CM | POA: Diagnosis not present

## 2016-10-07 DIAGNOSIS — J449 Chronic obstructive pulmonary disease, unspecified: Secondary | ICD-10-CM | POA: Diagnosis not present

## 2016-10-07 DIAGNOSIS — I10 Essential (primary) hypertension: Secondary | ICD-10-CM | POA: Diagnosis not present

## 2016-10-07 DIAGNOSIS — B351 Tinea unguium: Secondary | ICD-10-CM | POA: Diagnosis not present

## 2016-10-07 DIAGNOSIS — F419 Anxiety disorder, unspecified: Secondary | ICD-10-CM | POA: Diagnosis not present

## 2016-10-07 DIAGNOSIS — D649 Anemia, unspecified: Secondary | ICD-10-CM | POA: Diagnosis not present

## 2016-10-07 DIAGNOSIS — I509 Heart failure, unspecified: Secondary | ICD-10-CM | POA: Diagnosis not present

## 2016-10-07 DIAGNOSIS — G4700 Insomnia, unspecified: Secondary | ICD-10-CM | POA: Diagnosis not present

## 2016-10-07 DIAGNOSIS — E1121 Type 2 diabetes mellitus with diabetic nephropathy: Secondary | ICD-10-CM | POA: Diagnosis not present

## 2016-10-07 DIAGNOSIS — E1142 Type 2 diabetes mellitus with diabetic polyneuropathy: Secondary | ICD-10-CM | POA: Diagnosis not present

## 2016-10-21 DIAGNOSIS — I10 Essential (primary) hypertension: Secondary | ICD-10-CM | POA: Diagnosis not present

## 2016-10-22 DIAGNOSIS — Z7984 Long term (current) use of oral hypoglycemic drugs: Secondary | ICD-10-CM | POA: Diagnosis not present

## 2016-10-22 DIAGNOSIS — I10 Essential (primary) hypertension: Secondary | ICD-10-CM | POA: Diagnosis not present

## 2016-10-22 DIAGNOSIS — E1142 Type 2 diabetes mellitus with diabetic polyneuropathy: Secondary | ICD-10-CM | POA: Diagnosis not present

## 2016-10-22 DIAGNOSIS — E1121 Type 2 diabetes mellitus with diabetic nephropathy: Secondary | ICD-10-CM | POA: Diagnosis not present

## 2016-11-27 DIAGNOSIS — I10 Essential (primary) hypertension: Secondary | ICD-10-CM | POA: Diagnosis not present

## 2016-11-27 DIAGNOSIS — Z7984 Long term (current) use of oral hypoglycemic drugs: Secondary | ICD-10-CM | POA: Diagnosis not present

## 2016-11-27 DIAGNOSIS — R0602 Shortness of breath: Secondary | ICD-10-CM | POA: Diagnosis not present

## 2016-11-27 DIAGNOSIS — I509 Heart failure, unspecified: Secondary | ICD-10-CM | POA: Diagnosis not present

## 2016-11-27 DIAGNOSIS — E1142 Type 2 diabetes mellitus with diabetic polyneuropathy: Secondary | ICD-10-CM | POA: Diagnosis not present

## 2016-12-17 NOTE — Progress Notes (Signed)
HPI: 82 year old female for evaluation of Chest pain. Echo 8/16 showed normal LV function, grade 2 diastolic dysfunction, mild AS with mean gradient 10 mmHg, mild MR, severe LAE, mild RAE/RVE. Patient does have some dyspnea on exertion but no orthopnea, PND, pedal edema or syncope. She has occasional pain in her chest described as a sharp pain radiating to her left upper extremity. Lasts seconds. Not exertional. Not positional. No associated symptoms. Resolves spontaneously. No exertional chest pain.  Current Outpatient Prescriptions  Medication Sig Dispense Refill  . acetaminophen (TYLENOL) 500 MG tablet Take 500 mg by mouth every 6 (six) hours as needed.    Marland Kitchen amLODipine (NORVASC) 10 MG tablet Take 10 mg by mouth daily.    Marland Kitchen aspirin 81 MG tablet Take 81 mg by mouth daily.    . cholecalciferol (VITAMIN D) 1000 UNITS tablet Take 1,000 Units by mouth 2 (two) times daily.    . cloNIDine (CATAPRES) 0.1 MG tablet Take 0.1 mg by mouth 3 (three) times daily.    . Difluprednate 0.05 % EMUL Apply 1 drop to eye See admin instructions. 1 drop 3 times a day for 1 week, then 2 times a day for 1 week, and then 1 time a day for 1 week, then STOP    . glimepiride (AMARYL) 2 MG tablet Take 2 mg by mouth daily with breakfast.    . hydrALAZINE (APRESOLINE) 100 MG tablet Take 1 tablet (100 mg total) by mouth every 6 (six) hours. 120 tablet 0  . isosorbide mononitrate (IMDUR) 60 MG 24 hr tablet Take 1 tablet (60 mg total) by mouth daily. 30 tablet 0  . lisinopril-hydrochlorothiazide (PRINZIDE,ZESTORETIC) 20-12.5 MG per tablet Take 2 tablets by mouth daily. 30 tablet 0  . metFORMIN (GLUCOPHAGE) 1000 MG tablet Take 1,000 mg by mouth 2 (two) times daily with a meal.    . nebivolol (BYSTOLIC) 5 MG tablet Take 1 tablet (5 mg total) by mouth daily. 30 tablet 0  . spironolactone (ALDACTONE) 25 MG tablet Take 25 mg by mouth daily.    . vitamin B-12 (CYANOCOBALAMIN) 500 MCG tablet Take 500 mcg by mouth daily.     No  current facility-administered medications for this visit.     Allergies  Allergen Reactions  . Codeine   . Penicillins   . Wellbutrin [Bupropion]      Past Medical History:  Diagnosis Date  . ANEMIA, IRON DEFICIENCY 05/07/2009   Qualifier: Diagnosis of  By: Loanne Drilling MD, Jacelyn Pi   . ARTHRITIS 05/30/2008   Qualifier: Diagnosis of  By: Marland Mcalpine    . DIABETES MELLITUS, TYPE II 07/16/2007   Qualifier: Diagnosis of  By: Marca Ancona RMA, Lucy    . DIVERTICULOSIS, COLON 05/30/2008   Qualifier: Diagnosis of  By: Marland Mcalpine    . Glaucoma   . HYPERLIPIDEMIA 07/16/2007   Qualifier: Diagnosis of  By: Reatha Armour, Lucy    . Hypertension     Past Surgical History:  Procedure Laterality Date  . ABDOMINAL HYSTERECTOMY      Social History   Social History  . Marital status: Widowed    Spouse name: N/A  . Number of children: N/A  . Years of education: N/A   Occupational History  . Not on file.   Social History Main Topics  . Smoking status: Former Research scientist (life sciences)  . Smokeless tobacco: Never Used  . Alcohol use No  . Drug use: No  . Sexual activity: Not on file   Other  Topics Concern  . Not on file   Social History Narrative   Widowed.  No children.  Has a brother.      Family History  Problem Relation Age of Onset  . Diabetes Mellitus II    . Pancreatic cancer Mother   . Heart disease Father   . CAD Brother     ROS: no fevers or chills, productive cough, hemoptysis, dysphasia, odynophagia, melena, hematochezia, dysuria, hematuria, rash, seizure activity, orthopnea, PND, pedal edema, claudication. Remaining systems are negative.  Physical Exam:   Blood pressure (!) 172/52, pulse (!) 52, height 5\' 11"  (1.803 m), weight 170 lb (77.1 kg).  General:  Well developed/well nourished in NAD Skin warm/dry Patient not depressed No peripheral clubbing Back-normal HEENT-normal/normal eyelids Neck supple/normal carotid upstroke bilaterally; no bruits; no JVD; no  thyromegaly chest - CTA/ normal expansion CV - RRR/normal S1 and S2; no rubs or gallops;  PMI nondisplaced; 2/6 systolic murmur Abdomen -NT/ND, no HSM, no mass, + bowel sounds, no bruit 2+ femoral pulses, no bruits Ext-trace edema, no chords, 2+ DP Neuro-grossly nonfocal  ECG Sinus bradycardia at a rate of 52. Left anterior fascicular block. Poor R-wave progression. Nonspecific ST changes.  A/P  1 chest pain-symptoms are atypical but multiple risk factors including diabetes mellitus. Also with DOE.She has difficulty ambulating on the treadmill. We will arrange a [place] nuclear study for risk stratification.  2 aortic stenosis-mild on echocardiogram approximately 16 months ago. It continues to sound mild on examination. S2 is not diminished. We will likely repeat her echocardiogram when she returns in 1 year.  3 hypertension-blood pressure is elevated. We will continue with present medications and follow. May need to consider minoxidil in the future. This is also followed by primary care.  4 diabetes mellitus-continue present medications.  Kirk Ruths, MD

## 2016-12-19 ENCOUNTER — Encounter (HOSPITAL_COMMUNITY): Payer: Self-pay | Admitting: Emergency Medicine

## 2016-12-19 ENCOUNTER — Ambulatory Visit (HOSPITAL_COMMUNITY)
Admission: EM | Admit: 2016-12-19 | Discharge: 2016-12-19 | Disposition: A | Discharge status: Home / Self Care | Payer: Commercial Managed Care - HMO | Attending: Family Medicine | Admitting: Family Medicine

## 2016-12-19 DIAGNOSIS — L0291 Cutaneous abscess, unspecified: Secondary | ICD-10-CM

## 2016-12-19 DIAGNOSIS — N907 Vulvar cyst: Secondary | ICD-10-CM | POA: Diagnosis not present

## 2016-12-19 MED ORDER — DOXYCYCLINE HYCLATE 100 MG PO CAPS: 100.0000 mg | ORAL_CAPSULE | Freq: Two times a day (BID) | ORAL | 0 refills | Status: DC

## 2016-12-19 MED ORDER — SULFAMETHOXAZOLE-TRIMETHOPRIM 400-80 MG PO TABS: 1.0000 | ORAL_TABLET | Freq: Two times a day (BID) | ORAL | 0 refills | Status: DC

## 2016-12-19 NOTE — ED Triage Notes (Signed)
Pt has had a pea sized bump near her rectum for about three weeks.  She noticed it became bigger over the weekend.  She states she only has pain when wiping with toilet paper.  Pt denies any fever or drainage from the bump.

## 2016-12-19 NOTE — ED Provider Notes (Signed)
CSN: [id number]     Arrival date & time 12/19/16  1031 History   None    Chief Complaint  Patient presents with  . Mass   (Consider location/radiation/quality/duration/timing/severity/associated sxs/prior Treatment) Patient has cyst on her right labia/vulva for last week.  She states it is draining.   The history is provided by the patient.  Abscess  Size:  2 cm  Abscess quality: draining and fluctuance   Red streaking: no   Duration:  1 week Progression:  Worsening   Past Medical History:  Diagnosis Date  . ANEMIA, IRON DEFICIENCY 05/07/2009   Qualifier: Diagnosis of  By: Loanne Drilling MD, Jacelyn Pi   . ARTHRITIS 05/30/2008   Qualifier: Diagnosis of  By: Marland Mcalpine    . CORONARY ARTERY DISEASE 07/16/2007   Qualifier: Diagnosis of  By: Marca Ancona RMA, Lucy    . DIABETES MELLITUS, TYPE II 07/16/2007   Qualifier: Diagnosis of  By: Marca Ancona RMA, Lucy    . DIVERTICULOSIS, COLON 05/30/2008   Qualifier: Diagnosis of  By: Marland Mcalpine    . Glaucoma   . HYPERLIPIDEMIA 07/16/2007   Qualifier: Diagnosis of  By: Reatha Armour, Lucy    . Hypertension    History reviewed. No pertinent surgical history. Family History  Problem Relation Age of Onset  . Diabetes Mellitus II    . Pancreatic cancer Mother   . Heart disease Father    Social History  Substance Use Topics  . Smoking status: Former Research scientist (life sciences)  . Smokeless tobacco: Never Used  . Alcohol use No   OB History    No data available     Review of Systems  Constitutional: Negative.   HENT: Negative.   Eyes: Negative.   Respiratory: Negative.   Cardiovascular: Negative.   Gastrointestinal: Negative.   Endocrine: Negative.   Genitourinary: Negative.   Musculoskeletal: Negative.   Skin: Positive for wound.  Allergic/Immunologic: Negative.   Neurological: Negative.   Hematological: Negative.   Psychiatric/Behavioral: Negative.     Allergies  Codeine; Penicillins; and Wellbutrin [bupropion]  Home Medications   Prior to  Admission medications   Medication Sig Start Date End Date Taking? Authorizing Provider  aspirin 81 MG tablet Take 81 mg by mouth daily.   Yes Historical Provider, MD  cloNIDine (CATAPRES) 0.1 MG tablet Take 0.1 mg by mouth 3 (three) times daily.   Yes Historical Provider, MD  glimepiride (AMARYL) 2 MG tablet Take 2 mg by mouth daily with breakfast.   Yes Historical Provider, MD  hydrALAZINE (APRESOLINE) 100 MG tablet Take 1 tablet (100 mg total) by mouth every 6 (six) hours. 02/23/15  Yes Albertine Patricia, MD  lisinopril-hydrochlorothiazide (PRINZIDE,ZESTORETIC) 20-12.5 MG per tablet Take 2 tablets by mouth daily. 02/23/15  Yes Albertine Patricia, MD  metFORMIN (GLUCOPHAGE) 1000 MG tablet Take 1,000 mg by mouth 2 (two) times daily with a meal.   Yes Historical Provider, MD  nebivolol (BYSTOLIC) 5 MG tablet Take 1 tablet (5 mg total) by mouth daily. 02/23/15  Yes Albertine Patricia, MD  spironolactone (ALDACTONE) 25 MG tablet Take 25 mg by mouth daily.   Yes Historical Provider, MD  acetaminophen (TYLENOL) 500 MG tablet Take 500 mg by mouth every 6 (six) hours as needed.    Historical Provider, MD  amLODipine (NORVASC) 10 MG tablet Take 10 mg by mouth daily.    Historical Provider, MD  Besifloxacin HCl 0.6 % SUSP Apply 1 drop to eye 3 (three) times daily. For 2 weeks  Historical Provider, MD  cholecalciferol (VITAMIN D) 1000 UNITS tablet Take 1,000 Units by mouth 2 (two) times daily.    Historical Provider, MD  Difluprednate 0.05 % EMUL Apply 1 drop to eye See admin instructions. 1 drop 3 times a day for 1 week, then 2 times a day for 1 week, and then 1 time a day for 1 week, then STOP    Historical Provider, MD  doxycycline (VIBRAMYCIN) 100 MG capsule Take 1 capsule (100 mg total) by mouth 2 (two) times daily. 12/19/16   Lysbeth Penner, FNP  isosorbide mononitrate (IMDUR) 60 MG 24 hr tablet Take 1 tablet (60 mg total) by mouth daily. 02/23/15   Silver Huguenin Elgergawy, MD  loratadine (CLARITIN) 10 MG  tablet Take 10 mg by mouth daily.    Historical Provider, MD  mometasone-formoterol (DULERA) 100-5 MCG/ACT AERO Inhale 2 puffs into the lungs 2 (two) times daily as needed for wheezing.    Historical Provider, MD  Nepafenac 0.3 % SUSP Apply 1 drop to eye daily. For 4 weeks    Historical Provider, MD  pantoprazole (PROTONIX) 40 MG tablet Take 40 mg by mouth 3 (three) times daily.    Historical Provider, MD  vitamin B-12 (CYANOCOBALAMIN) 500 MCG tablet Take 500 mcg by mouth daily.    Historical Provider, MD   Meds Ordered and Administered this Visit  Medications - No data to display  BP 190/70 (BP Location: Left Arm)   Pulse (!) 59   Temp 98.5 F (36.9 C) (Oral)   SpO2 100%  No data found.   Physical Exam  Constitutional: She appears well-developed and well-nourished.  HENT:  Head: Normocephalic and atraumatic.  Eyes: Conjunctivae and EOM are normal. Pupils are equal, round, and reactive to light.  Neck: Normal range of motion. Neck supple.  Cardiovascular: Normal rate, regular rhythm and normal heart sounds.   Pulmonary/Chest: Effort normal and breath sounds normal.  Skin:  Right labial cyst with purulent drainage draining.  Nursing note and vitals reviewed.   Urgent Care Course   Clinical Course     .Marland KitchenIncision and Drainage Date/Time: 12/19/2016 11:29 AM Performed by: Lysbeth Penner Authorized by: Ihor Gully D   Consent:    Consent obtained:  Verbal   Consent given by:  Patient   Risks discussed:  Bleeding and incomplete drainage   Alternatives discussed:  No treatment Location:    Type:  Abscess   Size:  2 cm Pre-procedure details:    Skin preparation:  Betadine Procedure type:    Complexity:  Simple Procedure details:    Needle aspiration: no     Incision types:  Stab incision   Scalpel blade:  11   Drainage:  Purulent and serosanguinous   Drainage amount:  Moderate   Wound treatment:  Wound left open   Packing materials:  None Post-procedure details:     Patient tolerance of procedure:  Tolerated well, no immediate complications Comments:     Right labial abscess anesthetized with 2% lidocaine with epi 3 cc's    (including critical care time)  Labs Review Labs Reviewed - No data to display  Imaging Review No results found.   Visual Acuity Review  Right Eye Distance:   Left Eye Distance:   Bilateral Distance:    Right Eye Near:   Left Eye Near:    Bilateral Near:         MDM   1. Vulvar cyst   2. Abscess    Incision  and Drainage right vulva Septra one po bid x 7 days #14 Explained to patient to follow up prn      Lysbeth Penner, FNP 12/19/16 1132

## 2016-12-26 ENCOUNTER — Ambulatory Visit (INDEPENDENT_AMBULATORY_CARE_PROVIDER_SITE_OTHER): Payer: Commercial Managed Care - HMO | Admitting: Cardiology

## 2016-12-26 ENCOUNTER — Encounter: Payer: Self-pay | Admitting: Cardiology

## 2016-12-26 VITALS — BP 172/52 | HR 52 | Ht 71.0 in | Wt 170.0 lb

## 2016-12-26 DIAGNOSIS — R072 Precordial pain: Secondary | ICD-10-CM | POA: Diagnosis not present

## 2016-12-26 DIAGNOSIS — I10 Essential (primary) hypertension: Secondary | ICD-10-CM

## 2016-12-26 DIAGNOSIS — R0600 Dyspnea, unspecified: Secondary | ICD-10-CM

## 2016-12-26 NOTE — Patient Instructions (Signed)
Medication Instructions:   NO CHANGE  Testing/Procedures:  Your physician has requested that you have a lexiscan myoview. For further information please visit [email or web address]. Please follow instruction sheet, as given.    Follow-Up:  Your physician wants you to follow-up in: ONE YEAR WITH DR CRENSHAW You will receive a reminder letter in the mail two months in advance. If you don't receive a letter, please call our office to schedule the follow-up appointment.      

## 2016-12-30 ENCOUNTER — Inpatient Hospital Stay (HOSPITAL_COMMUNITY): Admission: RE | Admit: 2016-12-30 | Payer: Commercial Managed Care - HMO | Source: Ambulatory Visit

## 2017-01-02 ENCOUNTER — Telehealth (HOSPITAL_COMMUNITY): Payer: Self-pay

## 2017-01-02 NOTE — Telephone Encounter (Signed)
Encounter complete. 

## 2017-01-07 ENCOUNTER — Encounter (HOSPITAL_COMMUNITY): Payer: Commercial Managed Care - HMO

## 2017-01-09 ENCOUNTER — Telehealth (HOSPITAL_COMMUNITY): Payer: Self-pay

## 2017-01-09 NOTE — Telephone Encounter (Signed)
Encounter complete. 

## 2017-01-14 ENCOUNTER — Inpatient Hospital Stay (HOSPITAL_COMMUNITY): Admission: RE | Admit: 2017-01-14 | Payer: Commercial Managed Care - HMO | Source: Ambulatory Visit

## 2017-02-11 ENCOUNTER — Telehealth (HOSPITAL_COMMUNITY): Payer: Self-pay | Admitting: Cardiology

## 2017-02-11 NOTE — Telephone Encounter (Signed)
Pt has cancelled her appts on 12/30/16, and 01/07/17 and no showed for appt on 01/14/17. She will be removed from the workqueue due to number of times cancelled.

## 2017-02-11 NOTE — Telephone Encounter (Signed)
aware

## 2017-02-12 DIAGNOSIS — E559 Vitamin D deficiency, unspecified: Secondary | ICD-10-CM | POA: Diagnosis not present

## 2017-02-12 DIAGNOSIS — I509 Heart failure, unspecified: Secondary | ICD-10-CM | POA: Diagnosis not present

## 2017-02-12 DIAGNOSIS — E1142 Type 2 diabetes mellitus with diabetic polyneuropathy: Secondary | ICD-10-CM | POA: Diagnosis not present

## 2017-02-12 DIAGNOSIS — I10 Essential (primary) hypertension: Secondary | ICD-10-CM | POA: Diagnosis not present

## 2017-02-12 DIAGNOSIS — R0602 Shortness of breath: Secondary | ICD-10-CM | POA: Diagnosis not present

## 2017-02-12 DIAGNOSIS — Z7984 Long term (current) use of oral hypoglycemic drugs: Secondary | ICD-10-CM | POA: Diagnosis not present

## 2017-02-12 DIAGNOSIS — D81818 Other biotin-dependent carboxylase deficiency: Secondary | ICD-10-CM | POA: Diagnosis not present

## 2017-02-27 DIAGNOSIS — L299 Pruritus, unspecified: Secondary | ICD-10-CM | POA: Diagnosis not present

## 2017-04-30 DIAGNOSIS — I10 Essential (primary) hypertension: Secondary | ICD-10-CM | POA: Diagnosis not present

## 2017-04-30 DIAGNOSIS — R600 Localized edema: Secondary | ICD-10-CM | POA: Diagnosis not present

## 2017-05-22 DIAGNOSIS — K5900 Constipation, unspecified: Secondary | ICD-10-CM | POA: Diagnosis not present

## 2017-05-22 DIAGNOSIS — L720 Epidermal cyst: Secondary | ICD-10-CM | POA: Diagnosis not present

## 2017-05-22 DIAGNOSIS — I10 Essential (primary) hypertension: Secondary | ICD-10-CM | POA: Diagnosis not present

## 2017-05-22 DIAGNOSIS — R600 Localized edema: Secondary | ICD-10-CM | POA: Diagnosis not present

## 2017-06-15 DIAGNOSIS — I10 Essential (primary) hypertension: Secondary | ICD-10-CM | POA: Diagnosis not present

## 2017-06-17 DIAGNOSIS — E119 Type 2 diabetes mellitus without complications: Secondary | ICD-10-CM | POA: Diagnosis not present

## 2017-06-17 DIAGNOSIS — M179 Osteoarthritis of knee, unspecified: Secondary | ICD-10-CM | POA: Diagnosis not present

## 2017-06-26 DIAGNOSIS — N183 Chronic kidney disease, stage 3 (moderate): Secondary | ICD-10-CM | POA: Diagnosis not present

## 2017-06-26 DIAGNOSIS — R809 Proteinuria, unspecified: Secondary | ICD-10-CM | POA: Diagnosis not present

## 2017-06-26 DIAGNOSIS — I10 Essential (primary) hypertension: Secondary | ICD-10-CM | POA: Diagnosis not present

## 2017-06-26 DIAGNOSIS — E119 Type 2 diabetes mellitus without complications: Secondary | ICD-10-CM | POA: Diagnosis not present

## 2017-07-03 DIAGNOSIS — I10 Essential (primary) hypertension: Secondary | ICD-10-CM | POA: Diagnosis not present

## 2017-07-14 DIAGNOSIS — I10 Essential (primary) hypertension: Secondary | ICD-10-CM | POA: Diagnosis not present

## 2017-07-24 DIAGNOSIS — D81818 Other biotin-dependent carboxylase deficiency: Secondary | ICD-10-CM | POA: Diagnosis not present

## 2017-07-24 DIAGNOSIS — I10 Essential (primary) hypertension: Secondary | ICD-10-CM | POA: Diagnosis not present

## 2017-07-24 DIAGNOSIS — Z1389 Encounter for screening for other disorder: Secondary | ICD-10-CM | POA: Diagnosis not present

## 2017-07-24 DIAGNOSIS — Z0000 Encounter for general adult medical examination without abnormal findings: Secondary | ICD-10-CM | POA: Diagnosis not present

## 2017-07-24 DIAGNOSIS — L720 Epidermal cyst: Secondary | ICD-10-CM | POA: Diagnosis not present

## 2017-07-24 DIAGNOSIS — K5900 Constipation, unspecified: Secondary | ICD-10-CM | POA: Diagnosis not present

## 2017-07-24 DIAGNOSIS — J449 Chronic obstructive pulmonary disease, unspecified: Secondary | ICD-10-CM | POA: Diagnosis not present

## 2017-07-24 DIAGNOSIS — E1121 Type 2 diabetes mellitus with diabetic nephropathy: Secondary | ICD-10-CM | POA: Diagnosis not present

## 2017-07-24 DIAGNOSIS — E1165 Type 2 diabetes mellitus with hyperglycemia: Secondary | ICD-10-CM | POA: Diagnosis not present

## 2017-07-24 DIAGNOSIS — E1142 Type 2 diabetes mellitus with diabetic polyneuropathy: Secondary | ICD-10-CM | POA: Diagnosis not present

## 2017-08-14 DIAGNOSIS — E119 Type 2 diabetes mellitus without complications: Secondary | ICD-10-CM | POA: Diagnosis not present

## 2017-08-14 DIAGNOSIS — H524 Presbyopia: Secondary | ICD-10-CM | POA: Diagnosis not present

## 2017-09-21 DIAGNOSIS — I5030 Unspecified diastolic (congestive) heart failure: Secondary | ICD-10-CM

## 2017-09-21 HISTORY — DX: Unspecified diastolic (congestive) heart failure: I50.30

## 2017-09-23 ENCOUNTER — Other Ambulatory Visit: Payer: Self-pay | Admitting: Nurse Practitioner

## 2017-09-23 ENCOUNTER — Ambulatory Visit
Admission: RE | Admit: 2017-09-23 | Discharge: 2017-09-23 | Disposition: A | Discharge status: Home / Self Care | Payer: Medicare HMO | Source: Ambulatory Visit | Attending: Nurse Practitioner | Admitting: Nurse Practitioner

## 2017-09-23 DIAGNOSIS — I10 Essential (primary) hypertension: Secondary | ICD-10-CM | POA: Diagnosis not present

## 2017-09-23 DIAGNOSIS — R0602 Shortness of breath: Secondary | ICD-10-CM | POA: Diagnosis not present

## 2017-09-23 DIAGNOSIS — J90 Pleural effusion, not elsewhere classified: Secondary | ICD-10-CM | POA: Diagnosis not present

## 2017-09-23 DIAGNOSIS — R05 Cough: Secondary | ICD-10-CM | POA: Diagnosis not present

## 2017-09-30 ENCOUNTER — Inpatient Hospital Stay (HOSPITAL_COMMUNITY)
Admission: EM | Admit: 2017-09-30 | Discharge: 2017-10-03 | DRG: 292 | Disposition: A | Discharge status: Home / Self Care | Payer: Medicare HMO | Attending: Internal Medicine | Admitting: Internal Medicine

## 2017-09-30 ENCOUNTER — Emergency Department (HOSPITAL_COMMUNITY): Payer: Medicare HMO

## 2017-09-30 ENCOUNTER — Encounter (HOSPITAL_COMMUNITY): Payer: Self-pay | Admitting: Emergency Medicine

## 2017-09-30 DIAGNOSIS — I471 Supraventricular tachycardia: Secondary | ICD-10-CM | POA: Diagnosis not present

## 2017-09-30 DIAGNOSIS — Z7982 Long term (current) use of aspirin: Secondary | ICD-10-CM

## 2017-09-30 DIAGNOSIS — D649 Anemia, unspecified: Secondary | ICD-10-CM | POA: Diagnosis not present

## 2017-09-30 DIAGNOSIS — I160 Hypertensive urgency: Secondary | ICD-10-CM | POA: Diagnosis not present

## 2017-09-30 DIAGNOSIS — Z7984 Long term (current) use of oral hypoglycemic drugs: Secondary | ICD-10-CM | POA: Diagnosis not present

## 2017-09-30 DIAGNOSIS — R001 Bradycardia, unspecified: Secondary | ICD-10-CM | POA: Diagnosis present

## 2017-09-30 DIAGNOSIS — I509 Heart failure, unspecified: Secondary | ICD-10-CM | POA: Diagnosis not present

## 2017-09-30 DIAGNOSIS — N179 Acute kidney failure, unspecified: Secondary | ICD-10-CM | POA: Diagnosis present

## 2017-09-30 DIAGNOSIS — E785 Hyperlipidemia, unspecified: Secondary | ICD-10-CM | POA: Diagnosis present

## 2017-09-30 DIAGNOSIS — I5031 Acute diastolic (congestive) heart failure: Secondary | ICD-10-CM | POA: Diagnosis not present

## 2017-09-30 DIAGNOSIS — I5033 Acute on chronic diastolic (congestive) heart failure: Secondary | ICD-10-CM | POA: Diagnosis present

## 2017-09-30 DIAGNOSIS — R0602 Shortness of breath: Secondary | ICD-10-CM | POA: Diagnosis not present

## 2017-09-30 DIAGNOSIS — I5030 Unspecified diastolic (congestive) heart failure: Secondary | ICD-10-CM | POA: Diagnosis present

## 2017-09-30 DIAGNOSIS — J90 Pleural effusion, not elsewhere classified: Secondary | ICD-10-CM | POA: Diagnosis not present

## 2017-09-30 DIAGNOSIS — Z79899 Other long term (current) drug therapy: Secondary | ICD-10-CM

## 2017-09-30 DIAGNOSIS — I169 Hypertensive crisis, unspecified: Secondary | ICD-10-CM | POA: Diagnosis not present

## 2017-09-30 DIAGNOSIS — I340 Nonrheumatic mitral (valve) insufficiency: Secondary | ICD-10-CM | POA: Diagnosis not present

## 2017-09-30 DIAGNOSIS — I110 Hypertensive heart disease with heart failure: Principal | ICD-10-CM | POA: Diagnosis present

## 2017-09-30 DIAGNOSIS — E1165 Type 2 diabetes mellitus with hyperglycemia: Secondary | ICD-10-CM | POA: Diagnosis present

## 2017-09-30 DIAGNOSIS — E119 Type 2 diabetes mellitus without complications: Secondary | ICD-10-CM | POA: Diagnosis present

## 2017-09-30 DIAGNOSIS — IMO0002 Reserved for concepts with insufficient information to code with codable children: Secondary | ICD-10-CM | POA: Diagnosis present

## 2017-09-30 DIAGNOSIS — I2510 Atherosclerotic heart disease of native coronary artery without angina pectoris: Secondary | ICD-10-CM | POA: Diagnosis present

## 2017-09-30 DIAGNOSIS — Z87891 Personal history of nicotine dependence: Secondary | ICD-10-CM | POA: Diagnosis not present

## 2017-09-30 DIAGNOSIS — H409 Unspecified glaucoma: Secondary | ICD-10-CM | POA: Diagnosis present

## 2017-09-30 DIAGNOSIS — J449 Chronic obstructive pulmonary disease, unspecified: Secondary | ICD-10-CM | POA: Diagnosis present

## 2017-09-30 DIAGNOSIS — I10 Essential (primary) hypertension: Secondary | ICD-10-CM

## 2017-09-30 DIAGNOSIS — R9431 Abnormal electrocardiogram [ECG] [EKG]: Secondary | ICD-10-CM | POA: Diagnosis not present

## 2017-09-30 HISTORY — DX: Unspecified diastolic (congestive) heart failure: I50.30

## 2017-09-30 HISTORY — DX: Anemia, unspecified: D64.9

## 2017-09-30 HISTORY — DX: Dyspnea, unspecified: R06.00

## 2017-09-30 LAB — TROPONIN I: Troponin I: 0.03 ng/mL (ref <0.03)

## 2017-09-30 LAB — BASIC METABOLIC PANEL
Anion gap: 8 (ref 5–15)
BUN: 23 mg/dL — ABNORMAL HIGH (ref 6–20)
CO2: 19 mmol/L — ABNORMAL LOW (ref 22–32)
Calcium: 9 mg/dL (ref 8.9–10.3)
Chloride: 112 mmol/L — ABNORMAL HIGH (ref 101–111)
Creatinine, Ser: 1.37 mg/dL — ABNORMAL HIGH (ref 0.44–1.00)
GFR calc Af Amer: 41 mL/min — ABNORMAL LOW (ref >60)
GFR calc non Af Amer: 35 mL/min — ABNORMAL LOW (ref >60)
Glucose, Bld: 144 mg/dL — ABNORMAL HIGH (ref 65–99)
Potassium: 4 mmol/L (ref 3.5–5.1)
Sodium: 139 mmol/L (ref 135–145)

## 2017-09-30 LAB — CBC
HCT: 30.5 % — ABNORMAL LOW (ref 36.0–46.0)
Hemoglobin: 9.6 g/dL — ABNORMAL LOW (ref 12.0–15.0)
MCH: 26.7 pg (ref 26.0–34.0)
MCHC: 31.5 g/dL (ref 30.0–36.0)
MCV: 84.7 fL (ref 78.0–100.0)
Platelets: 397 10*3/uL (ref 150–400)
RBC: 3.6 MIL/uL — ABNORMAL LOW (ref 3.87–5.11)
RDW: 16 % — ABNORMAL HIGH (ref 11.5–15.5)
WBC: 8.6 10*3/uL (ref 4.0–10.5)

## 2017-09-30 LAB — BRAIN NATRIURETIC PEPTIDE: B Natriuretic Peptide: 2068 pg/mL — ABNORMAL HIGH (ref 0.0–100.0)

## 2017-09-30 LAB — I-STAT TROPONIN, ED: Troponin i, poc: 0.04 ng/mL (ref 0.00–0.08)

## 2017-09-30 LAB — GLUCOSE, CAPILLARY: Glucose-Capillary: 195 mg/dL — ABNORMAL HIGH (ref 65–99)

## 2017-09-30 MED ORDER — ENOXAPARIN SODIUM 30 MG/0.3ML ~~LOC~~ SOLN
30.0000 mg | Freq: Every day | SUBCUTANEOUS | Status: DC
  Administered 2017-09-30 – 2017-10-02 (×3): 30 mg via SUBCUTANEOUS
  Filled 2017-09-30 (×3): qty 0.3

## 2017-09-30 MED ORDER — HYDRALAZINE HCL 20 MG/ML IJ SOLN
5.0000 mg | Freq: Once | INTRAMUSCULAR | Status: AC
  Administered 2017-09-30: 5 mg via INTRAVENOUS
  Filled 2017-09-30: qty 1

## 2017-09-30 MED ORDER — SODIUM CHLORIDE 0.9% FLUSH
3.0000 mL | Freq: Two times a day (BID) | INTRAVENOUS | Status: DC
  Administered 2017-10-01 – 2017-10-03 (×4): 3 mL via INTRAVENOUS

## 2017-09-30 MED ORDER — INSULIN ASPART 100 UNIT/ML ~~LOC~~ SOLN
0.0000 [IU] | Freq: Three times a day (TID) | SUBCUTANEOUS | Status: DC
  Administered 2017-10-02: 3 [IU] via SUBCUTANEOUS
  Administered 2017-10-02: 2 [IU] via SUBCUTANEOUS

## 2017-09-30 MED ORDER — ACETAMINOPHEN 325 MG PO TABS: 650.0000 mg | ORAL_TABLET | ORAL | Status: DC | PRN

## 2017-09-30 MED ORDER — CLONIDINE HCL 0.2 MG/24HR TD PTWK
0.2000 mg | MEDICATED_PATCH | TRANSDERMAL | Status: DC
  Administered 2017-09-30: 0.2 mg via TRANSDERMAL
  Filled 2017-09-30: qty 1

## 2017-09-30 MED ORDER — FUROSEMIDE 10 MG/ML IJ SOLN
40.0000 mg | Freq: Two times a day (BID) | INTRAMUSCULAR | Status: DC
  Administered 2017-10-01 – 2017-10-02 (×3): 40 mg via INTRAVENOUS
  Filled 2017-09-30 (×3): qty 4

## 2017-09-30 MED ORDER — INSULIN ASPART 100 UNIT/ML ~~LOC~~ SOLN: 0.0000 [IU] | Freq: Every day | SUBCUTANEOUS | Status: DC

## 2017-09-30 MED ORDER — SODIUM CHLORIDE 0.9% FLUSH: 3.0000 mL | INTRAVENOUS | Status: DC | PRN

## 2017-09-30 MED ORDER — HYDRALAZINE HCL 50 MG PO TABS
100.0000 mg | ORAL_TABLET | Freq: Three times a day (TID) | ORAL | Status: DC
  Administered 2017-09-30 – 2017-10-03 (×8): 100 mg via ORAL
  Filled 2017-09-30 (×9): qty 2

## 2017-09-30 MED ORDER — AMLODIPINE BESYLATE 10 MG PO TABS
10.0000 mg | ORAL_TABLET | Freq: Every day | ORAL | Status: DC
  Administered 2017-10-01 – 2017-10-03 (×3): 10 mg via ORAL
  Filled 2017-09-30 (×3): qty 1

## 2017-09-30 MED ORDER — SODIUM CHLORIDE 0.9 % IV SOLN: 250.0000 mL | INTRAVENOUS | Status: DC | PRN

## 2017-09-30 MED ORDER — ALPRAZOLAM 0.25 MG PO TABS
0.2500 mg | ORAL_TABLET | Freq: Two times a day (BID) | ORAL | Status: DC | PRN
  Administered 2017-10-03: 0.25 mg via ORAL
  Filled 2017-09-30: qty 1

## 2017-09-30 MED ORDER — ONDANSETRON HCL 4 MG/2ML IJ SOLN: 4.0000 mg | Freq: Four times a day (QID) | INTRAMUSCULAR | Status: DC | PRN

## 2017-09-30 MED ORDER — ASPIRIN EC 81 MG PO TBEC
81.0000 mg | DELAYED_RELEASE_TABLET | Freq: Every day | ORAL | Status: DC
  Administered 2017-10-01 – 2017-10-03 (×3): 81 mg via ORAL
  Filled 2017-09-30 (×3): qty 1

## 2017-09-30 MED ORDER — FUROSEMIDE 10 MG/ML IJ SOLN
40.0000 mg | Freq: Once | INTRAMUSCULAR | Status: AC
  Administered 2017-09-30: 40 mg via INTRAVENOUS
  Filled 2017-09-30: qty 4

## 2017-09-30 MED ORDER — NEBIVOLOL HCL 5 MG PO TABS: 5.0000 mg | ORAL_TABLET | Freq: Every day | ORAL | Status: DC

## 2017-09-30 NOTE — ED Notes (Signed)
Pt ambulated to the bathroom with steady gait.

## 2017-09-30 NOTE — ED Triage Notes (Signed)
Pt arrives to ED for sob and increased swelling in bilateral lower legs. Pt had xray done a [facility] that showed vascular congestion and mild pulmonary edema.

## 2017-09-30 NOTE — ED Provider Notes (Signed)
Emergency Department Provider Note   I have reviewed the triage vital signs and the nursing notes.   HISTORY  Chief Complaint Shortness of Breath   HPI Krista Rosales is a 83 y.o. female medical history diabetes, anemia, hypertension and heart failure who presents to the emergency departmenttoday for worsening shortness of breath. Patient states over the last 1-2 weeks she has had worsening shortness of breath especially with lying flat and walking. She also has had worsening lower extremity edema.her doctor increased her lisinopril HCTZ medication recently but she has not seen any recent results from that. She's also had a clonidine patch on. She's not had any significant chest pain, back pain, abdominal pain. Urinating normally. No altered mental status. She states her blood pressure has been high during this time as well. No other associated or modifying symptoms.   Past Medical History:  Diagnosis Date  . ANEMIA, IRON DEFICIENCY 05/07/2009   Qualifier: Diagnosis of  By: Loanne Drilling MD, Jacelyn Pi   . ARTHRITIS 05/30/2008   Qualifier: Diagnosis of  By: Marland Mcalpine    . DIABETES MELLITUS, TYPE II 07/16/2007   Qualifier: Diagnosis of  By: Marca Ancona RMA, Lucy    . DIVERTICULOSIS, COLON 05/30/2008   Qualifier: Diagnosis of  By: Marland Mcalpine    . Glaucoma   . HYPERLIPIDEMIA 07/16/2007   Qualifier: Diagnosis of  By: Reatha Armour, Lucy    . Hypertension     Patient Active Problem List   Diagnosis Date Noted  . Diabetes type 2, uncontrolled ([place])   . Headache   . Hypertensive urgency   . Hypokalemia   . Bradycardia   . Normocytic anemia   . Chronic diastolic CHF (congestive heart failure) ([place])   . Other emphysema ([place])   . Glaucoma   . Hypertension 02/20/2015  . Malignant hypertension 02/20/2015  . ANEMIA, IRON DEFICIENCY 05/07/2009  . DYSPNEA 05/07/2009  . CONSTIPATION 06/01/2008  . HEMORRHOIDS 05/30/2008  . DIVERTICULOSIS, COLON 05/30/2008  . ARTHRITIS 05/30/2008  .  COUGH 02/03/2008  . Diabetes mellitus type 2 with complications ([place]) 73/71/0626  . HYPERLIPIDEMIA 07/16/2007  . HYPERTENSION 07/16/2007  . Coronary atherosclerosis 07/16/2007  . ALLERGIC RHINITIS 07/16/2007  . OSTEOPOROSIS 07/16/2007    Past Surgical History:  Procedure Laterality Date  . ABDOMINAL HYSTERECTOMY      Current Outpatient Rx  . Order #: [id number] Class: Historical Med  . Order #: [id number] Class: Historical Med  . Order #: [id number] Class: Historical Med  . Order #: [id number] Class: Historical Med  . Order #: [id number] Class: Print  . Order #: [id number] Class: Historical Med  . Order #: [id number] Class: Historical Med  . Order #: [id number] Class: Print  . Order #: [id number] Class: Historical Med  . Order #: [id number] Class: Historical Med  . Order #: [id number] Class: Historical Med  . Order #: [id number] Class: Print  . Order #: [id number] Class: Print  . Order #: [id number] Class: Historical Med    Allergies Codeine; Penicillins; and Wellbutrin [bupropion]  Family History  Problem Relation Age of Onset  . Diabetes Mellitus II Unknown   . Pancreatic cancer Mother   . Heart disease Father   . CAD Brother     Social History Social History  Substance Use Topics  . Smoking status: Former Research scientist (life sciences)  . Smokeless tobacco: Never Used  . Alcohol use No    Review of Systems  All other systems negative except as documented in the HPI. All pertinent positives and negatives as reviewed in the HPI. ____________________________________________   PHYSICAL EXAM:  VITAL SIGNS: ED Triage Vitals  Enc Vitals Group     BP 09/30/17 1415 (!) 185/74     Pulse Rate 09/30/17 1415 79     Resp 09/30/17 1415 16     Temp 09/30/17 1415 98 F (36.7 C)     Temp Source 09/30/17 1415 Oral     SpO2 09/30/17 1415 97 %     Weight 09/30/17 1717 168 lb (76.2 kg)     Height 09/30/17 1717 5\' 7"  (1.702 m)    Constitutional: Alert and oriented. Well appearing and in no acute distress. Eyes:  Conjunctivae are normal. PERRL. EOMI. Head: Atraumatic. Nose: No congestion/rhinnorhea. Mouth/Throat: Mucous membranes are moist.  Oropharynx non-erythematous. Neck: No stridor.  No meningeal signs.   Cardiovascular: Normal rate, regular rhythm. Good peripheral circulation. Grossly normal heart sounds.   Respiratory: Normal respiratory effort.  No retractions. Lungs with mild rales. Gastrointestinal: Soft and nontender. No distention.  Musculoskeletal: No lower extremity tenderness, 1+ BLE exema. No gross deformities of extremities. Neurologic:  Normal speech and language. No gross focal neurologic deficits are appreciated.  Skin:  Skin is warm, dry and intact. No rash noted.  ____________________________________________   LABS (all labs ordered are listed, but only abnormal results are displayed)  Labs Reviewed  BASIC METABOLIC PANEL - Abnormal; Notable for the following:       Result Value   Chloride 112 (*)    CO2 19 (*)    Glucose, Bld 144 (*)    BUN 23 (*)    Creatinine, Ser 1.37 (*)    GFR calc non Af Amer 35 (*)    GFR calc Af Amer 41 (*)    All other components within normal limits  CBC - Abnormal; Notable for the following:    RBC 3.60 (*)    Hemoglobin 9.6 (*)    HCT 30.5 (*)    RDW 16.0 (*)    All other components within normal limits  BRAIN NATRIURETIC PEPTIDE - Abnormal; Notable for the following:    B Natriuretic Peptide 2,068.0 (*)    All other components within normal limits  TROPONIN I - Abnormal; Notable for the following:    Troponin I 0.03 (*)    All other components within normal limits  GLUCOSE, CAPILLARY - Abnormal; Notable for the following:    Glucose-Capillary 195 (*)    All other components within normal limits  MRSA PCR SCREENING  BASIC METABOLIC PANEL  TROPONIN I  TROPONIN I  CBC WITH DIFFERENTIAL/PLATELET  BASIC METABOLIC PANEL  I-STAT TROPONIN, ED   ____________________________________________  EKG   EKG  Interpretation  Date/Time:  Wednesday September 30 2017 14:19:15 EDT Ventricular Rate:  76 PR Interval:    QRS Duration: 98 QT Interval:  402 QTC Calculation: 452 R Axis:   -62 Text Interpretation:  Accelerated Junctional rhythm Incomplete right bundle branch block Left anterior fascicular block Possible Anterior infarct , age undetermined ST & T wave abnormality, consider inferior ischemia Abnormal ECG Confirmed by Merrily Pew [phone]) on 09/30/2017 5:18:56 PM       ____________________________________________  RADIOLOGY  No results found.  ____________________________________________   PROCEDURES  Procedure(s) performed:   Procedures  CRITICAL CARE Performed by: Merrily Pew Total critical care time: 35 minutes Critical care time was exclusive of separately billable procedures and treating other patients. Critical care was necessary to treat or prevent imminent or life-threatening deterioration. Critical care was time spent personally by me on the following activities: development of treatment plan with  patient and/or surrogate as well as nursing, discussions with consultants, evaluation of patient's response to treatment, examination of patient, obtaining history from patient or surrogate, ordering and performing treatments and interventions, ordering and review of laboratory studies, ordering and review of radiographic studies, pulse oximetry and re-evaluation of patient's condition.  ____________________________________________   INITIAL IMPRESSION / ASSESSMENT AND PLAN / ED COURSE  Pertinent labs & imaging results that were available during my care of the patient were reviewed by me and considered in my medical decision making (see chart for details).  Likely acute on chronic CHF exacerbation without evidence of hypoxia. Will workup appropriately.  Also with low heart rate. EKG was some ST depressions also presents severely hypertensive. This relation to her obvious  heart failure is concerning for hypertensive crisis. Suggested aggressive control of her blood pressure and diuresis and hospital admission however patient is reluctant to be admitted this time and would prefer to try to improve symptoms in the emergency room.  Review of labs shows elevated BNP and improving effusion and pulmonary edema on her chest x-ray. She has a slight anemia but is at her baseline. Also a bit of it a elevated troponin however feel it is related to the heart failure. We'll attempt diuresis and improved blood pressure control in the emergency department in light of her wishes to go home.  Patient significantly tachypneic and hypoxic to 86% or lower on ambulation. Given these findings I re-engaged her on my concerns and my suggestion for admission and she is agreeable to this time. We'll contact triad for admission.  ____________________________________________  FINAL CLINICAL IMPRESSION(S) / ED DIAGNOSES  Final diagnoses:  SOB (shortness of breath)  Congestive heart failure, unspecified HF chronicity, unspecified heart failure type ([place])  Hypertension, unspecified type  Hypertensive crisis     MEDICATIONS GIVEN DURING THIS VISIT:  Medications  ALPRAZolam (XANAX) tablet 0.25 mg (not administered)  amLODipine (NORVASC) tablet 10 mg (not administered)  aspirin EC tablet 81 mg (not administered)  nebivolol (BYSTOLIC) tablet 5 mg (not administered)  hydrALAZINE (APRESOLINE) tablet 100 mg (not administered)  sodium chloride flush (NS) 0.9 % injection 3 mL (not administered)  sodium chloride flush (NS) 0.9 % injection 3 mL (not administered)  0.9 %  sodium chloride infusion (not administered)  acetaminophen (TYLENOL) tablet 650 mg (not administered)  ondansetron (ZOFRAN) injection 4 mg (not administered)  enoxaparin (LOVENOX) injection 30 mg (not administered)  furosemide (LASIX) injection 40 mg (not administered)  insulin aspart (novoLOG) injection 0-9 Units (not  administered)  insulin aspart (novoLOG) injection 0-5 Units (not administered)  cloNIDine (CATAPRES - Dosed in mg/24 hr) patch 0.2 mg (not administered)  furosemide (LASIX) injection 40 mg (40 mg Intravenous Given 09/30/17 1907)  hydrALAZINE (APRESOLINE) injection 5 mg (5 mg Intravenous Given 09/30/17 1905)   Note:  This document was prepared using Dragon voice recognition software and may include unintentional dictation errors.   Merrily Pew, MD 09/30/17 2308

## 2017-09-30 NOTE — ED Notes (Signed)
Pt ambulated down hallway with a steady gait. Pt's Sp02 maintained at a 86% on room air while ambulating. Once In the room and in the bed pt's sp02 climbed back to 95% on room air. MD aware.

## 2017-09-30 NOTE — ED Notes (Signed)
Patient transported to X-ray 

## 2017-09-30 NOTE — H&P (Addendum)
History and Physical    Krista Rosales JHE:[id number] DOB: 1934-11-21 DOA: 09/30/2017  Referring MD/NP/PA: Dr. Dorise Bullion PCP: System, Pcp Not In  Patient coming from: Home  Chief Complaint: Shortness of breath  HPI: Krista Rosales is a 83 y.o. female with medical history significant of diastolic CHF last EF 18-56% with grade 2 dFx, HTN, HLD, CAD, COPD, anemia, T2DM; who presents with complaints of progressively worsening shortness of breath over the last 1 week. Shortness of breath symptoms worsen with any exertion or on trying to lay flat at night. Patient reports associated symptoms of lower extremity swelling, orthopnea, and proximal nocturnal dyspnea. She was seen by her PCP last week and due to her symptoms was increased on her Lisinopril-hydrochlorothiazide 20-12.5 mg 1 tablet po daily to 20-25 mg 1.5 tablets twice daily. Patient denies any significant improvement in symptoms. She called back up at her doctor's office today and x-ray imaging showed vascular congestion and mild pulmonary edema which the patient came to the emergency department. She denies having any significant chest pain, palpitations, dysuria, nausea, vomiting, abdominal pain, fever, chills, lightheadedness, or diarrhea. Patient was previously followed by Dr. Stanford Breed of cardiology, but review of records shows the patient had not been seen since 12/2016. During that appointment patient was having precordial chest pain and was supposed to be set up for a myocardial perfusion study, but patient had 3 subsequent no-shows for appointments.  ED Course: Upon admission into the emergency department patient was seen to be afebrile, heart rate is 2979, respirations 14-16, blood pressure is elevated up to 210/53, and O2 saturations maintained at rest. With ambulation O2 saturations noted to drop down to 88%. Labs revealed WBC 8.6, hemoglobin 9.6, sodium 139, potassium 9, chloride 112, CO2 19, BUN 23, creatinine 1.37, BNP 2068,  troponin 0.04. Patient was given 5 mg of hydralazine along with 40 mg of Lasix in the emergency department. Patient initially wanted to leave right after drop in O2 saturations with ambulation was convinced to stay.  Review of Systems  Constitutional: Negative for chills, fever and malaise/fatigue.  HENT: Negative for ear pain and tinnitus.   Eyes: Negative for double vision and photophobia.  Respiratory: Positive for cough, shortness of breath and wheezing. Negative for sputum production.   Cardiovascular: Positive for orthopnea and leg swelling. Negative for chest pain.  Gastrointestinal: Negative for abdominal pain, nausea and vomiting.  Genitourinary: Negative for frequency and urgency.  Musculoskeletal: Negative for back pain and neck pain.  Skin: Negative for itching and rash.  Neurological: Negative for sensory change, speech change and headaches.  Psychiatric/Behavioral: Negative for hallucinations and substance abuse.    Past Medical History:  Diagnosis Date  . ANEMIA, IRON DEFICIENCY 05/07/2009   Qualifier: Diagnosis of  By: Loanne Drilling MD, Jacelyn Pi   . ARTHRITIS 05/30/2008   Qualifier: Diagnosis of  By: Marland Mcalpine    . DIABETES MELLITUS, TYPE II 07/16/2007   Qualifier: Diagnosis of  By: Marca Ancona RMA, Lucy    . DIVERTICULOSIS, COLON 05/30/2008   Qualifier: Diagnosis of  By: Marland Mcalpine    . Glaucoma   . HYPERLIPIDEMIA 07/16/2007   Qualifier: Diagnosis of  By: Reatha Armour, Lucy    . Hypertension     Past Surgical History:  Procedure Laterality Date  . ABDOMINAL HYSTERECTOMY       reports that she has quit smoking. She has never used smokeless tobacco. She reports that she does not drink alcohol or use drugs.  Allergies  Allergen Reactions  . Codeine   . Penicillins   . Wellbutrin [Bupropion]     Family History  Problem Relation Age of Onset  . Diabetes Mellitus II Unknown   . Pancreatic cancer Mother   . Heart disease Father   . CAD Brother     Prior to  Admission medications   Medication Sig Start Date End Date Taking? Authorizing Provider  acetaminophen (TYLENOL) 500 MG tablet Take 500 mg by mouth every 6 (six) hours as needed.   Yes [provider]  ALPRAZolam (XANAX) 0.25 MG tablet Take 0.25 mg by mouth daily as needed. 09/07/17  Yes [provider]  amLODipine (NORVASC) 10 MG tablet Take 10 mg by mouth daily.   Yes [provider]  aspirin 81 MG tablet Take 81 mg by mouth daily.   Yes [provider]  cholecalciferol (VITAMIN D) 1000 UNITS tablet Take 1,000 Units by mouth 2 (two) times daily.   Yes [provider]  cloNIDine (CATAPRES) 0.2 MG tablet Take 0.2 mg by mouth 3 (three) times daily.    Yes [provider]  hydrALAZINE (APRESOLINE) 100 MG tablet Take 1 tablet (100 mg total) by mouth every 6 (six) hours. Patient taking differently: Take 100 mg by mouth 3 (three) times daily.  02/23/15  Yes Elgergawy, Silver Huguenin, MD  lisinopril-hydrochlorothiazide (PRINZIDE,ZESTORETIC) 20-25 MG tablet Take by mouth See admin instructions. Take 1 1/2 tablet twice a day   Yes [provider]  metFORMIN (GLUCOPHAGE) 1000 MG tablet Take 1,000 mg by mouth 2 (two) times daily with a meal.   Yes [provider]  nebivolol (BYSTOLIC) 5 MG tablet Take 1 tablet (5 mg total) by mouth daily. 02/23/15  Yes Elgergawy, Silver Huguenin, MD  spironolactone (ALDACTONE) 25 MG tablet Take 25 mg by mouth daily.   Yes [provider]  vitamin B-12 (CYANOCOBALAMIN) 500 MCG tablet Take 500 mcg by mouth daily.   Yes [provider]    Physical Exam:  Constitutional: Elderly female in no acute distress Vitals:   09/30/17 1415 09/30/17 1645 09/30/17 1717 09/30/17 1910  BP: (!) 185/74 (!) 210/53  (!) 209/88  Pulse: 79 (!) 49  67  Resp: 16 14  16   Temp: 98 F (36.7 C)     TempSrc: Oral     SpO2: 97% 96%  95%  Weight:   76.2 kg (168 lb)   Height:   5\' 7"  (1.702 m)    Eyes: PERRL, lids and  conjunctivae normal ENMT: Mucous membranes are . Posterior pharynx clear of any exudate or lesions. Neck: normal, supple, no masses, no thyromegaly Respiratory: Normal respiratory effort with positive crackles appreciated in the mid to lower lung fields. Decreased aeration noted on the left lower lung. Cardiovascular: Regular rate and rhythm, no murmurs / rubs / gallops. +2 pitting lower extremity edema. 2+ pedal pulses. No carotid bruits.  Abdomen: no tenderness, no masses palpated. No hepatosplenomegaly. Bowel sounds positive.  Musculoskeletal: no clubbing / cyanosis. No joint deformity upper and lower extremities. Good ROM, no contractures. Normal muscle tone.  Skin: no rashes, lesions, ulcers. No induration Neurologic: CN 2-12 grossly intact. Sensation intact, DTR normal. Strength 5/5 in all 4.  Psychiatric: Normal judgment and insight. Alert and oriented x 3. Normal mood.     Labs on Admission: I have personally reviewed following labs and imaging studies  CBC:  Recent Labs Lab 09/30/17 1417  WBC 8.6  HGB 9.6*  HCT 30.5*  MCV 84.7  PLT 027   Basic Metabolic Panel:  Recent Labs Lab 09/30/17 1417  NA 139  K 4.0  CL 112*  CO2 19*  GLUCOSE 144*  BUN 23*  CREATININE 1.37*  CALCIUM 9.0   GFR: Estimated Creatinine Clearance: 34.3 mL/min (A) (by C-G formula based on SCr of 1.37 mg/dL (H)). Liver Function Tests: No results for input(s): AST, ALT, ALKPHOS, BILITOT, PROT, ALBUMIN in the last 168 hours. No results for input(s): LIPASE, AMYLASE in the last 168 hours. No results for input(s): AMMONIA in the last 168 hours. Coagulation Profile: No results for input(s): INR, PROTIME in the last 168 hours. Cardiac Enzymes: No results for input(s): CKTOTAL, CKMB, CKMBINDEX, TROPONINI in the last 168 hours. BNP (last 3 results) No results for input(s): PROBNP in the last 8760 hours. HbA1C: No results for input(s): HGBA1C in the last 72 hours. CBG: No results for input(s):  GLUCAP in the last 168 hours. Lipid Profile: No results for input(s): CHOL, HDL, LDLCALC, TRIG, CHOLHDL, LDLDIRECT in the last 72 hours. Thyroid Function Tests: No results for input(s): TSH, T4TOTAL, FREET4, T3FREE, THYROIDAB in the last 72 hours. Anemia Panel: No results for input(s): VITAMINB12, FOLATE, FERRITIN, TIBC, IRON, RETICCTPCT in the last 72 hours. Urine analysis:    Component Value Date/Time   COLORURINE YELLOW 02/20/2015 2211   APPEARANCEUR CLEAR 02/20/2015 2211   LABSPEC 1.017 02/20/2015 2211   PHURINE 6.0 02/20/2015 2211   GLUCOSEU 100 (A) 02/20/2015 2211   GLUCOSEU >=1000 10/12/2009 0935   HGBUR NEGATIVE 02/20/2015 2211   BILIRUBINUR NEGATIVE 02/20/2015 2211   KETONESUR NEGATIVE 02/20/2015 2211   PROTEINUR >300 (A) 02/20/2015 2211   UROBILINOGEN 0.2 02/20/2015 2211   NITRITE NEGATIVE 02/20/2015 2211   LEUKOCYTESUR NEGATIVE 02/20/2015 2211   Sepsis Labs: No results found for this or any previous visit (from the past 240 hour(s)).   Radiological Exams on Admission: Dg Chest 2 View  Result Date: 09/30/2017 CLINICAL DATA:  84 year old female with a history of edema EXAM: CHEST  2 VIEW COMPARISON:  09/23/2017, 06/19/2015 FINDINGS: Cardiomediastinal silhouette unchanged in size and contour with cardiomegaly. Bilateral interlobular septal thickening with fullness in the central vasculature. Opacity at the left lung base persists, though improved. No confluent airspace disease.  No displaced fracture. IMPRESSION: Improving edema and left pleural effusion. Electronically Signed   By: Corrie Mckusick D.O.   On: 09/30/2017 18:32    EKG: Independently reviewed. Sinus rhythm with first-degree heart block and nonspecific ST-T wave changes similar to previous tracing.   Assessment/Plan Diastolic congestive heart failure exacerbation with hypoxia: Acute on chronic. Patient presents complaining of shortness of breath and lower extremity swelling. BNP found to be elevated at 2068  along with chest x-ray showing cardiomegaly with pulmonary edema and left pleural effusion improving.  Last EF noted to be 55-60% with grade 2 diastolic dysfunction in 2536. - Admit to a telemetry bed - Heart failure orders set  initiated  - Continuous pulse oximetry with nasal cannula oxygen as needed to keep O2 saturations >92% - Strict I&Os and daily weights - Elevate lower extremities - Lasix 40 mg IV Bid - Reassess in a.m. and adjust diuresis as needed. - Check echocardiogram - Optimize medical management as able - Message sent for consultation to cardiology in a.m.   Hypertensive urgency: Acute.  initial blood pressure is elevated up to 210/53. - Continue amlodipine, Biastolic, clonidine, hydralazine - Hydralazine IV prn   Renal insufficiency: Baseline creatinine previously 1.2, patient presents with a creatinine of 1.37  and BUN 23. Given CHF suspect symptoms likely to be secondary to hypoperfusion and will increase with diuresis. - Recheckbmp  in a.m.  Coronary artery disease - Continue aspirin   Normocytic normochromic anemia:Chronic. Patient presents with a hemoglobin of 9.6 which upon review of records appears near patient's baseline. - Recheck CBC in a.m.  Diabetes mellitus type 2: Patient only on metformin at home. Last hemoglobin A1c noted to be 7.7 and 02/2015. Blood glucose on admission 144. - Hypoglycemic protocols - Hold metformin  - CBGs every before meals and at bedtime with sensitive SSI  DVT prophylaxis: Lovenox  Code Status: Full  Family Communication: No family present at bedside  Disposition Plan: Likely discharge home once medically stable  Consults called: none  Admission status: Observation  Norval Morton MD [facility] Pager [phone]   If 7PM-7AM, please contact night-coverage [email or web address] Password [email or web address]  09/30/2017, 7:51 PM

## 2017-10-01 ENCOUNTER — Other Ambulatory Visit (HOSPITAL_COMMUNITY): Payer: Medicare HMO

## 2017-10-01 ENCOUNTER — Observation Stay (HOSPITAL_BASED_OUTPATIENT_CLINIC_OR_DEPARTMENT_OTHER): Payer: Medicare HMO

## 2017-10-01 ENCOUNTER — Encounter (HOSPITAL_COMMUNITY): Payer: Self-pay | Admitting: *Deleted

## 2017-10-01 DIAGNOSIS — I160 Hypertensive urgency: Secondary | ICD-10-CM | POA: Diagnosis present

## 2017-10-01 DIAGNOSIS — I110 Hypertensive heart disease with heart failure: Secondary | ICD-10-CM | POA: Diagnosis present

## 2017-10-01 DIAGNOSIS — I471 Supraventricular tachycardia: Secondary | ICD-10-CM | POA: Diagnosis not present

## 2017-10-01 DIAGNOSIS — I2510 Atherosclerotic heart disease of native coronary artery without angina pectoris: Secondary | ICD-10-CM | POA: Diagnosis present

## 2017-10-01 DIAGNOSIS — Z79899 Other long term (current) drug therapy: Secondary | ICD-10-CM | POA: Diagnosis not present

## 2017-10-01 DIAGNOSIS — N179 Acute kidney failure, unspecified: Secondary | ICD-10-CM | POA: Diagnosis present

## 2017-10-01 DIAGNOSIS — I5033 Acute on chronic diastolic (congestive) heart failure: Secondary | ICD-10-CM | POA: Diagnosis present

## 2017-10-01 DIAGNOSIS — J449 Chronic obstructive pulmonary disease, unspecified: Secondary | ICD-10-CM | POA: Diagnosis present

## 2017-10-01 DIAGNOSIS — R001 Bradycardia, unspecified: Secondary | ICD-10-CM | POA: Diagnosis present

## 2017-10-01 DIAGNOSIS — R0602 Shortness of breath: Secondary | ICD-10-CM | POA: Diagnosis present

## 2017-10-01 DIAGNOSIS — I340 Nonrheumatic mitral (valve) insufficiency: Secondary | ICD-10-CM | POA: Diagnosis not present

## 2017-10-01 DIAGNOSIS — I5031 Acute diastolic (congestive) heart failure: Secondary | ICD-10-CM | POA: Diagnosis not present

## 2017-10-01 DIAGNOSIS — E785 Hyperlipidemia, unspecified: Secondary | ICD-10-CM | POA: Diagnosis present

## 2017-10-01 DIAGNOSIS — D649 Anemia, unspecified: Secondary | ICD-10-CM | POA: Diagnosis present

## 2017-10-01 DIAGNOSIS — H409 Unspecified glaucoma: Secondary | ICD-10-CM | POA: Diagnosis present

## 2017-10-01 DIAGNOSIS — Z7982 Long term (current) use of aspirin: Secondary | ICD-10-CM | POA: Diagnosis not present

## 2017-10-01 DIAGNOSIS — Z87891 Personal history of nicotine dependence: Secondary | ICD-10-CM | POA: Diagnosis not present

## 2017-10-01 DIAGNOSIS — E119 Type 2 diabetes mellitus without complications: Secondary | ICD-10-CM | POA: Diagnosis present

## 2017-10-01 DIAGNOSIS — Z7984 Long term (current) use of oral hypoglycemic drugs: Secondary | ICD-10-CM | POA: Diagnosis not present

## 2017-10-01 DIAGNOSIS — I509 Heart failure, unspecified: Secondary | ICD-10-CM | POA: Diagnosis not present

## 2017-10-01 LAB — GLUCOSE, CAPILLARY
Glucose-Capillary: 108 mg/dL — ABNORMAL HIGH (ref 65–99)
Glucose-Capillary: 133 mg/dL — ABNORMAL HIGH (ref 65–99)
Glucose-Capillary: 115 mg/dL — ABNORMAL HIGH (ref 65–99)
Glucose-Capillary: 161 mg/dL — ABNORMAL HIGH (ref 65–99)

## 2017-10-01 LAB — ECHOCARDIOGRAM COMPLETE
Height: 68 in
Weight: 2424 oz

## 2017-10-01 LAB — BASIC METABOLIC PANEL
Anion gap: 9 (ref 5–15)
Anion gap: 10 (ref 5–15)
BUN: 23 mg/dL — ABNORMAL HIGH (ref 6–20)
BUN: 20 mg/dL (ref 6–20)
CO2: 19 mmol/L — ABNORMAL LOW (ref 22–32)
CO2: 19 mmol/L — ABNORMAL LOW (ref 22–32)
Calcium: 9 mg/dL (ref 8.9–10.3)
Calcium: 8.9 mg/dL (ref 8.9–10.3)
Chloride: 110 mmol/L (ref 101–111)
Chloride: 110 mmol/L (ref 101–111)
Creatinine, Ser: 1.42 mg/dL — ABNORMAL HIGH (ref 0.44–1.00)
Creatinine, Ser: 1.41 mg/dL — ABNORMAL HIGH (ref 0.44–1.00)
GFR calc Af Amer: 39 mL/min — ABNORMAL LOW (ref >60)
GFR calc Af Amer: 39 mL/min — ABNORMAL LOW (ref >60)
GFR calc non Af Amer: 34 mL/min — ABNORMAL LOW (ref >60)
GFR calc non Af Amer: 34 mL/min — ABNORMAL LOW (ref >60)
Glucose, Bld: 124 mg/dL — ABNORMAL HIGH (ref 65–99)
Glucose, Bld: 100 mg/dL — ABNORMAL HIGH (ref 65–99)
Potassium: 3.6 mmol/L (ref 3.5–5.1)
Potassium: 3.7 mmol/L (ref 3.5–5.1)
Sodium: 138 mmol/L (ref 135–145)
Sodium: 139 mmol/L (ref 135–145)

## 2017-10-01 LAB — CBC WITH DIFFERENTIAL/PLATELET
Basophils Absolute: 0 10*3/uL (ref 0.0–0.1)
Basophils Relative: 0 %
Eosinophils Absolute: 0.1 10*3/uL (ref 0.0–0.7)
Eosinophils Relative: 1 %
HCT: 30 % — ABNORMAL LOW (ref 36.0–46.0)
Hemoglobin: 9.5 g/dL — ABNORMAL LOW (ref 12.0–15.0)
Lymphocytes Relative: 14 %
Lymphs Abs: 1 10*3/uL (ref 0.7–4.0)
MCH: 26.3 pg (ref 26.0–34.0)
MCHC: 31.7 g/dL (ref 30.0–36.0)
MCV: 83.1 fL (ref 78.0–100.0)
Monocytes Absolute: 0.8 10*3/uL (ref 0.1–1.0)
Monocytes Relative: 11 %
Neutro Abs: 5.5 10*3/uL (ref 1.7–7.7)
Neutrophils Relative %: 74 %
Platelets: 357 10*3/uL (ref 150–400)
RBC: 3.61 MIL/uL — ABNORMAL LOW (ref 3.87–5.11)
RDW: 16.3 % — ABNORMAL HIGH (ref 11.5–15.5)
WBC: 7.4 10*3/uL (ref 4.0–10.5)

## 2017-10-01 LAB — TROPONIN I
Troponin I: 0.04 ng/mL (ref <0.03)
Troponin I: 0.04 ng/mL (ref <0.03)

## 2017-10-01 LAB — MRSA PCR SCREENING: MRSA by PCR: NEGATIVE

## 2017-10-01 LAB — MAGNESIUM: Magnesium: 1.7 mg/dL (ref 1.7–2.4)

## 2017-10-01 MED ORDER — MAGNESIUM SULFATE 2 GM/50ML IV SOLN
2.0000 g | Freq: Once | INTRAVENOUS | Status: AC
  Administered 2017-10-01: 2 g via INTRAVENOUS
  Filled 2017-10-01: qty 50

## 2017-10-01 MED ORDER — POLYETHYLENE GLYCOL 3350 17 G PO PACK
17.0000 g | PACK | Freq: Two times a day (BID) | ORAL | Status: DC
  Administered 2017-10-01 – 2017-10-03 (×2): 17 g via ORAL
  Filled 2017-10-01 (×4): qty 1

## 2017-10-01 MED ORDER — POTASSIUM CHLORIDE CRYS ER 20 MEQ PO TBCR
40.0000 meq | EXTENDED_RELEASE_TABLET | Freq: Once | ORAL | Status: AC
  Administered 2017-10-01: 40 meq via ORAL
  Filled 2017-10-01: qty 2

## 2017-10-01 NOTE — Progress Notes (Signed)
Pt's heart keeps dropping down to 30's and having multiple pauses. Longest being 3.82 secs. Pt is asymptomatic. [facility] notified.

## 2017-10-01 NOTE — Progress Notes (Signed)
CRITICAL VALUE ALERT  Critical Value:  Troponin   Date & Time Notied: 10/01/2017 0000  Provider Notified: [facility]  Orders Received/Actions taken:

## 2017-10-01 NOTE — Progress Notes (Signed)
PROGRESS NOTE    Krista Rosales  GYJ:[id number] DOB: 14-Jan-1934 DOA: 09/30/2017 PCP: System, Pcp Not In   Brief Narrative:  Krista Rosales is a 83 y.o. female with medical history significant of diastolic CHF last EF 26-37% with grade 2 dFx, HTN, HLD, CAD, COPD, anemia, T2DM; who presents with complaints of progressively worsening shortness of breath over the last 1 week. Shortness of breath symptoms worsen with any exertion or on trying to lay flat at night. Patient reports associated symptoms of lower extremity swelling, orthopnea, and proximal nocturnal dyspnea. She was seen by her PCP last week and due to her symptoms was increased on her Lisinopril-hydrochlorothiazide 20-12.5 mg 1 tablet po daily to 20-25 mg 1.5 tablets twice daily. Patient denies any significant improvement in symptoms. She called back up at her doctor's office today and x-ray imaging showed vascular congestion and mild pulmonary edema which the patient came to the emergency department. She denies having any significant chest pain, palpitations, dysuria, nausea, vomiting, abdominal pain, fever, chills, lightheadedness, or diarrhea. Patient was previously followed by Dr. Stanford Breed of cardiology, but review of records shows the patient had not been seen since 12/2016. During that appointment patient was having precordial chest pain and was supposed to be set up for a myocardial perfusion study, but patient had 3 subsequent no-shows for appointments.  ED Course: Upon admission into the emergency department patient was seen to be afebrile, heart rate is 2979, respirations 14-16, blood pressure is elevated up to 210/53, and O2 saturations maintained at rest. With ambulation O2 saturations noted to drop down to 88%. Labs revealed WBC 8.6, hemoglobin 9.6, sodium 139, potassium 9, chloride 112, CO2 19, BUN 23, creatinine 1.37, BNP 2068, troponin 0.04. Patient was given 5 mg of hydralazine along with 40 mg of Lasix in the emergency  department. Patient initially wanted to leave right after drop in O2 saturations with ambulation was convinced to stay.  Assessment & Plan:   Principal Problem:   Diastolic congestive heart failure (HCC) Active Problems:   Diabetes type 2, uncontrolled ([place])   Hypertensive urgency   Normocytic anemia   1-Acute Diastolic HF exacerbation;  Continue with IV Lasix.  Monitor out put. Daily weight.  Urine out put 1.7 l. Weight 68 K She is feeling better.  ECHO pending.  Cardiology consulted.   2-HTN urgency ; Continue with amlodipine, clonidine, hydralazine, clonidine.   Mild elevation troponin;  Related to HF, demand ischemia.  Await ECHO to determine further work up.   DM;  Last hemoglobin A1c noted to be 7.7 and 02/2015. Blood glucose on admission 144. SSI.   AKI;  Monitor on lasix.   Sinus Bradycardia and pauses,  Hold bystolic.  Cardiology consulted.  Replete electrolytes.   Normocytic normochromic anemia:Chronic. Patient presents with a hemoglobin of 9.6 which upon review of records appears near patient's baseline. - Recheck CBC in a.m.  DVT prophylaxis: Lovenox Code Status: full code.  Family Communication: care discussed with patient.  Disposition Plan: to be determine   Consultants:   Cardiology    Procedures: ECHO pending.    Antimicrobials: none   Subjective: Breathing better. Denies chest pain   Objective: Vitals:   09/30/17 2224 09/30/17 2238 10/01/17 0441 10/01/17 0758  BP:  (!) 207/105 (!) 171/75 (!) 201/73  Pulse:  (!) 56 80 (!) 126  Resp:   (!) 23 (!) 22  Temp:  98.8 F (37.1 C) 98.8 F (37.1 C) 99.1 F (37.3 C)  TempSrc:  Oral  Oral Oral  SpO2:  93% 96% 96%  Weight: 70.2 kg (154 lb 11.2 oz)  68.7 kg (151 lb 8 oz)   Height: 5\' 8"  (1.727 m)       Intake/Output Summary (Last 24 hours) at 10/01/17 1236 Last data filed at 10/01/17 0957  Gross per 24 hour  Intake              240 ml  Output             2600 ml  Net            -2360  ml   Filed Weights   09/30/17 1717 09/30/17 2224 10/01/17 0441  Weight: 76.2 kg (168 lb) 70.2 kg (154 lb 11.2 oz) 68.7 kg (151 lb 8 oz)    Examination:  General exam: NAD Respiratory system: Clear to auscultation. Respiratory effort normal. Cardiovascular system: S1 & S2 heard, RRR. No JVD, murmurs, rubs, gallops or clicks. No pedal edema. Gastrointestinal system: Abdomen is nondistended, soft and nontender. No organomegaly or masses felt. Normal bowel sounds heard. Central nervous system: Alert and oriented. No focal neurological deficits. Extremities: Symmetric 5 x 5 power. Skin: No rashes, lesions or ulcers    Data Reviewed: I have personally reviewed following labs and imaging studies  CBC:  Recent Labs Lab 09/30/17 1417 10/01/17 0823  WBC 8.6 7.4  NEUTROABS  --  5.5  HGB 9.6* 9.5*  HCT 30.5* 30.0*  MCV 84.7 83.1  PLT 397 643   Basic Metabolic Panel:  Recent Labs Lab 09/30/17 1417 10/01/17 0208 10/01/17 0823  NA 139 138 139  K 4.0 3.6 3.7  CL 112* 110 110  CO2 19* 19* 19*  GLUCOSE 144* 124* 100*  BUN 23* 23* 20  CREATININE 1.37* 1.42* 1.41*  CALCIUM 9.0 9.0 8.9  MG  --   --  1.7   GFR: Estimated Creatinine Clearance: 31.6 mL/min (A) (by C-G formula based on SCr of 1.41 mg/dL (H)). Liver Function Tests: No results for input(s): AST, ALT, ALKPHOS, BILITOT, PROT, ALBUMIN in the last 168 hours. No results for input(s): LIPASE, AMYLASE in the last 168 hours. No results for input(s): AMMONIA in the last 168 hours. Coagulation Profile: No results for input(s): INR, PROTIME in the last 168 hours. Cardiac Enzymes:  Recent Labs Lab 09/30/17 2115 10/01/17 0208 10/01/17 0823  TROPONINI 0.03* 0.04* 0.04*   BNP (last 3 results) No results for input(s): PROBNP in the last 8760 hours. HbA1C: No results for input(s): HGBA1C in the last 72 hours. CBG:  Recent Labs Lab 09/30/17 2241 10/01/17 0827 10/01/17 1219  GLUCAP 195* 108* 133*   Lipid  Profile: No results for input(s): CHOL, HDL, LDLCALC, TRIG, CHOLHDL, LDLDIRECT in the last 72 hours. Thyroid Function Tests: No results for input(s): TSH, T4TOTAL, FREET4, T3FREE, THYROIDAB in the last 72 hours. Anemia Panel: No results for input(s): VITAMINB12, FOLATE, FERRITIN, TIBC, IRON, RETICCTPCT in the last 72 hours. Sepsis Labs: No results for input(s): PROCALCITON, LATICACIDVEN in the last 168 hours.  Recent Results (from the past 240 hour(s))  MRSA PCR Screening     Status: None   Collection Time: 09/30/17 10:56 PM  Result Value Ref Range Status   MRSA by PCR NEGATIVE NEGATIVE Final    Comment:        The GeneXpert MRSA Assay (FDA approved for NASAL specimens only), is one component of a comprehensive MRSA colonization surveillance program. It is not intended to diagnose MRSA infection nor to guide or  monitor treatment for MRSA infections.          Radiology Studies: Dg Chest 2 View  Result Date: 09/30/2017 CLINICAL DATA:  83 year old female with a history of edema EXAM: CHEST  2 VIEW COMPARISON:  09/23/2017, 06/19/2015 FINDINGS: Cardiomediastinal silhouette unchanged in size and contour with cardiomegaly. Bilateral interlobular septal thickening with fullness in the central vasculature. Opacity at the left lung base persists, though improved. No confluent airspace disease.  No displaced fracture. IMPRESSION: Improving edema and left pleural effusion. Electronically Signed   By: Corrie Mckusick D.O.   On: 09/30/2017 18:32        Scheduled Meds: . amLODipine  10 mg Oral Daily  . aspirin EC  81 mg Oral Daily  . cloNIDine  0.2 mg Transdermal Weekly  . enoxaparin (LOVENOX) injection  30 mg Subcutaneous QHS  . furosemide  40 mg Intravenous BID  . hydrALAZINE  100 mg Oral TID  . insulin aspart  0-5 Units Subcutaneous QHS  . insulin aspart  0-9 Units Subcutaneous TID WC  . sodium chloride flush  3 mL Intravenous Q12H   Continuous Infusions: . sodium chloride     . magnesium sulfate 1 - 4 g bolus IVPB       LOS: 0 days    Time spent: 35 minutes.     Elmarie Shiley, MD [facility] Pager [phone]  If 7PM-7AM, please contact night-coverage [email or web address] Password [facility] 10/01/2017, 12:36 PM

## 2017-10-01 NOTE — Consult Note (Signed)
Cardiology Consultation:   Patient ID: Krista Rosales; [id number]; Dec 03, 1934   Admit date: 09/30/2017 Date of Consult: 10/01/2017  Primary Care Provider: System, Pcp Not In Primary Cardiologist: Dr. Stanford Breed Primary Electrophysiologist:     Patient Profile:   Krista Rosales is a 83 y.o. female with a hx of chronic diastolic heart failure, mild AS, mild MR, severe LAE, DOE, HTN, HLD, DM, arthritis who is being seen today for the evaluation of acute on chronic congestive heart failure at the request of Dr. Tyrell Antonio.  History of Present Illness:   Krista Rosales is known to this service and last saw Dr. Stanford Breed in clinic on 12/26/16. At that time, she was seen for evaluation of chest pain. He ordered a lexiscan myoview, but this was never completed.   Pt presented to her PCP yesterday 09/30/17 with complaints of 4-day history of shortness of breath and lower extremity swelling. She was sent to [facility] for further evaluation. On my interview she states she has never had chest pain. She reports SOB and lower extremity swelling that are improving with the initiation of IV lasix. Telemetry shows periods of NSR in the 80s with sinus bradycardia in the 40s and sinus pauses of up to 3.48 sec. She is asymptomatic and denies lightheadedness, dizziness, and syncope. She denies recent falls and palpitations. She has not been sick recently, no N/V.    Past Medical History:  Diagnosis Date  . ANEMIA, IRON DEFICIENCY 05/07/2009   Qualifier: Diagnosis of  By: Loanne Drilling MD, Jacelyn Pi   . ARTHRITIS 05/30/2008   Qualifier: Diagnosis of  By: Marland Mcalpine    . DIABETES MELLITUS, TYPE II 07/16/2007   Qualifier: Diagnosis of  By: Marca Ancona RMA, Lucy    . DIVERTICULOSIS, COLON 05/30/2008   Qualifier: Diagnosis of  By: Marland Mcalpine    . Glaucoma   . HYPERLIPIDEMIA 07/16/2007   Qualifier: Diagnosis of  By: Reatha Armour, Lucy    . Hypertension     Past Surgical History:  Procedure Laterality Date  . ABDOMINAL  HYSTERECTOMY       Home Medications:  Prior to Admission medications   Medication Sig Start Date End Date Taking? Authorizing Provider  acetaminophen (TYLENOL) 500 MG tablet Take 500 mg by mouth every 6 (six) hours as needed.   Yes [provider]  ALPRAZolam (XANAX) 0.25 MG tablet Take 0.25 mg by mouth daily as needed. 09/07/17  Yes [provider]  amLODipine (NORVASC) 10 MG tablet Take 10 mg by mouth daily.   Yes [provider]  aspirin 81 MG tablet Take 81 mg by mouth daily.   Yes [provider]  cholecalciferol (VITAMIN D) 1000 UNITS tablet Take 1,000 Units by mouth 2 (two) times daily.   Yes [provider]  cloNIDine (CATAPRES) 0.2 MG tablet Take 0.2 mg by mouth 3 (three) times daily.    Yes [provider]  hydrALAZINE (APRESOLINE) 100 MG tablet Take 1 tablet (100 mg total) by mouth every 6 (six) hours. Patient taking differently: Take 100 mg by mouth 3 (three) times daily.  02/23/15  Yes Elgergawy, Silver Huguenin, MD  lisinopril-hydrochlorothiazide (PRINZIDE,ZESTORETIC) 20-25 MG tablet Take by mouth See admin instructions. Take 1 1/2 tablet twice a day   Yes [provider]  metFORMIN (GLUCOPHAGE) 1000 MG tablet Take 1,000 mg by mouth 2 (two) times daily with a meal.   Yes [provider]  nebivolol (BYSTOLIC) 5 MG tablet Take 1 tablet (5 mg total)  by mouth daily. 02/23/15  Yes Elgergawy, Silver Huguenin, MD  spironolactone (ALDACTONE) 25 MG tablet Take 25 mg by mouth daily.   Yes [provider]  vitamin B-12 (CYANOCOBALAMIN) 500 MCG tablet Take 500 mcg by mouth daily.   Yes [provider]    Inpatient Medications: Scheduled Meds: . amLODipine  10 mg Oral Daily  . aspirin EC  81 mg Oral Daily  . cloNIDine  0.2 mg Transdermal Weekly  . enoxaparin (LOVENOX) injection  30 mg Subcutaneous QHS  . furosemide  40 mg Intravenous BID  . hydrALAZINE  100 mg Oral TID  . insulin aspart  0-5 Units Subcutaneous QHS    . insulin aspart  0-9 Units Subcutaneous TID WC  . sodium chloride flush  3 mL Intravenous Q12H   Continuous Infusions: . sodium chloride    . magnesium sulfate 1 - 4 g bolus IVPB     PRN Meds: sodium chloride, acetaminophen, ALPRAZolam, ondansetron (ZOFRAN) IV, sodium chloride flush  Allergies:    Allergies  Allergen Reactions  . Codeine   . Penicillins   . Wellbutrin [Bupropion]     Social History:   Social History   Social History  . Marital status: Widowed    Spouse name: N/A  . Number of children: N/A  . Years of education: N/A   Occupational History  . Not on file.   Social History Main Topics  . Smoking status: Former Research scientist (life sciences)  . Smokeless tobacco: Never Used  . Alcohol use No  . Drug use: No  . Sexual activity: Not on file   Other Topics Concern  . Not on file   Social History Narrative   Widowed.  No children.  Has a brother.      Family History:    Family History  Problem Relation Age of Onset  . Diabetes Mellitus II Unknown   . Pancreatic cancer Mother   . Heart disease Father   . CAD Brother      ROS:  Please see the history of present illness.  ROS  All other ROS reviewed and negative.     Physical Exam/Data:   Vitals:   09/30/17 2224 09/30/17 2238 10/01/17 0441 10/01/17 0758  BP:  (!) 207/105 (!) 171/75 (!) 201/73  Pulse:  (!) 56 80 (!) 126  Resp:   (!) 23 (!) 22  Temp:  98.8 F (37.1 C) 98.8 F (37.1 C) 99.1 F (37.3 C)  TempSrc:  Oral Oral Oral  SpO2:  93% 96% 96%  Weight: 154 lb 11.2 oz (70.2 kg)  151 lb 8 oz (68.7 kg)   Height: 5\' 8"  (1.727 m)       Intake/Output Summary (Last 24 hours) at 10/01/17 1242 Last data filed at 10/01/17 0957  Gross per 24 hour  Intake              240 ml  Output             2600 ml  Net            -2360 ml   Filed Weights   09/30/17 1717 09/30/17 2224 10/01/17 0441  Weight: 168 lb (76.2 kg) 154 lb 11.2 oz (70.2 kg) 151 lb 8 oz (68.7 kg)   Body mass index is 23.04 kg/m.  General:  Well  nourished, well developed, in no acute distress HEENT: normal Neck: no JVD Vascular: No carotid bruits; FA pulses 2+ bilaterally without bruits  Cardiac:  normal S1, S2; RRR; systolic  murmur Lungs:  clear to auscultation bilaterally in upper lobes, crackles in bases Abd: soft, nontender, no hepatomegaly  Ext: 2+ edema Musculoskeletal:  No deformities, BUE and BLE strength normal and equal Skin: warm and dry  Neuro:  CNs 2-12 intact, no focal abnormalities noted Psych:  Normal affect   EKG:  The EKG was personally reviewed and demonstrates:  Accelerated junctional with ST depression in inferior leads and previous anterior infarct Telemetry:  Telemetry was personally reviewed and demonstrates:  Bouts of NSR, sinus brady, sinus pauses, and junctional rhythm  Relevant CV Studies:  Echocardiogram: pending  Echocardiogram 07/27/15: Study Conclusions - Left ventricle: The cavity size was normal. There was mild   concentric hypertrophy. Systolic function was normal. The   estimated ejection fraction was in the range of 55% to 60%. Wall   motion was normal; there were no regional wall motion   abnormalities. Features are consistent with a pseudonormal left   ventricular filling pattern, with concomitant abnormal relaxation   and increased filling pressure (grade 2 diastolic dysfunction). - Aortic valve: Mean gradient (S): 10 mm Hg. Peak gradient (S): 18   mm Hg. Valve area (VTI): 2.07 cm^2. Valve area (Vmax): 2.12 cm^2.   Valve area (Vmean): 2.21 cm^2. - Mitral valve: There was mild regurgitation. - Left atrium: The atrium was severely dilated. - Right ventricle: The cavity size was mildly dilated. Wall   thickness was normal. - Right atrium: The atrium was mildly dilated. - Atrial septum: The septum bowed from left to right, consistent   with increased left atrial pressure.   Laboratory Data:  Chemistry Recent Labs Lab 09/30/17 1417 10/01/17 0208 10/01/17 0823  NA 139 138 139    K 4.0 3.6 3.7  CL 112* 110 110  CO2 19* 19* 19*  GLUCOSE 144* 124* 100*  BUN 23* 23* 20  CREATININE 1.37* 1.42* 1.41*  CALCIUM 9.0 9.0 8.9  GFRNONAA 35* 34* 34*  GFRAA 41* 39* 39*  ANIONGAP 8 9 10     No results for input(s): PROT, ALBUMIN, AST, ALT, ALKPHOS, BILITOT in the last 168 hours. Hematology Recent Labs Lab 09/30/17 1417 10/01/17 0823  WBC 8.6 7.4  RBC 3.60* 3.61*  HGB 9.6* 9.5*  HCT 30.5* 30.0*  MCV 84.7 83.1  MCH 26.7 26.3  MCHC 31.5 31.7  RDW 16.0* 16.3*  PLT 397 357   Cardiac Enzymes Recent Labs Lab 09/30/17 2115 10/01/17 0208 10/01/17 0823  TROPONINI 0.03* 0.04* 0.04*    Recent Labs Lab 09/30/17 1441  TROPIPOC 0.04    BNP Recent Labs Lab 09/30/17 1417  BNP 2,068.0*    DDimer No results for input(s): DDIMER in the last 168 hours.  Radiology/Studies:  Dg Chest 2 View  Result Date: 09/30/2017 CLINICAL DATA:  83 year old female with a history of edema EXAM: CHEST  2 VIEW COMPARISON:  09/23/2017, 06/19/2015 FINDINGS: Cardiomediastinal silhouette unchanged in size and contour with cardiomegaly. Bilateral interlobular septal thickening with fullness in the central vasculature. Opacity at the left lung base persists, though improved. No confluent airspace disease.  No displaced fracture. IMPRESSION: Improving edema and left pleural effusion. Electronically Signed   By: Corrie Mckusick D.O.   On: 09/30/2017 18:32    Assessment and Plan:   1. Acute on chronic diastolic heart failure - echo in 2016 with grade 2 DD - repeat echo pending - BNP on admission was 2086 with a sCr of 1.37 - weight on admission 168 lbs, now 151 lbs - she is overall net negative  2.5L today - troponin 0.03 --> 0.04 --> 0.04 - EKG with junctional rhythm, ST depression in inferior leads and possible previous anterior infarct - changes appear new from previous tracings - this troponin elevation is low and flat, likely secondary to demand ischemia in the setting of congestive  heart failure exacerbation, but ischemia has not been ruled out - if echo is changed from previous, may consider ischemic evaluation; however, patient adamantly denies chest pain.  - agree with IV lasix - 40 mg IV lasix BID - continue ASA, clonidine patch, hydralazine, and norvasc - daily potassium supplementation as needed - additional home medications include aldactone, lisinopril-HCTZ, bystolic - anticipate discharging on aldactone and lasix in place of HCTZ   2. DM - need updated A1c - SSI - per primary team   3. Acute kidney injury - sCr on admission 1.37, now 1.41 - baseline unknown, but in 2016 was 1.10-1.20 - this may be related to her heart failure exacerbation, DM, or HTN - continue to monitor, avoid nephrotoxic agents   4. Sinus bradycardia and pauses on telemetry, bouts of accelerated junctional rhythm - sinus pauses during wake hours 2.6-3.48 sec - patient is asymptomatic - if this persists, may reconsider norvasc a/o bystolic    For questions or updates, please contact [place] Please consult [email or web address] for contact info under Cardiology/STEMI.   Signed, Ledora Bottcher, [place]  10/01/2017 12:42 PM  Attending Note:   The patient was seen and examined.  Agree with assessment and plan as noted above.  Changes made to the above note as needed.  Patient seen and independently examined with Doreene Adas, PA .   We discussed all aspects of the encounter. I agree with the assessment and plan as stated above.  1.  Chronic diastolic congestive heart failure: The patient feels much better after getting IV Lasix. She's on HCTZ and on Aldactone as an outpatient. I think that she will need to have the addition of Lasix instead of the HCTZ.  Repeat echocardiogram has been ordered and is currently pending. She does not report any episodes of chest pain or pressure. She walks her dog on a regular basis and has not had any issues with chest discomfort. Her EKG has ST  and T wave changes in the inferolateral leads which could be slightly different than previous EKG although it's difficult to tell for sure. If the echocardiogram shows any evidence of systolic dysfunction then we will reorder the [place] study that was ordered previously as an outpatient ( the patient did not show up for the myoview )  2. Sinus bradycardia: Beta blockers currently on hold. We'll continue to follow her blood pressure closely.   I have spent a total of 40 minutes with patient reviewing hospital  notes , telemetry, EKGs, labs and examining patient as well as establishing an assessment and plan that was discussed with the patient. > 50% of time was spent in direct patient care.    [place] Headings, Brooke Bonito., MD, [facility] 10/01/2017, 2:16 PM [address]  [place] Pager [phone]

## 2017-10-01 NOTE — Progress Notes (Signed)
  Echocardiogram 2D Echocardiogram has been performed.  Krista Rosales 10/01/2017, 4:07 PM

## 2017-10-02 DIAGNOSIS — I160 Hypertensive urgency: Secondary | ICD-10-CM

## 2017-10-02 DIAGNOSIS — I509 Heart failure, unspecified: Secondary | ICD-10-CM

## 2017-10-02 LAB — GLUCOSE, CAPILLARY
Glucose-Capillary: 92 mg/dL (ref 65–99)
Glucose-Capillary: 217 mg/dL — ABNORMAL HIGH (ref 65–99)
Glucose-Capillary: 188 mg/dL — ABNORMAL HIGH (ref 65–99)
Glucose-Capillary: 132 mg/dL — ABNORMAL HIGH (ref 65–99)

## 2017-10-02 LAB — BASIC METABOLIC PANEL
Anion gap: 11 (ref 5–15)
BUN: 28 mg/dL — ABNORMAL HIGH (ref 6–20)
CO2: 22 mmol/L (ref 22–32)
Calcium: 8.8 mg/dL — ABNORMAL LOW (ref 8.9–10.3)
Chloride: 107 mmol/L (ref 101–111)
Creatinine, Ser: 1.62 mg/dL — ABNORMAL HIGH (ref 0.44–1.00)
GFR calc Af Amer: 33 mL/min — ABNORMAL LOW (ref >60)
GFR calc non Af Amer: 29 mL/min — ABNORMAL LOW (ref >60)
Glucose, Bld: 102 mg/dL — ABNORMAL HIGH (ref 65–99)
Potassium: 4.1 mmol/L (ref 3.5–5.1)
Sodium: 140 mmol/L (ref 135–145)

## 2017-10-02 MED ORDER — CLONIDINE HCL 0.3 MG/24HR TD PTWK: 0.3000 mg | MEDICATED_PATCH | TRANSDERMAL | Status: DC

## 2017-10-02 MED ORDER — FUROSEMIDE 40 MG PO TABS
40.0000 mg | ORAL_TABLET | Freq: Every day | ORAL | Status: DC
Start: 2017-10-03 — End: 2017-10-03
  Administered 2017-10-03: 40 mg via ORAL
  Filled 2017-10-02: qty 1

## 2017-10-02 MED ORDER — SPIRONOLACTONE 25 MG PO TABS
25.0000 mg | ORAL_TABLET | Freq: Every day | ORAL | Status: DC
  Administered 2017-10-02 – 2017-10-03 (×2): 25 mg via ORAL
  Filled 2017-10-02 (×2): qty 1

## 2017-10-02 NOTE — Progress Notes (Addendum)
Dr. Raiford Simmonds notified that pauses have become more frequent (3 in a row ranging from 3 - 4.2 seconds) and longer in duration (5.73 seconds). Per provider, Zoll pads placed on pt and pt will be kept NPO. Will continue to monitor closely.   Andreas Blower, RN

## 2017-10-02 NOTE — Progress Notes (Signed)
Progress Note  Patient Name: Krista Rosales Date of Encounter: 10/02/2017  Primary Cardiologist: Dr. Thresa Ross   Subjective   No chest pain or SOB, feels better, no dizziness or lightheadedness.  Inpatient Medications    Scheduled Meds: . amLODipine  10 mg Oral Daily  . aspirin EC  81 mg Oral Daily  . cloNIDine  0.2 mg Transdermal Weekly  . enoxaparin (LOVENOX) injection  30 mg Subcutaneous QHS  . furosemide  40 mg Intravenous BID  . hydrALAZINE  100 mg Oral TID  . insulin aspart  0-5 Units Subcutaneous QHS  . insulin aspart  0-9 Units Subcutaneous TID WC  . polyethylene glycol  17 g Oral BID  . sodium chloride flush  3 mL Intravenous Q12H   Continuous Infusions: . sodium chloride     PRN Meds: sodium chloride, acetaminophen, ALPRAZolam, ondansetron (ZOFRAN) IV, sodium chloride flush   Vital Signs    Vitals:   10/01/17 2035 10/01/17 2354 10/02/17 0539 10/02/17 0809  BP: (!) 145/71 (!) 176/42 140/70 (!) 188/50  Pulse: 78 (!) 42 77 (!) 45  Resp: 19 18 20    Temp: 98.3 F (36.8 C) 98.6 F (37 C) 98.3 F (36.8 C) 98.2 F (36.8 C)  TempSrc: Oral Oral Oral Oral  SpO2: 99% 97% 100% 98%  Weight:   148 lb 4.8 oz (67.3 kg)   Height:        Intake/Output Summary (Last 24 hours) at 10/02/17 0848 Last data filed at 10/02/17 0005  Gross per 24 hour  Intake              960 ml  Output             2300 ml  Net            -1340 ml   Filed Weights   09/30/17 2224 10/01/17 0441 10/02/17 0539  Weight: 154 lb 11.2 oz (70.2 kg) 151 lb 8 oz (68.7 kg) 148 lb 4.8 oz (67.3 kg)    Telemetry    SR, accelerated junct rhythm and SB with longest 4.24 sec pause at 0043 AM.   - Personally Reviewed  ECG    No new  - Personally Reviewed  Physical Exam   GEN: No acute distress.   Neck: No JVD Cardiac: RRR, + systolic murmur, no rubs, or gallops.  Respiratory: Clear to auscultation bilaterally. GI: Soft, nontender, non-distended  MS: No edema; No deformity. Neuro:   Nonfocal  Psych: Normal affect   Labs    Chemistry Recent Labs Lab 10/01/17 0208 10/01/17 0823 10/02/17 0600  NA 138 139 140  K 3.6 3.7 4.1  CL 110 110 107  CO2 19* 19* 22  GLUCOSE 124* 100* 102*  BUN 23* 20 28*  CREATININE 1.42* 1.41* 1.62*  CALCIUM 9.0 8.9 8.8*  GFRNONAA 34* 34* 29*  GFRAA 39* 39* 33*  ANIONGAP 9 10 11      Hematology Recent Labs Lab 09/30/17 1417 10/01/17 0823  WBC 8.6 7.4  RBC 3.60* 3.61*  HGB 9.6* 9.5*  HCT 30.5* 30.0*  MCV 84.7 83.1  MCH 26.7 26.3  MCHC 31.5 31.7  RDW 16.0* 16.3*  PLT 397 357    Cardiac Enzymes Recent Labs Lab 09/30/17 2115 10/01/17 0208 10/01/17 0823  TROPONINI 0.03* 0.04* 0.04*    Recent Labs Lab 09/30/17 1441  TROPIPOC 0.04     BNP Recent Labs Lab 09/30/17 1417  BNP 2,068.0*     DDimer No results for input(s): DDIMER  in the last 168 hours.   Radiology    Dg Chest 2 View  Result Date: 09/30/2017 CLINICAL DATA:  83 year old female with a history of edema EXAM: CHEST  2 VIEW COMPARISON:  09/23/2017, 06/19/2015 FINDINGS: Cardiomediastinal silhouette unchanged in size and contour with cardiomegaly. Bilateral interlobular septal thickening with fullness in the central vasculature. Opacity at the left lung base persists, though improved. No confluent airspace disease.  No displaced fracture. IMPRESSION: Improving edema and left pleural effusion. Electronically Signed   By: Corrie Mckusick D.O.   On: 09/30/2017 18:32    Cardiac Studies   ECHO 10/01/17 Study Conclusions  - Left ventricle: The cavity size was normal. Wall thickness was   increased in a pattern of mild LVH. Systolic function was normal.   The estimated ejection fraction was in the range of 55% to 60%.   Wall motion was normal; there were no regional wall motion   abnormalities. Doppler parameters are consistent with   pseudonormal left ventricular relaxation (grade 2) diastolic   dysfunction. The E/e&' ratio is >15, suggesting elevated  LV   Filling pressure. - Aortic valve: Structurally normal valve. Trileaflet. Mean   gradient (S): 7 mm Hg. - Mitral valve: Mildly thickened leaflets . There was moderate   regurgitation. - Left atrium: Severely dilated. - Right ventricle: The cavity size was mildly dilated. - Right atrium: The atrium was mildly dilated. - Inferior vena cava: The vessel was normal in size. The   respirophasic diameter changes were in the normal range (>= 50%),   consistent with normal central venous pressure.  Impressions:  - Compared to a prior study in 2016, there are few changes. There   appears to be Grade 2 DD with elevated LV filling pressure. The   LA is severely dilated.  Echocardiogram 07/27/15: Study Conclusions - Left ventricle: The cavity size was normal. There was mild concentric hypertrophy. Systolic function was normal. The estimated ejection fraction was in the range of 55% to 60%. Wall motion was normal; there were no regional wall motion abnormalities. Features are consistent with a pseudonormal left ventricular filling pattern, with concomitant abnormal relaxation and increased filling pressure (grade 2 diastolic dysfunction). - Aortic valve: Mean gradient (S): 10 mm Hg. Peak gradient (S): 18 mm Hg. Valve area (VTI): 2.07 cm^2. Valve area (Vmax): 2.12 cm^2. Valve area (Vmean): 2.21 cm^2. - Mitral valve: There was mild regurgitation. - Left atrium: The atrium was severely dilated. - Right ventricle: The cavity size was mildly dilated. Wall thickness was normal. - Right atrium: The atrium was mildly dilated. - Atrial septum: The septum bowed from left to right, consistent with increased left atrial pressure.  Patient Profile     83 y.o. female with a hx of chronic diastolic heart failure, mild AS, mild MR, severe LAE, DOE, HTN, HLD, DM, arthritis who is being followed for the evaluation of acute on chronic congestive heart failure and SR in 80s to SB in  40s with pauses 3.48 sec and overnight 3-4.2 sec.  Assessment & Plan    Acute on chronic Diastolic HF  Continues with G2DD and EF 55-60% mod MR.  On echo yesterday  Improved today --BNP 2086  --wt on admit 154 lbs today 148 lbs --neg 3,100 since admit including neg 1640 yesterday --lasix 40 mg IV BID, on clonidine hydralazine and amlodipine, aldactone and liniopril (HCTZ  Stopped and lasix added) and bystolic (stopped only 24 hours)  Change lasix to po and hold anymore today  Sinus  Brady and pauses up to 4 sec.  Bouts of accelerated junct rhythm.  Bystolic is now stopped.   X 24 hours, ? EP consult today Vs see if HR improves in next 24 hours.  -I stopped NPO   AKI  Cr 1.37-> 1.42-> 1.41-> 1.62   Elevated troponin minimally 0.03 to 0.04, most likely from HF, no chest pain.   DM-2 per IM  Anemia  hgb 9.6    For questions or updates, please contact [place] Please consult [email or web address] for contact info under Cardiology/STEMI.      Signed, Cecilie Kicks, NP  10/02/2017, 8:48 AM    Attending Note:   The patient was seen and examined.  Agree with assessment and plan as noted above.  Changes made to the above note as needed.  Patient seen and independently examined with Cecilie Kicks, NP .   We discussed all aspects of the encounter. I agree with the assessment and plan as stated above.  1.  Sinus pauses:  Have DC'd bystolic . Continue to watch   2.  HTN :  BP is still very elevated  Will increase the clonidine to 0.3 mg patch Q week  Add aldactone 25 mg a day Follow BMP closely   3.  Junctinal rhythm:  Has occasional junctinal rhythm. Following     I have spent a total of 40 minutes with patient reviewing hospital  notes , telemetry, EKGs, labs and examining patient as well as establishing an assessment and plan that was discussed with the patient. > 50% of time was spent in direct patient care.    [place] Headings, Brooke Bonito., MD, [facility] 10/02/2017, 11:37 AM [address]  [place] Pager [phone]

## 2017-10-02 NOTE — Progress Notes (Signed)
PROGRESS NOTE    Krista Rosales  NOB:[id number] DOB: 16-Jun-1934 DOA: 09/30/2017 PCP: System, Pcp Not In   Brief Narrative:  Krista Rosales is a 83 y.o. female with medical history significant of diastolic CHF last EF 94-76% with grade 2 dFx, HTN, HLD, CAD, COPD, anemia, T2DM; who presents with complaints of progressively worsening shortness of breath over the last 1 week. Shortness of breath symptoms worsen with any exertion or on trying to lay flat at night. Patient reports associated symptoms of lower extremity swelling, orthopnea, and proximal nocturnal dyspnea. She was seen by her PCP last week and due to her symptoms was increased on her Lisinopril-hydrochlorothiazide 20-12.5 mg 1 tablet po daily to 20-25 mg 1.5 tablets twice daily. Patient denies any significant improvement in symptoms. She called back up at her doctor's office today and x-ray imaging showed vascular congestion and mild pulmonary edema which the patient came to the emergency department. She denies having any significant chest pain, palpitations, dysuria, nausea, vomiting, abdominal pain, fever, chills, lightheadedness, or diarrhea. Patient was previously followed by Dr. Stanford Breed of cardiology, but review of records shows the patient had not been seen since 12/2016. During that appointment patient was having precordial chest pain and was supposed to be set up for a myocardial perfusion study, but patient had 3 subsequent no-shows for appointments.  ED Course: Upon admission into the emergency department patient was seen to be afebrile, heart rate is 2979, respirations 14-16, blood pressure is elevated up to 210/53, and O2 saturations maintained at rest. With ambulation O2 saturations noted to drop down to 88%. Labs revealed WBC 8.6, hemoglobin 9.6, sodium 139, potassium 9, chloride 112, CO2 19, BUN 23, creatinine 1.37, BNP 2068, troponin 0.04. Patient was given 5 mg of hydralazine along with 40 mg of Lasix in the emergency  department. Patient initially wanted to leave right after drop in O2 saturations with ambulation was convinced to stay.  Assessment & Plan:   Principal Problem:   Diastolic congestive heart failure (HCC) Active Problems:   Diabetes type 2, uncontrolled (HCC)   Hypertensive urgency   Normocytic anemia   1-Acute Diastolic HF exacerbation;  Monitor out put. Daily weight.  Urine out put 2.6 L  Weight 68 K--67 ECHO; normal EF, grade 2 diastolic dysfunction.  Cardiology consulted.  Lasix change to oral, start tomorrow.  Monitor cr  2-HTN urgency ; Continue with amlodipine, clonidine, hydralazine, clonidine.  Clonidine will be increased.   Mild elevation troponin;  Related to HF, demand ischemia.  Echo normal EF  DM;  Last hemoglobin A1c noted to be 7.7 and 02/2015. Blood glucose on admission 144. SSI.   AKI;  Monitor on lasix.  Hold IV lasix.   Sinus Bradycardia and pauses,  Hold bystolic.  Cardiology consulted.  Replete electrolytes.  Continue to monitor.   Normocytic normochromic anemia:Chronic. Patient presents with a hemoglobin of 9.6 which upon review of records appears near patient's baseline. - Recheck CBC in a.m.  DVT prophylaxis: Lovenox Code Status: full code.  Family Communication: care discussed with patient.  Disposition Plan: to be determine   Consultants:   Cardiology    Procedures: ECHO pending.    Antimicrobials: none   Subjective: She is feeling well, denies dyspnea.    Objective: Vitals:   10/02/17 0539 10/02/17 0809 10/02/17 1100 10/02/17 1300  BP: 140/70 (!) 188/50 (!) 171/48   Pulse: 77 (!) 45  (!) 51  Resp: 20     Temp: 98.3 F (36.8 C) 98.2  F (36.8 C)  98 F (36.7 C)  TempSrc: Oral Oral  Oral  SpO2: 100% 98%  98%  Weight: 67.3 kg (148 lb 4.8 oz)     Height:        Intake/Output Summary (Last 24 hours) at 10/02/17 1421 Last data filed at 10/02/17 0005  Gross per 24 hour  Intake              480 ml  Output              1300 ml  Net             -820 ml   Filed Weights   09/30/17 2224 10/01/17 0441 10/02/17 0539  Weight: 70.2 kg (154 lb 11.2 oz) 68.7 kg (151 lb 8 oz) 67.3 kg (148 lb 4.8 oz)    Examination:  General exam: NAD Respiratory system; CTA Cardiovascular system: S 1, S 2 RRR Gastrointestinal system: abdomen , sof,t nt Central nervous system: non focal.  Extremities: Symmetric power.  Skin: No rashes, lesions or ulcers    Data Reviewed: I have personally reviewed following labs and imaging studies  CBC:  Recent Labs Lab 09/30/17 1417 10/01/17 0823  WBC 8.6 7.4  NEUTROABS  --  5.5  HGB 9.6* 9.5*  HCT 30.5* 30.0*  MCV 84.7 83.1  PLT 397 606   Basic Metabolic Panel:  Recent Labs Lab 09/30/17 1417 10/01/17 0208 10/01/17 0823 10/02/17 0600  NA 139 138 139 140  K 4.0 3.6 3.7 4.1  CL 112* 110 110 107  CO2 19* 19* 19* 22  GLUCOSE 144* 124* 100* 102*  BUN 23* 23* 20 28*  CREATININE 1.37* 1.42* 1.41* 1.62*  CALCIUM 9.0 9.0 8.9 8.8*  MG  --   --  1.7  --    GFR: Estimated Creatinine Clearance: 27.5 mL/min (A) (by C-G formula based on SCr of 1.62 mg/dL (H)). Liver Function Tests: No results for input(s): AST, ALT, ALKPHOS, BILITOT, PROT, ALBUMIN in the last 168 hours. No results for input(s): LIPASE, AMYLASE in the last 168 hours. No results for input(s): AMMONIA in the last 168 hours. Coagulation Profile: No results for input(s): INR, PROTIME in the last 168 hours. Cardiac Enzymes:  Recent Labs Lab 09/30/17 2115 10/01/17 0208 10/01/17 0823  TROPONINI 0.03* 0.04* 0.04*   BNP (last 3 results) No results for input(s): PROBNP in the last 8760 hours. HbA1C: No results for input(s): HGBA1C in the last 72 hours. CBG:  Recent Labs Lab 10/01/17 1219 10/01/17 1603 10/01/17 2042 10/02/17 0737 10/02/17 1130  GLUCAP 133* 115* 161* 92 217*   Lipid Profile: No results for input(s): CHOL, HDL, LDLCALC, TRIG, CHOLHDL, LDLDIRECT in the last 72 hours. Thyroid Function  Tests: No results for input(s): TSH, T4TOTAL, FREET4, T3FREE, THYROIDAB in the last 72 hours. Anemia Panel: No results for input(s): VITAMINB12, FOLATE, FERRITIN, TIBC, IRON, RETICCTPCT in the last 72 hours. Sepsis Labs: No results for input(s): PROCALCITON, LATICACIDVEN in the last 168 hours.  Recent Results (from the past 240 hour(s))  MRSA PCR Screening     Status: None   Collection Time: 09/30/17 10:56 PM  Result Value Ref Range Status   MRSA by PCR NEGATIVE NEGATIVE Final    Comment:        The GeneXpert MRSA Assay (FDA approved for NASAL specimens only), is one component of a comprehensive MRSA colonization surveillance program. It is not intended to diagnose MRSA infection nor to guide or monitor treatment for MRSA infections.  Radiology Studies: Dg Chest 2 View  Result Date: 09/30/2017 CLINICAL DATA:  83 year old female with a history of edema EXAM: CHEST  2 VIEW COMPARISON:  09/23/2017, 06/19/2015 FINDINGS: Cardiomediastinal silhouette unchanged in size and contour with cardiomegaly. Bilateral interlobular septal thickening with fullness in the central vasculature. Opacity at the left lung base persists, though improved. No confluent airspace disease.  No displaced fracture. IMPRESSION: Improving edema and left pleural effusion. Electronically Signed   By: Corrie Mckusick D.O.   On: 09/30/2017 18:32        Scheduled Meds: . amLODipine  10 mg Oral Daily  . aspirin EC  81 mg Oral Daily  . [START ON 10/07/2017] cloNIDine  0.3 mg Transdermal Q Wed-1800  . enoxaparin (LOVENOX) injection  30 mg Subcutaneous QHS  . [START ON 10/03/2017] furosemide  40 mg Oral Daily  . hydrALAZINE  100 mg Oral TID  . insulin aspart  0-5 Units Subcutaneous QHS  . insulin aspart  0-9 Units Subcutaneous TID WC  . polyethylene glycol  17 g Oral BID  . sodium chloride flush  3 mL Intravenous Q12H  . spironolactone  25 mg Oral Daily   Continuous Infusions: . sodium chloride        LOS: 1 day    Time spent: 35 minutes.     Elmarie Shiley, MD [facility] Pager [phone]  If 7PM-7AM, please contact night-coverage [email or web address] Password [email or web address] 10/02/2017, 2:21 PM

## 2017-10-02 NOTE — Consult Note (Signed)
           [facility] Primary Care Navigator  10/02/2017  Krista Rosales [birth year] [id number]   Went to see patient at the bedside to identify possible discharge needs. Patientreports having shortness of breath, swelling to lower extremities and irregular heart beats thathad led to this admission.  Patientendorses Dr. Josetta Huddle with [facility] as herprimary care provider.   Patient verbalized using [employer] [facility] and [facility] on [address] [place] to obtain medications without difficulty.   Patient reportsmanagingher own medications at home straight out of the containers.  Patient states that she was driving prior to admission, but her brother Tarri Abernethy) or cousin Pilar Plate) will be able to providetransportation to her doctors'appointments after discharge if needed.  Patient verbalized that she lives with her brother but is independent with self care. She mentioned having nieces and nephews who live nearby that can provide assistance with her care when needed.  Anticipateddischarge plan: would likely discharge home once medically stable per MD note.  Patientvoiced understanding to call primary care provider's officewhen shereturnshome,for a post discharge follow-up within a week or sooner if needs arise.Patient letter (with PCP's contact number) was provided as areminder.   Discussed with patient regarding [facility] CM services available for health management at home but she verbalized decline of services. Patient adamantly refused [facility] services and [facility]MI calls to follow-up recovery that was offered to her on different times. She states "I know how to care for myself and that's what I do".  Encouraged patient to seekreferral from primary care provider to [facility] care management if she changes her mind and if deemed necessaryand appropriate for services in the near future.   [facility] care management information was  provided for future needs that she may have.  Primary care provider's office is listed as doing transition of care (TOC).   For questions, please contact:  Dannielle Huh, BSN, RN- [facility] Primary Care Navigator  Telephone: [phone] [place]

## 2017-10-03 ENCOUNTER — Other Ambulatory Visit: Payer: Self-pay | Admitting: Physician Assistant

## 2017-10-03 DIAGNOSIS — I471 Supraventricular tachycardia: Secondary | ICD-10-CM

## 2017-10-03 DIAGNOSIS — R001 Bradycardia, unspecified: Secondary | ICD-10-CM

## 2017-10-03 DIAGNOSIS — I5031 Acute diastolic (congestive) heart failure: Secondary | ICD-10-CM

## 2017-10-03 LAB — GLUCOSE, CAPILLARY
Glucose-Capillary: 104 mg/dL — ABNORMAL HIGH (ref 65–99)
Glucose-Capillary: 171 mg/dL — ABNORMAL HIGH (ref 65–99)

## 2017-10-03 LAB — BASIC METABOLIC PANEL
Anion gap: 8 (ref 5–15)
BUN: 30 mg/dL — ABNORMAL HIGH (ref 6–20)
CO2: 23 mmol/L (ref 22–32)
Calcium: 8.2 mg/dL — ABNORMAL LOW (ref 8.9–10.3)
Chloride: 107 mmol/L (ref 101–111)
Creatinine, Ser: 1.61 mg/dL — ABNORMAL HIGH (ref 0.44–1.00)
GFR calc Af Amer: 33 mL/min — ABNORMAL LOW (ref >60)
GFR calc non Af Amer: 29 mL/min — ABNORMAL LOW (ref >60)
Glucose, Bld: 96 mg/dL (ref 65–99)
Potassium: 3.9 mmol/L (ref 3.5–5.1)
Sodium: 138 mmol/L (ref 135–145)

## 2017-10-03 MED ORDER — CLONIDINE HCL 0.3 MG/24HR TD PTWK
0.3000 mg | MEDICATED_PATCH | TRANSDERMAL | Status: DC
  Administered 2017-10-03: 0.3 mg via TRANSDERMAL
  Filled 2017-10-03: qty 1

## 2017-10-03 MED ORDER — FUROSEMIDE 40 MG PO TABS: 40.0000 mg | ORAL_TABLET | Freq: Every day | ORAL | 0 refills | Status: DC

## 2017-10-03 MED ORDER — CLONIDINE HCL 0.3 MG/24HR TD PTWK: 0.3000 mg | MEDICATED_PATCH | TRANSDERMAL | 12 refills | Status: DC

## 2017-10-03 NOTE — Discharge Summary (Addendum)
Physician Discharge Summary  Krista Rosales NIO:[id number] DOB: June 18, 1934 DOA: 09/30/2017  PCP: System, Pcp Not In  Admit date: 09/30/2017 Discharge date: 10/03/2017  Admitted From: Home  Disposition:  Home   Recommendations for Outpatient Follow-up:  1. Follow up with PCP in 1-2 weeks 2. Please obtain BMP/CBC in one week 3. Please follow up on the following pending results: need holter monitor.  4. Needs B-met in 48 hours to follow Cr level.      Discharge Condition: stable.  CODE STATUS: full code.  Diet recommendation: Heart Healthy  Brief/Interim Summary: Krista Brodhead Reynoldsis a 83 y.o.femalewith medical history significant of diastolic CHF last EF 81-82% with grade 2 dFx, HTN, HLD, CAD, COPD, anemia, T2DM; who presents with complaints of progressively worsening shortness of breath over the last 1 week. Shortness of breath symptoms worsen with any exertion or on trying to lay flat at night. Patient reports associated symptoms of lower extremity swelling,orthopnea, andproximal nocturnal dyspnea.She was seen by her PCP last week and due to her symptoms was increased on her Lisinopril-hydrochlorothiazide 20-12.5 mg 1 tablet po daily to 20-25 mg 1.5tablets twice daily. Patient denies any significant improvement in symptoms. She called back up at her doctor's office today and x-ray imaging showed vascular congestion and mild pulmonary edema which the patient came to the emergency department. She denies having any significant chest pain, palpitations, dysuria, nausea, vomiting, abdominal pain, fever, chills, lightheadedness, or diarrhea. Patient was previously followed by Dr. Stanford Breed of cardiology, but review of records shows the patient had not been seen since 12/2016. During that appointment patient was having precordial chest pain and was supposed to be set up for a myocardial perfusion study, but patient had 3 subsequent no-shows for appointments.  ED Course:Upon admission into the  emergency department patient was seen to be afebrile, heart rate is 2979, respirations 14-16, blood pressure is elevated up to 210/53, and O2 saturations maintained at rest. With ambulation O2 saturations noted to drop down to 88%. Labs revealed WBC 8.6, hemoglobin 9.6, sodium 139, potassium 9, chloride 112, CO2 19, BUN 23, creatinine 1.37, BNP 2068, troponin 0.04. Patient was given 5 mg of hydralazine along with 40 mg of Lasix in the emergency department. Patient initially wanted to leave right after drop in O2 saturations with ambulation was convinced to stay.  Assessment & Plan:   Principal Problem:   Diastolic congestive heart failure (HCC) Active Problems:   Diabetes type 2   Hypertensive urgency   Normocytic anemia   1-Acute Diastolic HF exacerbation;  Monitor out put. Daily weight.  Urine out put 2.6 L  Weight 68 K--67 ECHO; normal EF, grade 2 diastolic dysfunction.  Cardiology consulted.  Lasix change to oral today.  Monitor cr  2-HTN urgency ; Continue with amlodipine, clonidine, hydralazine, clonidine.  Clonidine increase to 0.3 patch   Mild elevation troponin;  Related to HF, demand ischemia.  Echo normal EF  DM;  Last hemoglobin A1c noted to be 7.7 and 02/2015. Blood glucose on admission 144. SSI.   AKI;  Monitor on lasix.  Discharge on oral lasix 40 mg daily   Sinus Bradycardia and pauses,  Hold bystolic.  Cardiology consulted.  Replete electrolytes.  Holter out patient  Bystolic sopped.   Normocytic normochromic anemia:Chronic. Patient presents with a hemoglobin of 9.6 which upon review of records appears near patient's baseline. Hb stable.    Discharge Diagnoses:  Principal Problem:   Diastolic congestive heart failure (HCC) Active Problems:   Diabetes type 2,  uncontrolled ([place])   Hypertensive urgency   Normocytic anemia    Discharge Instructions  Discharge Instructions    Diet - low sodium heart healthy    Complete by:  As directed     Increase activity slowly    Complete by:  As directed      Allergies as of 10/03/2017      Reactions   Codeine    Penicillins    Wellbutrin [bupropion]       Medication List    STOP taking these medications   cloNIDine 0.2 MG tablet Commonly known as:  CATAPRES Replaced by:  cloNIDine 0.3 mg/24hr patch   isosorbide mononitrate 60 MG 24 hr tablet Commonly known as:  IMDUR   lisinopril-hydrochlorothiazide 20-12.5 MG tablet Commonly known as:  PRINZIDE,ZESTORETIC   lisinopril-hydrochlorothiazide 20-25 MG tablet Commonly known as:  PRINZIDE,ZESTORETIC   metFORMIN 1000 MG tablet Commonly known as:  GLUCOPHAGE   nebivolol 5 MG tablet Commonly known as:  BYSTOLIC     TAKE these medications   acetaminophen 500 MG tablet Commonly known as:  TYLENOL Take 500 mg by mouth every 6 (six) hours as needed.   ALPRAZolam 0.25 MG tablet Commonly known as:  XANAX Take 0.25 mg by mouth daily as needed.   amLODipine 10 MG tablet Commonly known as:  NORVASC Take 10 mg by mouth daily.   aspirin 81 MG tablet Take 81 mg by mouth daily.   cholecalciferol 1000 units tablet Commonly known as:  VITAMIN D Take 1,000 Units by mouth 2 (two) times daily.   cloNIDine 0.3 mg/24hr patch Commonly known as:  CATAPRES - Dosed in mg/24 hr Place 1 patch (0.3 mg total) onto the skin every Wednesday at 6 PM. Replaces:  cloNIDine 0.2 MG tablet   furosemide 40 MG tablet Commonly known as:  LASIX Take 1 tablet (40 mg total) by mouth daily.   hydrALAZINE 100 MG tablet Commonly known as:  APRESOLINE Take 1 tablet (100 mg total) by mouth every 6 (six) hours. What changed:  when to take this   spironolactone 25 MG tablet Commonly known as:  ALDACTONE Take 25 mg by mouth daily.   vitamin B-12 500 MCG tablet Commonly known as:  CYANOCOBALAMIN Take 500 mcg by mouth daily.      Follow-up Information    Lelon Perla, MD Follow up in 1 week(s).   Specialty:  Cardiology Contact  information: [address] [place] [phone]          Allergies  Allergen Reactions  . Codeine   . Penicillins   . Wellbutrin [Bupropion]     Consultations:  Cardiology    Procedures/Studies: Dg Chest 2 View  Result Date: 09/30/2017 CLINICAL DATA:  83 year old female with a history of edema EXAM: CHEST  2 VIEW COMPARISON:  09/23/2017, 06/19/2015 FINDINGS: Cardiomediastinal silhouette unchanged in size and contour with cardiomegaly. Bilateral interlobular septal thickening with fullness in the central vasculature. Opacity at the left lung base persists, though improved. No confluent airspace disease.  No displaced fracture. IMPRESSION: Improving edema and left pleural effusion. Electronically Signed   By: Corrie Mckusick D.O.   On: 09/30/2017 18:32   Dg Chest 2 View  Result Date: 09/23/2017 CLINICAL DATA:  Increase shortness of breath EXAM: CHEST  2 VIEW COMPARISON:  06/19/2015 FINDINGS: Small left pleural effusion. Cardiomegaly with central vascular congestion and mild diffuse interstitial opacities suspicious for edema. No pneumothorax. Aortic atherosclerosis. Degenerative changes of the spine. IMPRESSION: Cardiomegaly with central  vascular congestion and mild pulmonary edema. Small left pleural effusion. Electronically Signed   By: Donavan Foil M.D.   On: 09/23/2017 16:37    (Echo, Carotid, EGD, Colonoscopy, ERCP)    Subjective:   Discharge Exam: Vitals:   10/03/17 0420 10/03/17 0742  BP: (!) 172/44 (!) 169/54  Pulse: (!) 45 (!) 52  Resp: (!) 24 19  Temp: 98.1 F (36.7 C) 98.4 F (36.9 C)  SpO2: 97% 97%   Vitals:   10/02/17 1930 10/03/17 0000 10/03/17 0420 10/03/17 0742  BP: (!) 136/54 (!) 181/94 (!) 172/44 (!) 169/54  Pulse: 78  (!) 45 (!) 52  Resp: 17 20 (!) 24 19  Temp: 99.2 F (37.3 C) 98.4 F (36.9 C) 98.1 F (36.7 C) 98.4 F (36.9 C)  TempSrc: Oral Oral Oral Oral  SpO2: 98% 99% 97% 97%  Weight:   65 kg (143 lb 4.8 oz)    Height:        General: Pt is alert, awake, not in acute distress Cardiovascular: RRR, S1/S2 +, no rubs, no gallops Respiratory: CTA bilaterally, no wheezing, no rhonchi Abdominal: Soft, NT, ND, bowel sounds + Extremities: no edema, no cyanosis    The results of significant diagnostics from this hospitalization (including imaging, microbiology, ancillary and laboratory) are listed below for reference.     Microbiology: Recent Results (from the past 240 hour(s))  MRSA PCR Screening     Status: None   Collection Time: 09/30/17 10:56 PM  Result Value Ref Range Status   MRSA by PCR NEGATIVE NEGATIVE Final    Comment:        The GeneXpert MRSA Assay (FDA approved for NASAL specimens only), is one component of a comprehensive MRSA colonization surveillance program. It is not intended to diagnose MRSA infection nor to guide or monitor treatment for MRSA infections.      Labs: BNP (last 3 results)  Recent Labs  09/30/17 1417  BNP 0,037.0*   Basic Metabolic Panel:  Recent Labs Lab 09/30/17 1417 10/01/17 0208 10/01/17 0823 10/02/17 0600 10/03/17 0533  NA 139 138 139 140 138  K 4.0 3.6 3.7 4.1 3.9  CL 112* 110 110 107 107  CO2 19* 19* 19* 22 23  GLUCOSE 144* 124* 100* 102* 96  BUN 23* 23* 20 28* 30*  CREATININE 1.37* 1.42* 1.41* 1.62* 1.61*  CALCIUM 9.0 9.0 8.9 8.8* 8.2*  MG  --   --  1.7  --   --    Liver Function Tests: No results for input(s): AST, ALT, ALKPHOS, BILITOT, PROT, ALBUMIN in the last 168 hours. No results for input(s): LIPASE, AMYLASE in the last 168 hours. No results for input(s): AMMONIA in the last 168 hours. CBC:  Recent Labs Lab 09/30/17 1417 10/01/17 0823  WBC 8.6 7.4  NEUTROABS  --  5.5  HGB 9.6* 9.5*  HCT 30.5* 30.0*  MCV 84.7 83.1  PLT 397 357   Cardiac Enzymes:  Recent Labs Lab 09/30/17 2115 10/01/17 0208 10/01/17 0823  TROPONINI 0.03* 0.04* 0.04*   BNP: Invalid input(s): POCBNP CBG:  Recent Labs Lab  10/02/17 0737 10/02/17 1130 10/02/17 1637 10/02/17 2110 10/03/17 0732  GLUCAP 92 217* 188* 132* 104*   D-Dimer No results for input(s): DDIMER in the last 72 hours. Hgb A1c No results for input(s): HGBA1C in the last 72 hours. Lipid Profile No results for input(s): CHOL, HDL, LDLCALC, TRIG, CHOLHDL, LDLDIRECT in the last 72 hours. Thyroid function studies No results for input(s): TSH, T4TOTAL,  T3FREE, THYROIDAB in the last 72 hours.  Invalid input(s): FREET3 Anemia work up No results for input(s): VITAMINB12, FOLATE, FERRITIN, TIBC, IRON, RETICCTPCT in the last 72 hours. Urinalysis    Component Value Date/Time   COLORURINE YELLOW 02/20/2015 2211   APPEARANCEUR CLEAR 02/20/2015 2211   LABSPEC 1.017 02/20/2015 2211   PHURINE 6.0 02/20/2015 2211   GLUCOSEU 100 (A) 02/20/2015 2211   GLUCOSEU >=1000 10/12/2009 0935   HGBUR NEGATIVE 02/20/2015 2211   BILIRUBINUR NEGATIVE 02/20/2015 2211   KETONESUR NEGATIVE 02/20/2015 2211   PROTEINUR >300 (A) 02/20/2015 2211   UROBILINOGEN 0.2 02/20/2015 2211   NITRITE NEGATIVE 02/20/2015 2211   LEUKOCYTESUR NEGATIVE 02/20/2015 2211   Sepsis Labs Invalid input(s): PROCALCITONIN,  WBC,  LACTICIDVEN Microbiology Recent Results (from the past 240 hour(s))  MRSA PCR Screening     Status: None   Collection Time: 09/30/17 10:56 PM  Result Value Ref Range Status   MRSA by PCR NEGATIVE NEGATIVE Final    Comment:        The GeneXpert MRSA Assay (FDA approved for NASAL specimens only), is one component of a comprehensive MRSA colonization surveillance program. It is not intended to diagnose MRSA infection nor to guide or monitor treatment for MRSA infections.      Time coordinating discharge: Over 30 minutes  SIGNED:   Elmarie Shiley, MD  [facility] 10/03/2017, 10:52 AM Pager   If 7PM-7AM, please contact night-coverage [email or web address] Password [email or web address]

## 2017-10-03 NOTE — Progress Notes (Signed)
Patient had a 3.62 second pause. Patient is asymptomatic. Continue to monitor.

## 2017-10-03 NOTE — Progress Notes (Signed)
Gave patient discharge instructions. Patient verbalized understanding. Removed IV and EKG leads.

## 2017-10-03 NOTE — Progress Notes (Signed)
Progress Note  Patient Name: Krista Rosales Date of Encounter: 10/03/2017  Primary Cardiologist: Dr. Thresa Ross   Subjective   Denies any chest pain or SOB.  No dizziness  Inpatient Medications    Scheduled Meds: . amLODipine  10 mg Oral Daily  . aspirin EC  81 mg Oral Daily  . [START ON 10/07/2017] cloNIDine  0.3 mg Transdermal Q Wed-1800  . enoxaparin (LOVENOX) injection  30 mg Subcutaneous QHS  . furosemide  40 mg Oral Daily  . hydrALAZINE  100 mg Oral TID  . insulin aspart  0-5 Units Subcutaneous QHS  . insulin aspart  0-9 Units Subcutaneous TID WC  . polyethylene glycol  17 g Oral BID  . sodium chloride flush  3 mL Intravenous Q12H  . spironolactone  25 mg Oral Daily   Continuous Infusions: . sodium chloride     PRN Meds: sodium chloride, acetaminophen, ALPRAZolam, ondansetron (ZOFRAN) IV, sodium chloride flush   Vital Signs    Vitals:   10/02/17 1930 10/03/17 0000 10/03/17 0420 10/03/17 0742  BP: (!) 136/54 (!) 181/94 (!) 172/44 (!) 169/54  Pulse: 78  (!) 45 (!) 52  Resp: 17 20 (!) 24 19  Temp: 99.2 F (37.3 C) 98.4 F (36.9 C) 98.1 F (36.7 C) 98.4 F (36.9 C)  TempSrc: Oral Oral Oral Oral  SpO2: 98% 99% 97% 97%  Weight:   143 lb 4.8 oz (65 kg)   Height:        Intake/Output Summary (Last 24 hours) at 10/03/17 0842 Last data filed at 10/03/17 0744  Gross per 24 hour  Intake                0 ml  Output             1900 ml  Net            -1900 ml   Filed Weights   10/01/17 0441 10/02/17 0539 10/03/17 0420  Weight: 151 lb 8 oz (68.7 kg) 148 lb 4.8 oz (67.3 kg) 143 lb 4.8 oz (65 kg)    Telemetry      - Personally Reviewed  ECG    No new  - Personally Reviewed  Physical Exam   GEN: WD, WN in NAD Neck: supple no JVD Cardiac:RRR with no M/R/G Respiratory: CTA bilaterally GI: soft, NT, ND with active BS MS: no edema Neuro:  A&O x 3 Psych: normal mood  Labs    Chemistry  Recent Labs Lab 10/01/17 0823 10/02/17 0600  10/03/17 0533  NA 139 140 138  K 3.7 4.1 3.9  CL 110 107 107  CO2 19* 22 23  GLUCOSE 100* 102* 96  BUN 20 28* 30*  CREATININE 1.41* 1.62* 1.61*  CALCIUM 8.9 8.8* 8.2*  GFRNONAA 34* 29* 29*  GFRAA 39* 33* 33*  ANIONGAP 10 11 8      Hematology  Recent Labs Lab 09/30/17 1417 10/01/17 0823  WBC 8.6 7.4  RBC 3.60* 3.61*  HGB 9.6* 9.5*  HCT 30.5* 30.0*  MCV 84.7 83.1  MCH 26.7 26.3  MCHC 31.5 31.7  RDW 16.0* 16.3*  PLT 397 357    Cardiac Enzymes  Recent Labs Lab 09/30/17 2115 10/01/17 0208 10/01/17 0823  TROPONINI 0.03* 0.04* 0.04*     Recent Labs Lab 09/30/17 1441  TROPIPOC 0.04     BNP  Recent Labs Lab 09/30/17 1417  BNP 2,068.0*     DDimer No results for input(s): DDIMER in the last  168 hours.   Radiology    No results found.  Cardiac Studies   ECHO 10/01/17 Study Conclusions  - Left ventricle: The cavity size was normal. Wall thickness was   increased in a pattern of mild LVH. Systolic function was normal.   The estimated ejection fraction was in the range of 55% to 60%.   Wall motion was normal; there were no regional wall motion   abnormalities. Doppler parameters are consistent with   pseudonormal left ventricular relaxation (grade 2) diastolic   dysfunction. The E/e&' ratio is >15, suggesting elevated LV   Filling pressure. - Aortic valve: Structurally normal valve. Trileaflet. Mean   gradient (S): 7 mm Hg. - Mitral valve: Mildly thickened leaflets . There was moderate   regurgitation. - Left atrium: Severely dilated. - Right ventricle: The cavity size was mildly dilated. - Right atrium: The atrium was mildly dilated. - Inferior vena cava: The vessel was normal in size. The   respirophasic diameter changes were in the normal range (>= 50%),   consistent with normal central venous pressure.  Impressions:  - Compared to a prior study in 2016, there are few changes. There   appears to be Grade 2 DD with elevated LV filling  pressure. The   LA is severely dilated.  Echocardiogram 07/27/15: Study Conclusions - Left ventricle: The cavity size was normal. There was mild concentric hypertrophy. Systolic function was normal. The estimated ejection fraction was in the range of 55% to 60%. Wall motion was normal; there were no regional wall motion abnormalities. Features are consistent with a pseudonormal left ventricular filling pattern, with concomitant abnormal relaxation and increased filling pressure (grade 2 diastolic dysfunction). - Aortic valve: Mean gradient (S): 10 mm Hg. Peak gradient (S): 18 mm Hg. Valve area (VTI): 2.07 cm^2. Valve area (Vmax): 2.12 cm^2. Valve area (Vmean): 2.21 cm^2. - Mitral valve: There was mild regurgitation. - Left atrium: The atrium was severely dilated. - Right ventricle: The cavity size was mildly dilated. Wall thickness was normal. - Right atrium: The atrium was mildly dilated. - Atrial septum: The septum bowed from left to right, consistent with increased left atrial pressure.  Patient Profile     83 y.o. female with a hx of chronic diastolic heart failure, mild AS, mild MR, severe LAE, DOE, HTN, HLD, DM, arthritis who is being followed for the evaluation of acute on chronic congestive heart failure and SR in 80s to SB in 40s with pauses 3.48 sec and overnight 3-4.2 sec.  Assessment & Plan    Acute on chronic Diastolic HF  Continues with G2DD and EF 55-60% mod MR.   --BNP 2086 on admit --wt on admit 154 lbs today 143 lbs (down 5 lbs since yesterday) --neg 5L since admit - out 1.8L in past 24 hours --lasix 40 mg PO daily --appears euvolemic on exam today.  Sinus Brady and pauses up to 4 sec.  Bouts of accelerated junct rhythm.  Bystolic is now stopped X 48 hours.  Reviewed tele and appears to be a slow AVNRT vs. Junctional tachycardia at 80-90bpm and then breaks into sinus bradycardia.   - keep off Bystolic - she is asymptomatic so would not treat  AVNRT since it is not fast- discussed with EP - will get an outpt event monitor to rule out significant bradycardia or pauses  AKI  Cr 1.37-> 1.42-> 1.41-> 1.62 -> 1.61  Elevated troponin minimally 0.03 to 0.04, most likely from HF, no chest pain.  - no further  workup at this time  DM-2 per IM  Anemia  hgb 9.6  Stable from a cardiac standpoint for discharge.  Will set up outpt heart monitor.  Will sign off.  Call with any questions.    For questions or updates, please contact [place] Please consult [email or web address] for contact info under Cardiology/STEMI.      Signed, Fransico Him, MD  10/03/2017, 8:42 AM    Attending Note:   The patient was seen and examined.  Agree with assessment and plan as noted above.  Changes made to the above note as needed.  Patient seen and independently examined with Cecilie Kicks, NP .   We discussed all aspects of the encounter. I agree with the assessment and plan as stated above.  1.  Sinus pauses:  Have DC'd bystolic . Continue to watch   2.  HTN :  BP is still very elevated  Will increase the clonidine to 0.3 mg patch Q week  Add aldactone 25 mg a day Follow BMP closely   3.  Junctinal rhythm:  Has occasional junctinal rhythm. Following     I have spent a total of 40 minutes with patient reviewing hospital  notes , telemetry, EKGs, labs and examining patient as well as establishing an assessment and plan that was discussed with the patient. > 50% of time was spent in direct patient care.    Thayer Headings, Brooke Bonito., MD, [facility] 10/03/2017, 8:42 AM [address]  [place] Pager [phone]

## 2017-10-06 DIAGNOSIS — I10 Essential (primary) hypertension: Secondary | ICD-10-CM | POA: Diagnosis not present

## 2017-10-06 DIAGNOSIS — J449 Chronic obstructive pulmonary disease, unspecified: Secondary | ICD-10-CM | POA: Diagnosis not present

## 2017-10-06 DIAGNOSIS — E1142 Type 2 diabetes mellitus with diabetic polyneuropathy: Secondary | ICD-10-CM | POA: Diagnosis not present

## 2017-10-06 DIAGNOSIS — E1165 Type 2 diabetes mellitus with hyperglycemia: Secondary | ICD-10-CM | POA: Diagnosis not present

## 2017-10-06 DIAGNOSIS — E1121 Type 2 diabetes mellitus with diabetic nephropathy: Secondary | ICD-10-CM | POA: Diagnosis not present

## 2017-10-06 DIAGNOSIS — I509 Heart failure, unspecified: Secondary | ICD-10-CM | POA: Diagnosis not present

## 2017-10-07 ENCOUNTER — Telehealth: Payer: Self-pay | Admitting: Cardiology

## 2017-10-07 NOTE — Telephone Encounter (Signed)
Closed Encounter  °

## 2017-10-13 ENCOUNTER — Ambulatory Visit (INDEPENDENT_AMBULATORY_CARE_PROVIDER_SITE_OTHER): Payer: Medicare HMO | Admitting: Physician Assistant

## 2017-10-13 ENCOUNTER — Encounter: Payer: Self-pay | Admitting: Physician Assistant

## 2017-10-13 VITALS — BP 156/60 | HR 73 | Resp 16 | Ht 68.0 in | Wt 149.0 lb

## 2017-10-13 DIAGNOSIS — I5032 Chronic diastolic (congestive) heart failure: Secondary | ICD-10-CM | POA: Diagnosis not present

## 2017-10-13 DIAGNOSIS — I495 Sick sinus syndrome: Secondary | ICD-10-CM | POA: Diagnosis not present

## 2017-10-13 DIAGNOSIS — E119 Type 2 diabetes mellitus without complications: Secondary | ICD-10-CM

## 2017-10-13 DIAGNOSIS — E785 Hyperlipidemia, unspecified: Secondary | ICD-10-CM

## 2017-10-13 DIAGNOSIS — I10 Essential (primary) hypertension: Secondary | ICD-10-CM

## 2017-10-13 DIAGNOSIS — Z79899 Other long term (current) drug therapy: Secondary | ICD-10-CM

## 2017-10-13 NOTE — Progress Notes (Signed)
Cardiology Office Note    Date:  10/13/2017   ID:  Krista Rosales, DOB 1934/09/16, MRN [id number]  PCP:  Krista Huddle, MD  Cardiologist:  Dr. Stanford Breed   Chief Complaint  Patient presents with  . Hospitalization Follow-up    seen for Dr. Stanford Breed, s/p diuresis    History of Present Illness:  KYLIEANN Rosales is a 83 y.o. female with PMH of chronic diastolic HF, mild AS, mild MR, severe LAE, HTN, HLD, DM II who presented recently to the hospital on 09/30/2017 with shortness of breath. She was sent to the hospital on her PCPs office with complaint of 4 days history of shortness of breath and lower extremity swelling. She did not have any chest pain. Telemetry also showed short periods of normal sinus rhythm with bradycardia with heart rate into the 40s and sinus pauses up to 3.48 sec. Her beta blocker was held. Echocardiogram obtained on 10/01/2017 showed EF 99-35%, grade 2 diastolic dysfunction, severely dilated left atrium, moderate MR. She underwent IV diuresis, her weight decreased from 154 pounds on admission down to 143 pounds. She was discharged on Lasix 40 mg daily. After holding her beta blocker, she had episodes of AVNRT versus junctional tachycardia, the case was discussed with EP and she was discharged on a 30 day event monitor. She also had worsening renal function as well her to discharge.  She is scheduled to pick up an event monitor on November 1. Since discharge, she denies any significant chest discomfort or shortness of breath. Her heart rate has improved and it runs in the 70s today. Her blood pressure however continued to be elevated. I plan to add additional blood pressure medication, however she wished to hold off. She says her blood pressure normally runs in the 130s at home. She will keep a blood pressure diary prior to the next office visit. She appears to be euvolemic on physical exam despite having diminished breath sounds in bilateral bases. She has no lower extremity  edema or JVD. I will continue her on the current dose of Lasix. I plan to obtain basic metabolic panel today.    Past Medical History:  Diagnosis Date  . ANEMIA, IRON DEFICIENCY 05/07/2009   Qualifier: Diagnosis of  By: Loanne Drilling MD, Jacelyn Pi   . ARTHRITIS 05/30/2008   Qualifier: Diagnosis of  By: Marland Mcalpine    . DIABETES MELLITUS, TYPE II 07/16/2007   Qualifier: Diagnosis of  By: Marca Ancona RMA, Lucy    . Diastolic congestive heart failure ([place]) 09/2017  . DIVERTICULOSIS, COLON 05/30/2008   Qualifier: Diagnosis of  By: Marland Mcalpine    . Dyspnea   . Glaucoma   . HYPERLIPIDEMIA 07/16/2007   Qualifier: Diagnosis of  By: Reatha Armour, Lucy    . Hypertension   . Normocytic anemia     Past Surgical History:  Procedure Laterality Date  . ABDOMINAL HYSTERECTOMY      Current Medications: Outpatient Medications Prior to Visit  Medication Sig Dispense Refill  . acetaminophen (TYLENOL) 500 MG tablet Take 500 mg by mouth every 6 (six) hours as needed.    . ALPRAZolam (XANAX) 0.25 MG tablet Take 0.25 mg by mouth daily as needed.  0  . amLODipine (NORVASC) 10 MG tablet Take 10 mg by mouth daily.    Marland Kitchen aspirin 81 MG tablet Take 81 mg by mouth daily.    . cholecalciferol (VITAMIN D) 1000 UNITS tablet Take 1,000 Units by mouth 2 (two) times daily.    Marland Kitchen  cloNIDine (CATAPRES - DOSED IN MG/24 HR) 0.3 mg/24hr patch Place 1 patch (0.3 mg total) onto the skin every Wednesday at 6 PM. 4 patch 12  . furosemide (LASIX) 40 MG tablet Take 1 tablet (40 mg total) by mouth daily. 30 tablet 0  . hydrALAZINE (APRESOLINE) 100 MG tablet Take 1 tablet (100 mg total) by mouth every 6 (six) hours. (Patient taking differently: Take 100 mg by mouth 3 (three) times daily. ) 120 tablet 0  . spironolactone (ALDACTONE) 25 MG tablet Take 25 mg by mouth daily.    . vitamin B-12 (CYANOCOBALAMIN) 500 MCG tablet Take 500 mcg by mouth daily.     No facility-administered medications prior to visit.      Allergies:   Codeine;  Penicillins; and Wellbutrin [bupropion]   Social History   Social History  . Marital status: Widowed    Spouse name: N/A  . Number of children: N/A  . Years of education: N/A   Social History Main Topics  . Smoking status: Former Research scientist (life sciences)  . Smokeless tobacco: Never Used  . Alcohol use No  . Drug use: No  . Sexual activity: Not Asked   Other Topics Concern  . None   Social History Narrative   Widowed.  No children.  Has a brother.       Family History:  The patient's family history includes CAD in her brother; Diabetes Mellitus II in her unknown relative; Heart disease in her father; Pancreatic cancer in her mother.   ROS:   Please see the history of present illness.    ROS All other systems reviewed and are negative.   PHYSICAL EXAM:   VS:  BP (!) 156/60   Pulse 73   Resp 16   Ht 5\' 8"  (1.727 m)   Wt 149 lb (67.6 kg)   SpO2 98%   BMI 22.66 kg/m    GEN: Well nourished, well developed, in no acute distress  HEENT: normal  Neck: no JVD, carotid bruits, or masses Cardiac: RRR; no murmurs, rubs, or gallops,no edema  Respiratory:  clear to auscultation bilaterally, normal work of breathing GI: soft, nontender, nondistended, + BS MS: no deformity or atrophy  Skin: warm and dry, no rash Neuro:  Alert and Oriented x 3, Strength and sensation are intact Psych: euthymic mood, full affect  Wt Readings from Last 3 Encounters:  10/13/17 149 lb (67.6 kg)  10/03/17 143 lb 4.8 oz (65 kg)  12/26/16 170 lb (77.1 kg)      Studies/Labs Reviewed:   EKG:  EKG is not ordered today.    Recent Labs: 09/30/2017: B Natriuretic Peptide 2,068.0 10/01/2017: Hemoglobin 9.5; Magnesium 1.7; Platelets 357 10/03/2017: BUN 30; Creatinine, Ser 1.61; Potassium 3.9; Sodium 138   Lipid Panel    Component Value Date/Time   CHOL 146 02/22/2015 1625   TRIG 98 02/22/2015 1625   HDL 30 (L) 02/22/2015 1625   CHOLHDL 4.9 02/22/2015 1625   VLDL 20 02/22/2015 1625   LDLCALC 96 02/22/2015 1625     Additional studies/ records that were reviewed today include:   Echo 10/01/2017  LV EF: 55% -   60%  Study Conclusions  - Left ventricle: The cavity size was normal. Wall thickness was   increased in a pattern of mild LVH. Systolic function was normal.   The estimated ejection fraction was in the range of 55% to 60%.   Wall motion was normal; there were no regional wall motion   abnormalities. Doppler parameters are  consistent with   pseudonormal left ventricular relaxation (grade 2) diastolic   dysfunction. The E/e&' ratio is >15, suggesting elevated LV   Filling pressure. - Aortic valve: Structurally normal valve. Trileaflet. Mean   gradient (S): 7 mm Hg. - Mitral valve: Mildly thickened leaflets . There was moderate   regurgitation. - Left atrium: Severely dilated. - Right ventricle: The cavity size was mildly dilated. - Right atrium: The atrium was mildly dilated. - Inferior vena cava: The vessel was normal in size. The   respirophasic diameter changes were in the normal range (>= 50%),   consistent with normal central venous pressure.  Impressions:  - Compared to a prior study in 2016, there are few changes. There   appears to be Grade 2 DD with elevated LV filling pressure. The   LA is severely dilated.   ASSESSMENT:    1. Chronic diastolic heart failure ([place])   2. Encounter for long-term (current) use of medications   3. Tachycardia-bradycardia syndrome ([place])   4. Essential hypertension   5. Hyperlipidemia, unspecified hyperlipidemia type   6. Controlled type 2 diabetes mellitus without complication, without long-term current use of insulin (H[facility])      PLAN:  In order of problems listed above:  1. Chronic diastolic heart failure: Appears to be euvolemic on today's physical exam. We'll continue the current dose of diuretic, obtain basic metabolic panel.  2. Possible tachybradycardia syndrome: Plan to obtain outpatient event monitor. She had significant  bradycardia with prolonged sinus pause during recent admission, beta blocker was discontinued, this was followed by AVNRT and junctional tachycardia afterward. We will avoid AV nodal blocking agent for now.  3. Hypertension: Blood pressure elevated today, although patient says her blood pressure usually ranges in the 130s. I wanted to add additional blood pressure medication today, she is hesitant to do so. We will eventually agreed for her to keep a blood pressure diary and if her blood pressure continued to be elevated, I plan to add additional blood pressure medication today in the next office visit. Again will avoid any AV nodal blocking agent.  4. Hyperlipidemia: She is not on statin medication, I have not seen any lipid lab work in [place] since 2016, we'll defer to primary care provider to obtain annual lipid panel.  5. DM 2: Her last hemoglobin A1c was on 02/20/2015, it was 7.7 at the time. She is no longer on any antihyperglycemic medication, this will need to be followed closely by primary care provider.    Medication Adjustments/Labs and Tests Ordered: Current medicines are reviewed at length with the patient today.  Concerns regarding medicines are outlined above.  Medication changes, Labs and Tests ordered today are listed in the Patient Instructions below. Patient Instructions  Medication Instructions:   No changes  Labwork:   BMET today  Testing/Procedures:  none  Follow-Up:   Oct 22 2017 - 11:30am -  Appt for heart monitor (placement)  [address]  (3rd floor - [place] office)  Dec 19 - 9:00am -  Appt with Almyra Deforest PA [address]  (2nd floor - [facility] office)    If you need a refill on your cardiac medications before your next appointment, please call your pharmacy.      Hilbert Corrigan, [place]  10/13/2017 9:27 PM    [address],  [place], [place]  [address] Phone: [phone]; Fax: [phone]   

## 2017-10-13 NOTE — Patient Instructions (Addendum)
Medication Instructions:   No changes  Labwork:   BMET today  Testing/Procedures:  none  Follow-Up:   Oct 22 2017 - 11:30am -  Appt for heart monitor (placement)  [address]  (3rd floor - [place] office)  Dec 19 - 9:00am -  Appt with Almyra Deforest PA [address]  (2nd floor - [facility] office)    If you need a refill on your cardiac medications before your next appointment, please call your pharmacy.

## 2017-10-14 ENCOUNTER — Telehealth: Payer: Self-pay | Admitting: *Deleted

## 2017-10-14 DIAGNOSIS — Z79899 Other long term (current) drug therapy: Secondary | ICD-10-CM

## 2017-10-14 LAB — BASIC METABOLIC PANEL
BUN/Creatinine Ratio: 31 — ABNORMAL HIGH (ref 12–28)
BUN: 62 mg/dL — ABNORMAL HIGH (ref 8–27)
CO2: 19 mmol/L — ABNORMAL LOW (ref 20–29)
Calcium: 10.1 mg/dL (ref 8.7–10.3)
Chloride: 105 mmol/L (ref 96–106)
Creatinine, Ser: 1.98 mg/dL — ABNORMAL HIGH (ref 0.57–1.00)
GFR calc Af Amer: 27 mL/min/{1.73_m2} — ABNORMAL LOW (ref >59)
GFR calc non Af Amer: 23 mL/min/{1.73_m2} — ABNORMAL LOW (ref >59)
Glucose: 141 mg/dL — ABNORMAL HIGH (ref 65–99)
Potassium: 4.8 mmol/L (ref 3.5–5.2)
Sodium: 142 mmol/L (ref 134–144)

## 2017-10-14 MED ORDER — FUROSEMIDE 20 MG PO TABS: 20.0000 mg | ORAL_TABLET | Freq: Every day | ORAL | 3 refills | Status: DC

## 2017-10-14 NOTE — Telephone Encounter (Signed)
Results and recommendations discussed with patient, who verbalized understanding and thanks.  

## 2017-10-14 NOTE — Telephone Encounter (Signed)
-----   Message from [place], [place] sent at 10/14/2017 12:54 PM EDT ----- Kidney function worsened, concerning for dehydration on the current dose of lasix. Recommend decrease lasix to 20mg  daily with instruction to take additional 20mg  lasix on an as-needed basis for worsening shortness of breath. Recheck BMET in 1 week

## 2017-10-20 DIAGNOSIS — Z79899 Other long term (current) drug therapy: Secondary | ICD-10-CM | POA: Diagnosis not present

## 2017-10-21 ENCOUNTER — Ambulatory Visit: Payer: Medicare HMO | Admitting: Physician Assistant

## 2017-10-21 LAB — BASIC METABOLIC PANEL
BUN/Creatinine Ratio: 24 (ref 12–28)
BUN: 44 mg/dL — ABNORMAL HIGH (ref 8–27)
CO2: 20 mmol/L (ref 20–29)
Calcium: 9.7 mg/dL (ref 8.7–10.3)
Chloride: 102 mmol/L (ref 96–106)
Creatinine, Ser: 1.85 mg/dL — ABNORMAL HIGH (ref 0.57–1.00)
GFR calc Af Amer: 29 mL/min/{1.73_m2} — ABNORMAL LOW (ref >59)
GFR calc non Af Amer: 25 mL/min/{1.73_m2} — ABNORMAL LOW (ref >59)
Glucose: 149 mg/dL — ABNORMAL HIGH (ref 65–99)
Potassium: 4.9 mmol/L (ref 3.5–5.2)
Sodium: 140 mmol/L (ref 134–144)

## 2017-10-22 ENCOUNTER — Ambulatory Visit (INDEPENDENT_AMBULATORY_CARE_PROVIDER_SITE_OTHER): Payer: Medicare HMO

## 2017-10-22 DIAGNOSIS — R001 Bradycardia, unspecified: Secondary | ICD-10-CM | POA: Diagnosis not present

## 2017-10-22 DIAGNOSIS — I471 Supraventricular tachycardia: Secondary | ICD-10-CM

## 2017-10-22 NOTE — Telephone Encounter (Signed)
After decreasing lasix dose, kidney function slowly improving. Potassium stable.

## 2017-10-27 DIAGNOSIS — H26493 Other secondary cataract, bilateral: Secondary | ICD-10-CM | POA: Diagnosis not present

## 2017-10-27 DIAGNOSIS — E119 Type 2 diabetes mellitus without complications: Secondary | ICD-10-CM | POA: Diagnosis not present

## 2017-10-27 DIAGNOSIS — H2513 Age-related nuclear cataract, bilateral: Secondary | ICD-10-CM | POA: Diagnosis not present

## 2017-10-27 DIAGNOSIS — H40032 Anatomical narrow angle, left eye: Secondary | ICD-10-CM | POA: Diagnosis not present

## 2017-10-27 DIAGNOSIS — H2512 Age-related nuclear cataract, left eye: Secondary | ICD-10-CM | POA: Diagnosis not present

## 2017-10-27 DIAGNOSIS — H02839 Dermatochalasis of unspecified eye, unspecified eyelid: Secondary | ICD-10-CM | POA: Diagnosis not present

## 2017-11-03 DIAGNOSIS — H524 Presbyopia: Secondary | ICD-10-CM | POA: Diagnosis not present

## 2017-11-03 DIAGNOSIS — H2513 Age-related nuclear cataract, bilateral: Secondary | ICD-10-CM | POA: Diagnosis not present

## 2017-11-03 DIAGNOSIS — E119 Type 2 diabetes mellitus without complications: Secondary | ICD-10-CM | POA: Diagnosis not present

## 2017-11-16 DIAGNOSIS — H2512 Age-related nuclear cataract, left eye: Secondary | ICD-10-CM | POA: Diagnosis not present

## 2017-11-20 ENCOUNTER — Emergency Department (HOSPITAL_COMMUNITY): Payer: Medicare HMO

## 2017-11-20 ENCOUNTER — Observation Stay (HOSPITAL_COMMUNITY)
Admission: EM | Admit: 2017-11-20 | Discharge: 2017-11-21 | Disposition: A | Discharge status: Home / Self Care | Payer: Medicare HMO | Attending: Internal Medicine | Admitting: Internal Medicine

## 2017-11-20 ENCOUNTER — Encounter (HOSPITAL_COMMUNITY): Payer: Self-pay | Admitting: Neurology

## 2017-11-20 DIAGNOSIS — Z79899 Other long term (current) drug therapy: Secondary | ICD-10-CM | POA: Insufficient documentation

## 2017-11-20 DIAGNOSIS — Z7982 Long term (current) use of aspirin: Secondary | ICD-10-CM | POA: Insufficient documentation

## 2017-11-20 DIAGNOSIS — I10 Essential (primary) hypertension: Secondary | ICD-10-CM | POA: Diagnosis present

## 2017-11-20 DIAGNOSIS — E118 Type 2 diabetes mellitus with unspecified complications: Secondary | ICD-10-CM | POA: Diagnosis present

## 2017-11-20 DIAGNOSIS — IMO0002 Reserved for concepts with insufficient information to code with codable children: Secondary | ICD-10-CM | POA: Diagnosis present

## 2017-11-20 DIAGNOSIS — R079 Chest pain, unspecified: Secondary | ICD-10-CM

## 2017-11-20 DIAGNOSIS — I110 Hypertensive heart disease with heart failure: Secondary | ICD-10-CM | POA: Insufficient documentation

## 2017-11-20 DIAGNOSIS — E1165 Type 2 diabetes mellitus with hyperglycemia: Secondary | ICD-10-CM | POA: Diagnosis not present

## 2017-11-20 DIAGNOSIS — H409 Unspecified glaucoma: Secondary | ICD-10-CM | POA: Diagnosis present

## 2017-11-20 DIAGNOSIS — I2510 Atherosclerotic heart disease of native coronary artery without angina pectoris: Secondary | ICD-10-CM | POA: Diagnosis present

## 2017-11-20 DIAGNOSIS — I471 Supraventricular tachycardia: Secondary | ICD-10-CM | POA: Diagnosis not present

## 2017-11-20 DIAGNOSIS — I5032 Chronic diastolic (congestive) heart failure: Secondary | ICD-10-CM | POA: Insufficient documentation

## 2017-11-20 DIAGNOSIS — D509 Iron deficiency anemia, unspecified: Secondary | ICD-10-CM | POA: Diagnosis present

## 2017-11-20 DIAGNOSIS — R000 Tachycardia, unspecified: Secondary | ICD-10-CM | POA: Diagnosis not present

## 2017-11-20 DIAGNOSIS — I5030 Unspecified diastolic (congestive) heart failure: Secondary | ICD-10-CM | POA: Diagnosis present

## 2017-11-20 LAB — LIPID PANEL
Cholesterol: 153 mg/dL (ref 0–200)
HDL: 40 mg/dL — ABNORMAL LOW (ref >40)
LDL Cholesterol: 100 mg/dL — ABNORMAL HIGH (ref 0–99)
Total CHOL/HDL Ratio: 3.8 RATIO
Triglycerides: 66 mg/dL (ref <150)
VLDL: 13 mg/dL (ref 0–40)

## 2017-11-20 LAB — CBC WITH DIFFERENTIAL/PLATELET
Basophils Absolute: 0 10*3/uL (ref 0.0–0.1)
Basophils Relative: 0 %
Eosinophils Absolute: 0 10*3/uL (ref 0.0–0.7)
Eosinophils Relative: 0 %
HCT: 33.2 % — ABNORMAL LOW (ref 36.0–46.0)
Hemoglobin: 10.7 g/dL — ABNORMAL LOW (ref 12.0–15.0)
Lymphocytes Relative: 12 %
Lymphs Abs: 1 10*3/uL (ref 0.7–4.0)
MCH: 27 pg (ref 26.0–34.0)
MCHC: 32.2 g/dL (ref 30.0–36.0)
MCV: 83.6 fL (ref 78.0–100.0)
Monocytes Absolute: 0.5 10*3/uL (ref 0.1–1.0)
Monocytes Relative: 6 %
Neutro Abs: 6.8 10*3/uL (ref 1.7–7.7)
Neutrophils Relative %: 82 %
Platelets: 406 10*3/uL — ABNORMAL HIGH (ref 150–400)
RBC: 3.97 MIL/uL (ref 3.87–5.11)
RDW: 15.2 % (ref 11.5–15.5)
WBC: 8.4 10*3/uL (ref 4.0–10.5)

## 2017-11-20 LAB — COMPREHENSIVE METABOLIC PANEL
ALT: 10 U/L — ABNORMAL LOW (ref 14–54)
AST: 20 U/L (ref 15–41)
Albumin: 3.9 g/dL (ref 3.5–5.0)
Alkaline Phosphatase: 40 U/L (ref 38–126)
Anion gap: 13 (ref 5–15)
BUN: 24 mg/dL — ABNORMAL HIGH (ref 6–20)
CO2: 23 mmol/L (ref 22–32)
Calcium: 9.1 mg/dL (ref 8.9–10.3)
Chloride: 101 mmol/L (ref 101–111)
Creatinine, Ser: 1.48 mg/dL — ABNORMAL HIGH (ref 0.44–1.00)
GFR calc Af Amer: 37 mL/min — ABNORMAL LOW (ref >60)
GFR calc non Af Amer: 32 mL/min — ABNORMAL LOW (ref >60)
Glucose, Bld: 209 mg/dL — ABNORMAL HIGH (ref 65–99)
Potassium: 3.4 mmol/L — ABNORMAL LOW (ref 3.5–5.1)
Sodium: 137 mmol/L (ref 135–145)
Total Bilirubin: 0.3 mg/dL (ref 0.3–1.2)
Total Protein: 7.6 g/dL (ref 6.5–8.1)

## 2017-11-20 LAB — MAGNESIUM: Magnesium: 2.2 mg/dL (ref 1.7–2.4)

## 2017-11-20 LAB — TROPONIN I
Troponin I: <0.03 ng/mL (ref <0.03)
Troponin I: 0.04 ng/mL (ref <0.03)
Troponin I: 0.04 ng/mL (ref <0.03)
Troponin I: 0.04 ng/mL (ref <0.03)

## 2017-11-20 LAB — PROTIME-INR
INR: 1.03
Prothrombin Time: 13.4 seconds (ref 11.4–15.2)

## 2017-11-20 LAB — BRAIN NATRIURETIC PEPTIDE: B Natriuretic Peptide: 146.9 pg/mL — ABNORMAL HIGH (ref 0.0–100.0)

## 2017-11-20 LAB — HEMOGLOBIN A1C
Hgb A1c MFr Bld: 7.2 % — ABNORMAL HIGH (ref 4.8–5.6)
Mean Plasma Glucose: 159.94 mg/dL

## 2017-11-20 LAB — GLUCOSE, CAPILLARY
Glucose-Capillary: 101 mg/dL — ABNORMAL HIGH (ref 65–99)
Glucose-Capillary: 183 mg/dL — ABNORMAL HIGH (ref 65–99)

## 2017-11-20 MED ORDER — ASPIRIN 81 MG PO CHEW: 324.0000 mg | CHEWABLE_TABLET | Freq: Once | ORAL | Status: DC

## 2017-11-20 MED ORDER — ENOXAPARIN SODIUM 30 MG/0.3ML ~~LOC~~ SOLN
30.0000 mg | SUBCUTANEOUS | Status: DC
  Administered 2017-11-20: 30 mg via SUBCUTANEOUS
  Filled 2017-11-20: qty 0.3

## 2017-11-20 MED ORDER — ONDANSETRON HCL 4 MG/2ML IJ SOLN: 4.0000 mg | Freq: Four times a day (QID) | INTRAMUSCULAR | Status: DC | PRN

## 2017-11-20 MED ORDER — FUROSEMIDE 20 MG PO TABS
20.0000 mg | ORAL_TABLET | Freq: Every day | ORAL | Status: DC
  Administered 2017-11-21: 20 mg via ORAL
  Filled 2017-11-20: qty 1

## 2017-11-20 MED ORDER — CLONIDINE HCL 0.3 MG/24HR TD PTWK: 0.3000 mg | MEDICATED_PATCH | TRANSDERMAL | Status: DC

## 2017-11-20 MED ORDER — INSULIN ASPART 100 UNIT/ML ~~LOC~~ SOLN: 0.0000 [IU] | Freq: Three times a day (TID) | SUBCUTANEOUS | Status: DC

## 2017-11-20 MED ORDER — GI COCKTAIL ~~LOC~~: 30.0000 mL | Freq: Four times a day (QID) | ORAL | Status: DC | PRN

## 2017-11-20 MED ORDER — ALPRAZOLAM 0.25 MG PO TABS: 0.2500 mg | ORAL_TABLET | Freq: Two times a day (BID) | ORAL | Status: DC | PRN

## 2017-11-20 MED ORDER — SPIRONOLACTONE 25 MG PO TABS: 25.0000 mg | ORAL_TABLET | Freq: Every day | ORAL | Status: DC

## 2017-11-20 MED ORDER — MORPHINE SULFATE (PF) 4 MG/ML IV SOLN: 1.0000 mg | INTRAVENOUS | Status: DC | PRN

## 2017-11-20 MED ORDER — VITAMIN D 1000 UNITS PO TABS
1000.0000 [IU] | ORAL_TABLET | Freq: Two times a day (BID) | ORAL | Status: DC
  Administered 2017-11-20 – 2017-11-21 (×2): 1000 [IU] via ORAL
  Filled 2017-11-20 (×2): qty 1

## 2017-11-20 MED ORDER — ACETAMINOPHEN 325 MG PO TABS: 650.0000 mg | ORAL_TABLET | ORAL | Status: DC | PRN

## 2017-11-20 MED ORDER — AMLODIPINE BESYLATE 5 MG PO TABS
10.0000 mg | ORAL_TABLET | Freq: Every day | ORAL | Status: DC
  Administered 2017-11-21: 10 mg via ORAL
  Filled 2017-11-20: qty 2

## 2017-11-20 MED ORDER — ASPIRIN EC 81 MG PO TBEC
81.0000 mg | DELAYED_RELEASE_TABLET | Freq: Every day | ORAL | Status: DC
  Administered 2017-11-21: 81 mg via ORAL
  Filled 2017-11-20: qty 1

## 2017-11-20 MED ORDER — HYDRALAZINE HCL 50 MG PO TABS
100.0000 mg | ORAL_TABLET | Freq: Three times a day (TID) | ORAL | Status: DC
  Administered 2017-11-20 – 2017-11-21 (×3): 100 mg via ORAL
  Filled 2017-11-20 (×3): qty 2

## 2017-11-20 MED ORDER — CYANOCOBALAMIN 500 MCG PO TABS
500.0000 ug | ORAL_TABLET | Freq: Every day | ORAL | Status: DC
  Administered 2017-11-21: 500 ug via ORAL
  Filled 2017-11-20: qty 1

## 2017-11-20 MED ORDER — SODIUM CHLORIDE 0.9 % IV SOLN: INTRAVENOUS | Status: DC

## 2017-11-20 MED ORDER — POTASSIUM CHLORIDE CRYS ER 20 MEQ PO TBCR
20.0000 meq | EXTENDED_RELEASE_TABLET | Freq: Once | ORAL | Status: AC
  Administered 2017-11-20: 20 meq via ORAL
  Filled 2017-11-20: qty 1

## 2017-11-20 NOTE — H&P (Signed)
History and Physical    Krista Rosales PJK:[id number] DOB: [birth year] DOA: 11/20/2017   PCP: Josetta Huddle, MD   Patient coming from:  Home    Chief Complaint: Chest pain   HPI: Krista Rosales is a 83 y.o. female with medical history significant for HTN, HLD, iron deficiency anemia, arthritis, diabetes diet-controlled, glaucoma, recent diagnosis of congestive heart failure, presenting from her PCPs office with significant "chest burning for 1-1/2-week ".  She reports the chest pain starting her back, radiating to the chest, denies any shortness of breath, worse on exertion, nausea or vomiting.  She denies any diaphoresis.  No syncope or presyncope.  She denies any fever, chills or sick contacts.  EKG at the doctor's office showed some T wave changes, for which she  received 2 nitroglycerin and 324 mg of aspirin and was sent to the ER for further evaluation.  The patient has been compliant with her medications, especially with Lasix.  Her weight has been about 4-5 pounds over baseline, at 148.  She denies any lower extremity swelling or calf pain.  ED Course:  BP (!) 177/66 (BP Location: Left Arm)   Pulse 79   Temp 98.2 F (36.8 C) (Oral)   Resp 18   Ht 5\' 7"  (1.702 m)   Wt 68 kg (150 lb)   SpO2 100%   BMI 23.49 kg/m   EKG.  Prior EKG showed concern for new LVH in PAF, but a repeat one here at the ER showed  Junctional tachycardia or SVT w aberancy Artifact T wave abnormality CBG 232 Potassium 3.4, creatinine 1.48, magnesium normal 2.2, BNP 146.9, troponin less 0.03, white count 8.4, hemoglobin 10.7, platelets 106, Chest x-ray NAD Last echo in October 11, shows EF 55-60%, LVH, grade 2 DD, normal systolic.  Review of Systems:  As per HPI otherwise all other systems reviewed and are negative  Past Medical History:  Diagnosis Date  . ANEMIA, IRON DEFICIENCY 05/07/2009   Qualifier: Diagnosis of  By: Loanne Drilling MD, Jacelyn Pi   . ARTHRITIS 05/30/2008   Qualifier: Diagnosis of  By: Marland Mcalpine    . DIABETES MELLITUS, TYPE II 07/16/2007   Qualifier: Diagnosis of  By: Marca Ancona RMA, Lucy    . Diastolic congestive heart failure ([place]) 09/2017  . DIVERTICULOSIS, COLON 05/30/2008   Qualifier: Diagnosis of  By: Marland Mcalpine    . Dyspnea   . Glaucoma   . HYPERLIPIDEMIA 07/16/2007   Qualifier: Diagnosis of  By: Reatha Armour, Lucy    . Hypertension   . Normocytic anemia     Past Surgical History:  Procedure Laterality Date  . ABDOMINAL HYSTERECTOMY      Social History Social History   Socioeconomic History  . Marital status: Widowed    Spouse name: Not on file  . Number of children: Not on file  . Years of education: Not on file  . Highest education level: Not on file  Social Needs  . Financial resource strain: Not on file  . Food insecurity - worry: Not on file  . Food insecurity - inability: Not on file  . Transportation needs - medical: Not on file  . Transportation needs - non-medical: Not on file  Occupational History  . Not on file  Tobacco Use  . Smoking status: Former Research scientist (life sciences)  . Smokeless tobacco: Never Used  Substance and Sexual Activity  . Alcohol use: No  . Drug use: No  . Sexual activity: Not on file  Other  Topics Concern  . Not on file  Social History Narrative   Widowed.  No children.  Has a brother.       Allergies  Allergen Reactions  . Codeine   . Penicillins   . Wellbutrin [Bupropion]     Family History  Problem Relation Age of Onset  . Diabetes Mellitus II Unknown   . Pancreatic cancer Mother   . Heart disease Father   . CAD Brother       Prior to Admission medications   Medication Sig Start Date End Date Taking? Authorizing Provider  acetaminophen (TYLENOL) 500 MG tablet Take 500 mg by mouth every 6 (six) hours as needed.    [provider]  ALPRAZolam Duanne Moron) 0.25 MG tablet Take 0.25 mg by mouth daily as needed. 09/07/17   [provider]  amLODipine (NORVASC) 10 MG tablet Take 10 mg by mouth daily.     [provider]  aspirin 81 MG tablet Take 81 mg by mouth daily.    [provider]  cholecalciferol (VITAMIN D) 1000 UNITS tablet Take 1,000 Units by mouth 2 (two) times daily.    [provider]  cloNIDine (CATAPRES - DOSED IN MG/24 HR) 0.3 mg/24hr patch Place 1 patch (0.3 mg total) onto the skin every Wednesday at 6 PM. 10/07/17   Regalado, Belkys A, MD  furosemide (LASIX) 20 MG tablet Take 1 tablet (20 mg total) by mouth daily. May take extra 20mg  as needed for swelling/shortness of breath. 10/14/17   Almyra Deforest, PA  hydrALAZINE (APRESOLINE) 100 MG tablet Take 1 tablet (100 mg total) by mouth every 6 (six) hours. Patient taking differently: Take 100 mg by mouth 3 (three) times daily.  02/23/15   Elgergawy, Silver Huguenin, MD  spironolactone (ALDACTONE) 25 MG tablet Take 25 mg by mouth daily.    [provider]  vitamin B-12 (CYANOCOBALAMIN) 500 MCG tablet Take 500 mcg by mouth daily.    [provider]    Physical Exam:  Vitals:   11/20/17 1415 11/20/17 1430 11/20/17 1445 11/20/17 1451  BP: (!) 165/70 (!) 166/65 (!) 177/66 (!) 177/66  Pulse: 83 78 76 79  Resp: (!) 28 (!) 22 17 18   Temp:      TempSrc:      SpO2: 100% 100% 100% 100%  Weight:      Height:       Constitutional: NAD, somewhat anxious, but comfortable,  Eyes: PERRL, lids and conjunctivae normal.  Status post cataract surgery ENMT: Mucous membranes are moist, without exudate or lesions  Neck: normal, supple, no masses, no thyromegaly Respiratory: clear to auscultation bilaterally, no wheezing, no crackles. Normal respiratory effort  Cardiovascular: Regular rate and rhythm, 2 out of 6 murmur, rubs or gallops. No extremity edema. 2+ pedal pulses. No carotid bruits.  Abdomen: Soft, non tender, No hepatosplenomegaly. Bowel sounds positive.  Musculoskeletal: no clubbing / cyanosis. Moves all extremities Skin: no jaundice, No lesions.  Neurologic: Sensation intact  Strength equal in all  extremities Psychiatric:   Alert and oriented x 3.  Anxious mood.     Labs on Admission: I have personally reviewed following labs and imaging studies  CBC: Recent Labs  Lab 11/20/17 1214  WBC 8.4  NEUTROABS 6.8  HGB 10.7*  HCT 33.2*  MCV 83.6  PLT 406*    Basic Metabolic Panel: Recent Labs  Lab 11/20/17 1214  NA 137  K 3.4*  CL 101  CO2 23  GLUCOSE 209*  BUN 24*  CREATININE 1.48*  CALCIUM 9.1  MG 2.2    GFR: Estimated Creatinine Clearance: 29 mL/min (A) (by C-G formula based on SCr of 1.48 mg/dL (H)).  Liver Function Tests: Recent Labs  Lab 11/20/17 1214  AST 20  ALT 10*  ALKPHOS 40  BILITOT 0.3  PROT 7.6  ALBUMIN 3.9   No results for input(s): LIPASE, AMYLASE in the last 168 hours. No results for input(s): AMMONIA in the last 168 hours.  Coagulation Profile: Recent Labs  Lab 11/20/17 1214  INR 1.03    Cardiac Enzymes: Recent Labs  Lab 11/20/17 1214  TROPONINI <0.03    BNP (last 3 results) No results for input(s): PROBNP in the last 8760 hours.  HbA1C: No results for input(s): HGBA1C in the last 72 hours.  CBG: No results for input(s): GLUCAP in the last 168 hours.  Lipid Profile: No results for input(s): CHOL, HDL, LDLCALC, TRIG, CHOLHDL, LDLDIRECT in the last 72 hours.  Thyroid Function Tests: No results for input(s): TSH, T4TOTAL, FREET4, T3FREE, THYROIDAB in the last 72 hours.  Anemia Panel: No results for input(s): VITAMINB12, FOLATE, FERRITIN, TIBC, IRON, RETICCTPCT in the last 72 hours.  Urine analysis:    Component Value Date/Time   COLORURINE YELLOW 02/20/2015 [place] 02/20/2015 2211   LABSPEC 1.017 02/20/2015 2211   PHURINE 6.0 02/20/2015 2211   GLUCOSEU 100 (A) 02/20/2015 2211   GLUCOSEU >=1000 10/12/2009 0935   HGBUR NEGATIVE 02/20/2015 2211   BILIRUBINUR NEGATIVE 02/20/2015 2211   KETONESUR NEGATIVE 02/20/2015 2211   PROTEINUR >300 (A) 02/20/2015 2211   UROBILINOGEN 0.2 02/20/2015 2211    NITRITE NEGATIVE 02/20/2015 2211   LEUKOCYTESUR NEGATIVE 02/20/2015 2211    Sepsis Labs: @LABRCNTIP (procalcitonin:4,lacticidven:4) )No results found for this or any previous visit (from the past 240 hour(s)).   Radiological Exams on Admission: Dg Chest Portable 1 View  Result Date: 11/20/2017 CLINICAL DATA:  Chest pain for the past week and a half. EXAM: PORTABLE CHEST 1 VIEW COMPARISON:  Chest x-ray dated September 30, 2017. FINDINGS: Stable cardiomegaly. Normal pulmonary vascularity. No focal consolidation, pleural effusion, or pneumothorax. No acute osseous abnormality. IMPRESSION: Stable cardiomegaly.  No active disease. Electronically Signed   By: Titus Dubin M.D.   On: 11/20/2017 12:39    EKG: Independently reviewed.  Assessment/Plan Active Problems:   Chest pain   Diabetes mellitus type 2 with complications (HCC)   ANEMIA, IRON DEFICIENCY   Essential hypertension   Coronary atherosclerosis   Hypertension   Diabetes type 2, uncontrolled (HCC)   Glaucoma   Diastolic congestive heart failure (HCC)    Chest pain syndrome/known CAD HEART score 4. Troponin negative to date,  Prior EKG showed concern for new LVH in PAF, but a repeat one here at the ER showed  Junctional tachycardia or SVT w aberancy Artifact versus T wave abnormality . CP relieved by nitroglycerin, morphine, aspirin. CXR unrevealing. Last echo in October 11, shows EF 55-60%, LVH, grade 2 DD, normal systolic.  Admit to Telemetry/ Observation Chest pain order set Cycle troponins EKG in am continue ASA, O2 and NTG as needed GI cocktail Check Lipid panel  Hb A1C Patient may benefit from an outpatient from stress test, recommend to follow with Dr. Stanford Breed upon discharge   History of diastolic congestive heart failure, with recent exacerbation admission in October 2018, 2D echo as above, the patient was discharged upon diuresis, in stable condition.BNP 146.9, weight 150 lbs CXR NAD  Continue diuresis, and  home meds. Monitor  closely.  I and O's, weight 2 daily.   Hypertension BP  177/66  Pulse 79   patient is compliant with her meds, she has taken this morning Continue home anti-hypertensive medications in a.m. including clonidine, hydralazine, and Lasix   Chronic kidney disease stage    baseline creatinine 1.2   Current Cr 1.48 Lab Results  Component Value Date   CREATININE 1.48 (H) 11/20/2017   CREATININE 1.85 (H) 10/20/2017   CREATININE 1.98 (H) 10/13/2017  PAtient has been increasing her Lasix dose prn. WIll continue her dose as prescribed  Repeat CMET in am   Anemia of chronic disease Hemoglobin on admission 10.7 . Baseline Hb 9-10 Repeat CBC in am  No transfusion is indicated at this time   Type II Diabetes Current blood sugar level is 204  Lab Results  Component Value Date   HGBA1C 7.7 (H) 02/20/2015  Hgb A1C Hold home oral diabetic medications.   SSI  Hypokalemia, may be due to diuretics. K 3.4 should improve with Lasix adjustment . Mg normal  Oral replenishment 20 Kdur   Repeat CMET in am   DVT prophylaxis:  Lovenox  Code Status:    Full  Family Communication:  Discussed with patient Disposition Plan: Expect patient to be discharged to home after condition improves Consults called:    None  Admission status:  Tele Obs    Sharene Butters, PA-C [facility]   11/20/2017, 3:14 PM

## 2017-11-20 NOTE — ED Provider Notes (Signed)
[facility] EMERGENCY DEPARTMENT Provider Note   CSN: [id number] Arrival date & time: 11/20/17  1209     History   Chief Complaint Chief Complaint  Patient presents with  . Chest Pain    HPI Krista Rosales is a 83 y.o. female.  HPI Patient presents with concern of chest pain. Patient was at outpatient physician's office where she went today due to pain that has been present for at least 1 week. There she was found to have a possible EKG changes, sent here for evaluation. The patient is awake and alert, states that about 1 week ago, perhaps slightly more, she developed a burning type sensation in her chest and back. Since that time symptoms have been persistent, without dyspnea, without syncope, without fever, cough. Notably, the patient was hospitalized several weeks ago, found to have new heart failure. She has been taking all medication as directed including Lasix. In route to the patient received aspirin, nitroglycerin, states that her symptoms have improved, though she has minimal, persistent burning sensation across the anterior chest.  Past Medical History:  Diagnosis Date  . ANEMIA, IRON DEFICIENCY 05/07/2009   Qualifier: Diagnosis of  By: Loanne Drilling MD, Jacelyn Pi   . ARTHRITIS 05/30/2008   Qualifier: Diagnosis of  By: Marland Mcalpine    . DIABETES MELLITUS, TYPE II 07/16/2007   Qualifier: Diagnosis of  By: Marca Ancona RMA, Lucy    . Diastolic congestive heart failure ([place]) 09/2017  . DIVERTICULOSIS, COLON 05/30/2008   Qualifier: Diagnosis of  By: Marland Mcalpine    . Dyspnea   . Glaucoma   . HYPERLIPIDEMIA 07/16/2007   Qualifier: Diagnosis of  By: Reatha Armour, Lucy    . Hypertension   . Normocytic anemia     Patient Active Problem List   Diagnosis Date Noted  . Diastolic congestive heart failure ([place]) 09/30/2017  . Diabetes type 2, uncontrolled ([place])   . Headache   . Hypertensive urgency   . Hypokalemia   . Bradycardia   . Normocytic anemia   .  Chronic diastolic CHF (congestive heart failure) ([place])   . Other emphysema ([place])   . Glaucoma   . Hypertension 02/20/2015  . Malignant hypertension 02/20/2015  . ANEMIA, IRON DEFICIENCY 05/07/2009  . DYSPNEA 05/07/2009  . CONSTIPATION 06/01/2008  . HEMORRHOIDS 05/30/2008  . DIVERTICULOSIS, COLON 05/30/2008  . ARTHRITIS 05/30/2008  . COUGH 02/03/2008  . Diabetes mellitus type 2 with complications ([place]) [id number]/[id number]  . HYPERLIPIDEMIA 07/16/2007  . Essential hypertension 07/16/2007  . Coronary atherosclerosis 07/16/2007  . ALLERGIC RHINITIS 07/16/2007  . OSTEOPOROSIS 07/16/2007    Past Surgical History:  Procedure Laterality Date  . ABDOMINAL HYSTERECTOMY      OB History    No data available       Home Medications    Prior to Admission medications   Medication Sig Start Date End Date Taking? Authorizing Provider  acetaminophen (TYLENOL) 500 MG tablet Take 500 mg by mouth every 6 (six) hours as needed.    [provider]  ALPRAZolam Duanne Moron) 0.25 MG tablet Take 0.25 mg by mouth daily as needed. 09/07/17   [provider]  amLODipine (NORVASC) 10 MG tablet Take 10 mg by mouth daily.    [provider]  aspirin 81 MG tablet Take 81 mg by mouth daily.    [provider]  cholecalciferol (VITAMIN D) 1000 UNITS tablet Take 1,000 Units by mouth 2 (two) times daily.    [provider]  cloNIDine (CATAPRES - DOSED IN MG/24 HR) 0.3 mg/24hr patch Place 1 patch (0.3 mg total) onto the skin every Wednesday at 6 PM. 10/07/17   Regalado, Belkys A, MD  furosemide (LASIX) 20 MG tablet Take 1 tablet (20 mg total) by mouth daily. May take extra 20mg  as needed for swelling/shortness of breath. 10/14/17   Almyra Deforest, PA  hydrALAZINE (APRESOLINE) 100 MG tablet Take 1 tablet (100 mg total) by mouth every 6 (six) hours. Patient taking differently: Take 100 mg by mouth 3 (three) times daily.  02/23/15   Elgergawy, Silver Huguenin, MD  spironolactone (ALDACTONE) 25 MG  tablet Take 25 mg by mouth daily.    [provider]  vitamin B-12 (CYANOCOBALAMIN) 500 MCG tablet Take 500 mcg by mouth daily.    [provider]    Family History Family History  Problem Relation Age of Onset  . Diabetes Mellitus II Unknown   . Pancreatic cancer Mother   . Heart disease Father   . CAD Brother     Social History Social History   Tobacco Use  . Smoking status: Former Research scientist (life sciences)  . Smokeless tobacco: Never Used  Substance Use Topics  . Alcohol use: No  . Drug use: No     Allergies   Codeine; Penicillins; and Wellbutrin [bupropion]   Review of Systems Review of Systems  Constitutional:       Per HPI, otherwise negative  HENT:       Per HPI, otherwise negative  Respiratory:       Per HPI, otherwise negative  Cardiovascular:       Per HPI, otherwise negative  Gastrointestinal: Negative for vomiting.  Endocrine:       Negative aside from HPI  Genitourinary:       Neg aside from HPI   Musculoskeletal:       Per HPI, otherwise negative  Skin: Negative.   Neurological: Negative for syncope.     Physical Exam Updated Vital Signs BP (!) 175/128 (BP Location: Left Arm)   Pulse (!) 107   Temp 98.2 F (36.8 C) (Oral)   Resp 16   Ht 5\' 7"  (1.702 m)   Wt 68 kg (150 lb)   SpO2 100%   BMI 23.49 kg/m   Physical Exam  Constitutional: She is oriented to person, place, and time. She appears well-developed and well-nourished. No distress.  HENT:  Head: Normocephalic and atraumatic.  Eyes: Conjunctivae and EOM are normal.  Cardiovascular: Regular rhythm. Tachycardia present.  Pulmonary/Chest: Effort normal and breath sounds normal. No stridor. No respiratory distress.  Abdominal: She exhibits no distension.  Musculoskeletal: She exhibits no edema.  Neurological: She is alert and oriented to person, place, and time. No cranial nerve deficit.  Skin: Skin is warm and dry.  Psychiatric: She has a normal mood and affect.  Nursing note and  vitals reviewed.    ED Treatments / Results  Labs (all labs ordered are listed, but only abnormal results are displayed) Labs Reviewed  COMPREHENSIVE METABOLIC PANEL - Abnormal; Notable for the following components:      Result Value   Potassium 3.4 (*)    Glucose, Bld 209 (*)    BUN 24 (*)    Creatinine, Ser 1.48 (*)    ALT 10 (*)    GFR calc non Af Amer 32 (*)    GFR calc Af Amer 37 (*)    All other components within normal limits  BRAIN NATRIURETIC PEPTIDE - Abnormal; Notable for the  following components:   B Natriuretic Peptide 146.9 (*)    All other components within normal limits  CBC WITH DIFFERENTIAL/PLATELET - Abnormal; Notable for the following components:   Hemoglobin 10.7 (*)    HCT 33.2 (*)    Platelets 406 (*)    All other components within normal limits  MAGNESIUM  TROPONIN I  PROTIME-INR  CBG MONITORING, ED    EKG  EKG Interpretation  Date/Time:  Friday November 20 2017 12:12:28 EST Ventricular Rate:  107 PR Interval:    QRS Duration: 92 QT Interval:  351 QTC Calculation: 469 R Axis:   -50 Text Interpretation:  Junctional tachycardia or SVT w aberancy Artifact T wave abnormality Abnormal ekg Confirmed by Carmin Muskrat [phone]) on 11/20/2017 12:21:19 PM Also confirmed by Carmin Muskrat ([id number]), editor Philomena Doheny [phone])  on 11/20/2017 12:34:03 PM      EKG from the office notable for similar narrow complex tachycardia, but with T wave depressions in the lateral leads   Radiology Dg Chest Portable 1 View  Result Date: 11/20/2017 CLINICAL DATA:  Chest pain for the past week and a half. EXAM: PORTABLE CHEST 1 VIEW COMPARISON:  Chest x-ray dated September 30, 2017. FINDINGS: Stable cardiomegaly. Normal pulmonary vascularity. No focal consolidation, pleural effusion, or pneumothorax. No acute osseous abnormality. IMPRESSION: Stable cardiomegaly.  No active disease. Electronically Signed   By: Titus Dubin M.D.   On: 11/20/2017 12:39     Procedures Procedures (including critical care time)  Medications Ordered in ED Medications  aspirin chewable tablet 324 mg (not administered)     Initial Impression / Assessment and Plan / ED Course  I have reviewed the triage vital signs and the nursing notes.  Pertinent labs & imaging results that were available during my care of the patient were reviewed by me and considered in my medical decision making (see chart for details).  2:10 PM Patient with minimal complaints. She is now committed by multiple family members. I reviewed the initial findings with her and her offspring. No elevated troponin, and although she has some EKG changes, given her improved condition, there is low suspicion for ongoing coronary ischemia. However, given the patient's risk factors, and her chest pain earlier today, she was admitted for further evaluation and management.  Final Clinical Impressions(s) / ED Diagnoses  Atypical chest pain   Carmin Muskrat, MD 11/20/17 1411

## 2017-11-20 NOTE — ED Triage Notes (Signed)
Per ems- pt comes from doctor's office where she had appointment to evaluate chest burning x 1.5 weeks. CP starts in her back, radiates to her chest. Denies sob, n/v. Concern for new LVH on ekg, paroxysmal afib. Given 2 nitro, 324 aspirin. Denies any chest pressure. Is a x 4. BP 184/78, HR 80, CBG 232, 97% RA.

## 2017-11-21 ENCOUNTER — Other Ambulatory Visit: Payer: Self-pay | Admitting: Physician Assistant

## 2017-11-21 DIAGNOSIS — E118 Type 2 diabetes mellitus with unspecified complications: Secondary | ICD-10-CM

## 2017-11-21 DIAGNOSIS — I5032 Chronic diastolic (congestive) heart failure: Secondary | ICD-10-CM | POA: Diagnosis not present

## 2017-11-21 DIAGNOSIS — I10 Essential (primary) hypertension: Secondary | ICD-10-CM

## 2017-11-21 DIAGNOSIS — I110 Hypertensive heart disease with heart failure: Secondary | ICD-10-CM | POA: Diagnosis not present

## 2017-11-21 DIAGNOSIS — R0789 Other chest pain: Secondary | ICD-10-CM

## 2017-11-21 DIAGNOSIS — R079 Chest pain, unspecified: Secondary | ICD-10-CM

## 2017-11-21 DIAGNOSIS — Z79899 Other long term (current) drug therapy: Secondary | ICD-10-CM | POA: Diagnosis not present

## 2017-11-21 DIAGNOSIS — E1165 Type 2 diabetes mellitus with hyperglycemia: Secondary | ICD-10-CM | POA: Diagnosis not present

## 2017-11-21 DIAGNOSIS — Z7982 Long term (current) use of aspirin: Secondary | ICD-10-CM | POA: Diagnosis not present

## 2017-11-21 LAB — BASIC METABOLIC PANEL
Anion gap: 10 (ref 5–15)
BUN: 25 mg/dL — ABNORMAL HIGH (ref 6–20)
CO2: 23 mmol/L (ref 22–32)
Calcium: 8.8 mg/dL — ABNORMAL LOW (ref 8.9–10.3)
Chloride: 104 mmol/L (ref 101–111)
Creatinine, Ser: 1.51 mg/dL — ABNORMAL HIGH (ref 0.44–1.00)
GFR calc Af Amer: 36 mL/min — ABNORMAL LOW (ref >60)
GFR calc non Af Amer: 31 mL/min — ABNORMAL LOW (ref >60)
Glucose, Bld: 212 mg/dL — ABNORMAL HIGH (ref 65–99)
Potassium: 4 mmol/L (ref 3.5–5.1)
Sodium: 137 mmol/L (ref 135–145)

## 2017-11-21 LAB — CBC
HCT: 31.4 % — ABNORMAL LOW (ref 36.0–46.0)
Hemoglobin: 9.9 g/dL — ABNORMAL LOW (ref 12.0–15.0)
MCH: 26.4 pg (ref 26.0–34.0)
MCHC: 31.5 g/dL (ref 30.0–36.0)
MCV: 83.7 fL (ref 78.0–100.0)
Platelets: 345 10*3/uL (ref 150–400)
RBC: 3.75 MIL/uL — ABNORMAL LOW (ref 3.87–5.11)
RDW: 15.6 % — ABNORMAL HIGH (ref 11.5–15.5)
WBC: 5.8 10*3/uL (ref 4.0–10.5)

## 2017-11-21 LAB — GLUCOSE, CAPILLARY
Glucose-Capillary: 115 mg/dL — ABNORMAL HIGH (ref 65–99)
Glucose-Capillary: 174 mg/dL — ABNORMAL HIGH (ref 65–99)

## 2017-11-21 MED ORDER — SPIRONOLACTONE 25 MG PO TABS: 50.0000 mg | ORAL_TABLET | Freq: Every day | ORAL | Status: DC

## 2017-11-21 MED ORDER — HYDRALAZINE HCL 100 MG PO TABS: 100.0000 mg | ORAL_TABLET | Freq: Three times a day (TID) | ORAL | Status: DC

## 2017-11-21 MED ORDER — CLONIDINE HCL 0.3 MG/24HR TD PTWK: 0.3000 mg | MEDICATED_PATCH | TRANSDERMAL | Status: DC

## 2017-11-21 MED ORDER — SPIRONOLACTONE 25 MG PO TABS: 50.0000 mg | ORAL_TABLET | Freq: Every day | ORAL | 0 refills | Status: DC

## 2017-11-21 NOTE — Progress Notes (Signed)
.  my

## 2017-11-21 NOTE — Plan of Care (Signed)
  Progressing Education: Knowledge of General Education information will improve 11/21/2017 0707 - Progressing by Colonel Bald, RN

## 2017-11-21 NOTE — Discharge Instructions (Signed)

## 2017-11-21 NOTE — Care Management Obs Status (Signed)
[facility] NOTIFICATION   Patient Details  Name: Krista Rosales MRN: [id number] Date of Birth: March 09, 1934   Medicare Observation Status Notification Given:  Yes    Arley Phenix, RN 11/21/2017, 2:36 PM

## 2017-11-21 NOTE — Progress Notes (Signed)
Pt having occasional episodes of missed beats and Type 2 AV block on tele, nonsustained.  HR down to 40s at times, pt asymptomatic.  VSS. Will continue to monitor.  Krista Rosales

## 2017-11-21 NOTE — Discharge Summary (Signed)
Physician Discharge Summary  Krista Rosales DPO:[id number] DOB: 10-08-1934  PCP: Josetta Huddle, MD  Admit date: 11/20/2017 Discharge date: 11/21/2017  Recommendations for Outpatient Follow-up:  1. Dr. Dwaine Deter, PCP in 2 weeks with repeat labs (CBC & BMP). 2. Almyra Deforest PA/Cardiology on 12/09/17 at 9 AM.  Home Health: None Equipment/Devices: None    Discharge Condition: Improved and stable  CODE STATUS: Full  Diet recommendation: Heart healthy & diabetic diet.  Discharge Diagnoses:  Active Problems:   Diabetes mellitus type 2 with complications (HCC)   ANEMIA, IRON DEFICIENCY   Essential hypertension   Coronary atherosclerosis   Hypertension   Diabetes type 2, uncontrolled (HCC)   Glaucoma   Diastolic congestive heart failure (HCC)   Chest pain   Brief Summary: 83 year old female with PMH of HTN, HLD, diet-controlled DM 2, chronic diastolic CHF, iron deficiency anemia, recent hospitalization for acute on chronic diastolic CHF, beta blockers held at that time due to bradycardia that resulted in AVNRT versus junctional tachycardia and discharged on 30 day Holter monitor, sent from PCPs office with complaints of 1.5 week history of burning sensation in the upper and mid back at times radiating to upper front chest. Seen in PCPs office, sublingual NTG 2 without relief and sent to ED for evaluation.  Assessment and plan:  1. Atypical chest pain/back discomfort: Cardiology was consulted. EKG without convincing ischemic changes. Marginally abnormal troponin elevation which is flat and present chronically not indicated above true ACS. Cardiology cleared for discharge and recommended Lexiscan Myoview next week as outpatient. No recurrence of chest pain in the hospital. 2. Essential hypertension: Continue current dose of amlodipine, hydralazine and clonidine patch. Aldactone increased from 25 MG daily to 50 MG daily. Follow BMP closely as outpatient. 3. Diet-controlled DM 2: A1c  7.2. Outpatient follow-up with PCP. Reasonable inpatient control on SSI. 4. Hyperlipidemia: LDL 100. Consider statins as outpatient. 5. Chronic diastolic CHF: Continue prior home dose of Lasix. Aldactone increased. Clinically compensated. 6. Stage III chronic kidney disease: Baseline creatinine may be in the 1.4-1.6 range. At baseline. Follow BMP periodically as outpatient. 7. Anemia of chronic disease: Relatively stable. 8. Hypokalemia: Replaced. Magnesium normal. 9. EKG/telemetry abnormality: Personally reviewed with cardiology and suspect first-degree AV block, occasional Wenckebach's but no high degree AV block.   Consultations:  Cardiology  Procedures:  None   Discharge Instructions  Discharge Instructions    (HEART FAILURE PATIENTS) Call MD:  Anytime you have any of the following symptoms: 1) 3 pound weight gain in 24 hours or 5 pounds in 1 week 2) shortness of breath, with or without a dry hacking cough 3) swelling in the hands, feet or stomach 4) if you have to sleep on extra pillows at night in order to breathe.   Complete by:  As directed    Call MD for:  difficulty breathing, headache or visual disturbances   Complete by:  As directed    Call MD for:  extreme fatigue   Complete by:  As directed    Call MD for:  persistant dizziness or light-headedness   Complete by:  As directed    Call MD for:  severe uncontrolled pain   Complete by:  As directed    Diet - low sodium heart healthy   Complete by:  As directed    Diet Carb Modified   Complete by:  As directed    Increase activity slowly   Complete by:  As directed  Medication List    TAKE these medications   acetaminophen 500 MG tablet Commonly known as:  TYLENOL Take 500 mg by mouth every 6 (six) hours as needed.   ALPRAZolam 0.25 MG tablet Commonly known as:  XANAX Take 0.25 mg by mouth daily as needed.   amLODipine 10 MG tablet Commonly known as:  NORVASC Take 10 mg by mouth daily.   aspirin  81 MG tablet Take 81 mg by mouth daily.   cholecalciferol 1000 units tablet Commonly known as:  VITAMIN D Take 2,000 Units by mouth daily.   cloNIDine 0.3 mg/24hr patch Commonly known as:  CATAPRES - Dosed in mg/24 hr Place 1 patch (0.3 mg total) onto the skin every 7 (seven) days. friday What changed:    when to take this  additional instructions   DUREZOL 0.05 % Emul Generic drug:  Difluprednate Place 1 drop into the left eye 3 (three) times daily. Filled 10-27-17   furosemide 20 MG tablet Commonly known as:  LASIX Take 1 tablet (20 mg total) by mouth daily. May take extra 20mg  as needed for swelling/shortness of breath.   hydrALAZINE 100 MG tablet Commonly known as:  APRESOLINE Take 1 tablet (100 mg total) by mouth 3 (three) times daily.   ketorolac 0.5 % ophthalmic solution Commonly known as:  ACULAR Place 1 drop into the left eye 4 (four) times daily. Filled 10-27-17   spironolactone 25 MG tablet Commonly known as:  ALDACTONE Take 2 tablets (50 mg total) by mouth daily. What changed:  how much to take   VIGAMOX 0.5 % ophthalmic solution Generic drug:  moxifloxacin Place 1 drop into the left eye 4 (four) times daily.   vitamin B-12 500 MCG tablet Commonly known as:  CYANOCOBALAMIN Take 500 mcg by mouth daily.      Follow-up Information    Josetta Huddle, MD. Schedule an appointment as soon as possible for a visit in 2 week(s).   Specialty:  Internal Medicine Why:  To be seen with repeat labs (CBC & BMP). Contact information: [address] [place] [address] [phone]          Allergies  Allergen Reactions  . Wellbutrin [Bupropion]     unknown  . Codeine     hallucinate  . Penicillins Rash      Procedures/Studies: Dg Chest Portable 1 View  Result Date: 11/20/2017 CLINICAL DATA:  Chest pain for the past week and a half. EXAM: PORTABLE CHEST 1 VIEW COMPARISON:  Chest x-ray dated September 30, 2017. FINDINGS: Stable  cardiomegaly. Normal pulmonary vascularity. No focal consolidation, pleural effusion, or pneumothorax. No acute osseous abnormality. IMPRESSION: Stable cardiomegaly.  No active disease. Electronically Signed   By: Titus Dubin M.D.   On: 11/20/2017 12:39      Subjective: Reports approximately 1-1.5 week history of burning sensation in upper and mid back which sometimes radiates to mid upper chest. No dyspnea. No recent long distance travel. Currently without chest pain. Ambulates independently.  Discharge Exam:  Vitals:   11/20/17 2144 11/21/17 0417 11/21/17 1006 11/21/17 1319  BP: (!) 177/59 (!) 174/52 (!) 169/57 (!) 159/61  Pulse: 68 (!) 54  83  Resp:    20  Temp: 98.8 F (37.1 C) 98.8 F (37.1 C)  98.8 F (37.1 C)  TempSrc: Oral Oral  Oral  SpO2: 100% 100%  99%  Weight:  64.7 kg (142 lb 11.2 oz)    Height:        General:  Pt lying comfortably in bed & appears in no obvious distress. Cardiovascular: S1 & S2 heard, RRR, S1/S2 +. No murmurs, rubs, gallops or clicks. No JVD or pedal edema. Telemetry: Mostly SB in the 50s-SR. At times second-degree Mobitz type I heart block noted. Respiratory: Clear to auscultation without wheezing, rhonchi or crackles. No increased work of breathing. Abdominal:  Non distended, non tender & soft. No organomegaly or masses appreciated. Normal bowel sounds heard. CNS: Alert and oriented. No focal deficits. Extremities: no edema, no cyanosis Skin: No rashes noted on back.    The results of significant diagnostics from this hospitalization (including imaging, microbiology, ancillary and laboratory) are listed below for reference.     Labs: CBC: Recent Labs  Lab 11/20/17 1214 11/21/17 0915  WBC 8.4 5.8  NEUTROABS 6.8  --   HGB 10.7* 9.9*  HCT 33.2* 31.4*  MCV 83.6 83.7  PLT 406* 166   Basic Metabolic Panel: Recent Labs  Lab 11/20/17 1214 11/21/17 0915  NA 137 137  K 3.4* 4.0  CL 101 104  CO2 23 23  GLUCOSE 209* 212*  BUN 24*  25*  CREATININE 1.48* 1.51*  CALCIUM 9.1 8.8*  MG 2.2  --    Liver Function Tests: Recent Labs  Lab 11/20/17 1214  AST 20  ALT 10*  ALKPHOS 40  BILITOT 0.3  PROT 7.6  ALBUMIN 3.9   BNP (last 3 results) Recent Labs    09/30/17 1417 11/20/17 1215  BNP 2,068.0* 146.9*   Cardiac Enzymes: Recent Labs  Lab 11/20/17 1214 11/20/17 1444 11/20/17 1830 11/20/17 2128  TROPONINI <0.03 0.04* 0.04* 0.04*   CBG: Recent Labs  Lab 11/20/17 1750 11/20/17 2201 11/21/17 0733 11/21/17 1115  GLUCAP 101* 183* 115* 174*   Hgb A1c Recent Labs    11/20/17 1451  HGBA1C 7.2*   Lipid Profile Recent Labs    11/20/17 1506  CHOL 153  HDL 40*  LDLCALC 100*  TRIG 66  CHOLHDL 3.8   Discussed in detail with patient's niece/cousin at bedside. Updated care and answered questions.    Time coordinating discharge: Over 30 minutes  SIGNED:  Vernell Leep, MD, FACP, [facility]. [facility] Pager [phone]  If 7PM-7AM, please contact night-coverage [email or web address] Password [email or web address] 11/21/2017, 1:54 PM

## 2017-11-21 NOTE — Consult Note (Signed)
Cardiology Consultation:   Patient ID: Krista Rosales; [id number]; 1934/10/19   Admit date: 11/20/2017 Date of Consult: 11/21/2017  Primary Care Provider: Josetta Huddle, MD Primary Cardiologist: Dr. Stanford Breed Primary Electrophysiologist:     Patient Profile:   Krista Rosales is a 83 y.o. female with a hx of chronic diastolic heart failure, mild AS, mild MR. Severe LAE, HTN, HLD, DM2 who is being seen today for the evaluation of chest pain at the request of Dr. Algis Liming.  History of Present Illness:   Krista Rosales is known to this service and was recently hospitalized 09/30/17 with acute on chronic diastolic heart failure and shortness of breath. She was diuresed and discharged with a weight of 143 lbs. Beta blocker was held at that time for bradycardia that resulted in AVNRT vs junctional tachcycardia. EP curbsided and she was discharged on a 30-day holter monitor.   Event monitor showed sinus to sinus bradycardia with rare PACs and PVCs. Plan to avoid AV nodal blocking agents.   She presented for follow up with Krista Rosales on 10/13/17, no medication changes made at that visit.  She presented to [facility] from PCP office with a 1.5 week history of "back warmth" that radiated to her central chest. The burning sensation has been constant for 1.5 weeks with no aggravating or relieving factors and no associated symptoms. She denies palpitations, shortness of breath, dizziness, lower extremity swelling, bleeding, and syncope. He pressures have been uncontrolled at home, which is what prompted her to go to her PCP's office. She was given SL nitro x 2 without relief in her chest or back burning. She is having back warmth now.   Past Medical History:  Diagnosis Date  . ANEMIA, IRON DEFICIENCY 05/07/2009   Qualifier: Diagnosis of  By: Loanne Drilling MD, Jacelyn Pi   . ARTHRITIS 05/30/2008   Qualifier: Diagnosis of  By: Marland Mcalpine    . DIABETES MELLITUS, TYPE II 07/16/2007   Qualifier: Diagnosis of  By:  Marca Ancona RMA, Lucy    . Diastolic congestive heart failure ([place]) 09/2017  . DIVERTICULOSIS, COLON 05/30/2008   Qualifier: Diagnosis of  By: Marland Mcalpine    . Dyspnea   . Glaucoma   . HYPERLIPIDEMIA 07/16/2007   Qualifier: Diagnosis of  By: Reatha Armour, Lucy    . Hypertension   . Normocytic anemia     Past Surgical History:  Procedure Laterality Date  . ABDOMINAL HYSTERECTOMY       Home Medications:  Prior to Admission medications   Medication Sig Start Date End Date Taking? Authorizing Provider  acetaminophen (TYLENOL) 500 MG tablet Take 500 mg by mouth every 6 (six) hours as needed.   Yes [provider]  ALPRAZolam (XANAX) 0.25 MG tablet Take 0.25 mg by mouth daily as needed. 09/07/17  Yes [provider]  amLODipine (NORVASC) 10 MG tablet Take 10 mg by mouth daily.   Yes [provider]  aspirin 81 MG tablet Take 81 mg by mouth daily.   Yes [provider]  cholecalciferol (VITAMIN D) 1000 UNITS tablet Take 2,000 Units by mouth daily.    Yes [provider]  cloNIDine (CATAPRES - DOSED IN MG/24 HR) 0.3 mg/24hr patch Place 1 patch (0.3 mg total) onto the skin every Wednesday at 6 PM. Patient taking differently: Place 0.3 mg onto the skin every 7 (seven) days. friday 10/07/17  Yes Regalado, Belkys A, MD  DUREZOL 0.05 % EMUL Place 1 drop into the left eye 3 (three)  times daily. Filled 10-27-17 10/27/17  Yes [provider]  furosemide (LASIX) 20 MG tablet Take 1 tablet (20 mg total) by mouth daily. May take extra 20mg  as needed for swelling/shortness of breath. 10/14/17  Yes Krista Deforest, PA  hydrALAZINE (APRESOLINE) 100 MG tablet Take 1 tablet (100 mg total) by mouth every 6 (six) hours. Patient taking differently: Take 100 mg by mouth 3 (three) times daily.  02/23/15  Yes Elgergawy, Silver Huguenin, MD  ketorolac (ACULAR) 0.5 % ophthalmic solution Place 1 drop into the left eye 4 (four) times daily. Filled 10-27-17 10/27/17  Yes [provider]  spironolactone (ALDACTONE) 25 MG tablet Take 25 mg by mouth daily.   Yes [provider]  VIGAMOX 0.5 % ophthalmic solution Place 1 drop into the left eye 4 (four) times daily. 10/28/17  Yes [provider]  vitamin B-12 (CYANOCOBALAMIN) 500 MCG tablet Take 500 mcg by mouth daily.   Yes [provider]    Inpatient Medications: Scheduled Meds: . amLODipine  10 mg Oral Daily  . aspirin  324 mg Oral Once  . aspirin EC  81 mg Oral Daily  . cholecalciferol  1,000 Units Oral BID  . [START ON 11/25/2017] cloNIDine  0.3 mg Transdermal Q Wed-1800  . cyanocobalamin  500 mcg Oral Daily  . enoxaparin (LOVENOX) injection  30 mg Subcutaneous Q24H  . furosemide  20 mg Oral Daily  . hydrALAZINE  100 mg Oral TID  . insulin aspart  0-9 Units Subcutaneous TID WC  . spironolactone  25 mg Oral Daily   Continuous Infusions: . sodium chloride     PRN Meds: acetaminophen, ALPRAZolam, gi cocktail, morphine injection, ondansetron (ZOFRAN) IV  Allergies:    Allergies  Allergen Reactions  . Wellbutrin [Bupropion]     unknown  . Codeine     hallucinate  . Penicillins Rash    Social History:   Social History   Socioeconomic History  . Marital status: Widowed    Spouse name: Not on file  . Number of children: Not on file  . Years of education: Not on file  . Highest education level: Not on file  Social Needs  . Financial resource strain: Not on file  . Food insecurity - worry: Not on file  . Food insecurity - inability: Not on file  . Transportation needs - medical: Not on file  . Transportation needs - non-medical: Not on file  Occupational History  . Not on file  Tobacco Use  . Smoking status: Former Research scientist (life sciences)  . Smokeless tobacco: Never Used  Substance and Sexual Activity  . Alcohol use: No  . Drug use: No  . Sexual activity: Not on file  Other Topics Concern  . Not on file  Social History Narrative   Widowed.  No children.  Has a brother.        Family History:    Family History  Problem Relation Age of Onset  . Diabetes Mellitus II Unknown   . Pancreatic cancer Mother   . Heart disease Father   . CAD Brother      ROS:  Please see the history of present illness.  ROS  All other ROS reviewed and negative.     Physical Exam/Data:   Vitals:   11/20/17 1721 11/20/17 1811 11/20/17 2144 11/21/17 0417  BP: (!) 180/68 (!) 175/62 (!) 177/59 (!) 174/52  Pulse: 73  68 (!) 54  Resp: 18     Temp: 98.9 F (37.2 C)  98.8 F (37.1 C) 98.8 F (37.1 C)  TempSrc: Oral  Oral Oral  SpO2: 100%  100% 100%  Weight: 145 lb 14.4 oz (66.2 kg)   142 lb 11.2 oz (64.7 kg)  Height: 5\' 7"  (1.702 m)       Intake/Output Summary (Last 24 hours) at 11/21/2017 0754 Last data filed at 11/21/2017 0400 Gross per 24 hour  Intake -  Output 800 ml  Net -800 ml   Filed Weights   11/20/17 1216 11/20/17 1721 11/21/17 0417  Weight: 150 lb (68 kg) 145 lb 14.4 oz (66.2 kg) 142 lb 11.2 oz (64.7 kg)   Body mass index is 22.35 kg/m.  General:  Well nourished, well developed, in no acute distress HEENT: normal Neck: no JVD Vascular: No carotid bruits Cardiac:  normal S1, S2; RRR; 3/6 systolic murmur Lungs:  clear to auscultation bilaterally, no wheezing, rhonchi or rales  Abd: soft, nontender, no hepatomegaly  Ext: no edema Musculoskeletal:  No deformities, BUE and BLE strength normal and equal Skin: warm and dry  Neuro:  CNs 2-12 intact, no focal abnormalities noted Psych:  Normal affect   EKG:  The EKG was personally reviewed and demonstrates:  Sinus bradycardia with 1st degree AV block Telemetry:  Telemetry was personally reviewed and demonstrates:  Sinus with first degree AV block  Relevant CV Studies:  30 day event monitor 10/22/17 Sinus, sinus bradycardia with rare PACs and PVCs   Echo 10/01/17: Study Conclusions - Left ventricle: The cavity size was normal. Wall thickness was   increased in a pattern of mild LVH. Systolic function  was normal.   The estimated ejection fraction was in the range of 55% to 60%.   Wall motion was normal; there were no regional wall motion   abnormalities. Doppler parameters are consistent with   pseudonormal left ventricular relaxation (grade 2) diastolic   dysfunction. The E/e&' ratio is >15, suggesting elevated LV   Filling pressure. - Aortic valve: Structurally normal valve. Trileaflet. Mean   gradient (S): 7 mm Hg. - Mitral valve: Mildly thickened leaflets . There was moderate   regurgitation. - Left atrium: Severely dilated. - Right ventricle: The cavity size was mildly dilated. - Right atrium: The atrium was mildly dilated. - Inferior vena cava: The vessel was normal in size. The   respirophasic diameter changes were in the normal range (>= 50%),   consistent with normal central venous pressure.  Impressions: - Compared to a prior study in 2016, there are few changes. There   appears to be Grade 2 DD with elevated LV filling pressure. The   LA is severely dilated.  Laboratory Data:  Chemistry Recent Labs  Lab 11/20/17 1214  NA 137  K 3.4*  CL 101  CO2 23  GLUCOSE 209*  BUN 24*  CREATININE 1.48*  CALCIUM 9.1  GFRNONAA 32*  GFRAA 37*  ANIONGAP 13    Recent Labs  Lab 11/20/17 1214  PROT 7.6  ALBUMIN 3.9  AST 20  ALT 10*  ALKPHOS 40  BILITOT 0.3   Hematology Recent Labs  Lab 11/20/17 1214  WBC 8.4  RBC 3.97  HGB 10.7*  HCT 33.2*  MCV 83.6  MCH 27.0  MCHC 32.2  RDW 15.2  PLT 406*   Cardiac Enzymes Recent Labs  Lab 11/20/17 1214 11/20/17 1444 11/20/17 1830 11/20/17 2128  TROPONINI <0.03 0.04* 0.04* 0.04*   No results for input(s): TROPIPOC in the last 168 hours.  BNP Recent Labs  Lab 11/20/17 1215  BNP 146.9*    DDimer No results for input(s): DDIMER in the last 168 hours.  Radiology/Studies:  Dg Chest Portable 1 View  Result Date: 11/20/2017 CLINICAL DATA:  Chest pain for the past week and a half. EXAM: PORTABLE CHEST 1 VIEW  COMPARISON:  Chest x-ray dated September 30, 2017. FINDINGS: Stable cardiomegaly. Normal pulmonary vascularity. No focal consolidation, pleural effusion, or pneumothorax. No acute osseous abnormality. IMPRESSION: Stable cardiomegaly.  No active disease. Electronically Signed   By: Titus Dubin M.D.   On: 11/20/2017 12:39    Assessment and Plan:   1. Chest pain - troponin <0.03 --> 0.04 --> 0.04 --> 0.04 - EKG without signs of acute ischemia, but T wave changes The patient has risk factors for CAD, including HTN, HLD, and DM. However, this chest burning sounds atypical for CAD and her troponins are mild and flat in a pattern more consistent with demand ischemia in the setting of HTN urgency. TIMI score is 4 points and her GRACE score is 117. Would recommend ischemic evaluation. Will discuss with attending the utility of nuclear stress test tomorrow vs heart cath.  2. HTN urgency - pressures not well-controlled in the 762-831D systolic - home medication regimen includes: norvasc 10 mg, clonidine patch 0.3 mg, hydralazine 100 mg TID, and spironolactone 25 mg daily - will increase spironolactone to 50 mg daily - she states that her BP at home has bee running high with "the bottom number 111" despite compliance on her medications - elevated troponin likely related to HTN urgency - consider renal artery dopplers for uncontrolled hypertension  3. DM - A1c 7.2% - continue home meds, follow with PCP  4. HLD - LDL 100, HDL 40, triglycerides 66  5. Chronic diastolic heart failure - she does not appear volume overloaded on exam - continue with home diuretic regimen   For questions or updates, please contact [place] Please consult [email or web address] for contact info under Cardiology/STEMI.   Signed, Ledora Bottcher, PA  11/21/2017 7:54 AM  I have seen and examined the patient along with Ledora Bottcher, PA .  I have reviewed the chart, notes and new data.  I agree with PA's  note.  Key new complaints: symptoms are quite atypical for CAD Key examination changes: no overt hypervolemia Key new findings / data: yesterday ECG shows SVT with distinct retrograde P waves buried in terminal QRS (again AVNRT versus junctional tachycardia). Not particularly fast at 107. Today ECG sinus brady, PACs and blocked PACs, 1st deg AVB. Neither tracing shows convincing ischemia changes. Marginally abnormal troponin elevation is flat and is present chronically, not indicative of true acute coronary sd.  PLAN: Presentation is very atypical for CAD, but she has a very significant risk factor profile. Further risk stratification is indicated, but I believe this can be done as an outpatient. Lexiscan myoview next week. BP is elevated and we are trying to avoid drugs with negative chronotropic effect. Already on high dose amlodipine and hydralazine. Renal function is abnormal, but better than last month and K is low. Increase spironolactone to 50 mg daily and recheck BMet in 2 weeks. She has an appt w Dr. Stanford Breed Dec 19.  Sanda Klein, MD, [place] [phone] 11/21/2017, 9:27 AM

## 2017-11-24 DIAGNOSIS — H524 Presbyopia: Secondary | ICD-10-CM | POA: Diagnosis not present

## 2017-11-24 DIAGNOSIS — E119 Type 2 diabetes mellitus without complications: Secondary | ICD-10-CM | POA: Diagnosis not present

## 2017-11-24 DIAGNOSIS — H2513 Age-related nuclear cataract, bilateral: Secondary | ICD-10-CM | POA: Diagnosis not present

## 2017-12-07 DIAGNOSIS — Z7984 Long term (current) use of oral hypoglycemic drugs: Secondary | ICD-10-CM | POA: Diagnosis not present

## 2017-12-07 DIAGNOSIS — E1142 Type 2 diabetes mellitus with diabetic polyneuropathy: Secondary | ICD-10-CM | POA: Diagnosis not present

## 2017-12-07 DIAGNOSIS — E1121 Type 2 diabetes mellitus with diabetic nephropathy: Secondary | ICD-10-CM | POA: Diagnosis not present

## 2017-12-07 DIAGNOSIS — I509 Heart failure, unspecified: Secondary | ICD-10-CM | POA: Diagnosis not present

## 2017-12-07 DIAGNOSIS — I10 Essential (primary) hypertension: Secondary | ICD-10-CM | POA: Diagnosis not present

## 2017-12-07 DIAGNOSIS — E1165 Type 2 diabetes mellitus with hyperglycemia: Secondary | ICD-10-CM | POA: Diagnosis not present

## 2017-12-07 DIAGNOSIS — J449 Chronic obstructive pulmonary disease, unspecified: Secondary | ICD-10-CM | POA: Diagnosis not present

## 2017-12-07 DIAGNOSIS — F411 Generalized anxiety disorder: Secondary | ICD-10-CM | POA: Diagnosis not present

## 2017-12-09 ENCOUNTER — Ambulatory Visit: Payer: Medicare HMO | Admitting: Physician Assistant

## 2017-12-23 DIAGNOSIS — H348332 Tributary (branch) retinal vein occlusion, bilateral, stable: Secondary | ICD-10-CM | POA: Diagnosis not present

## 2017-12-24 ENCOUNTER — Telehealth (HOSPITAL_COMMUNITY): Payer: Self-pay

## 2017-12-24 NOTE — Telephone Encounter (Signed)
Encounter complete. 

## 2017-12-29 ENCOUNTER — Ambulatory Visit (HOSPITAL_COMMUNITY)
Admission: RE | Admit: 2017-12-29 | Discharge: 2017-12-29 | Disposition: A | Discharge status: Home / Self Care | Payer: Medicare HMO | Source: Ambulatory Visit | Attending: Cardiovascular Disease | Admitting: Cardiovascular Disease

## 2017-12-29 DIAGNOSIS — I110 Hypertensive heart disease with heart failure: Secondary | ICD-10-CM | POA: Diagnosis not present

## 2017-12-29 DIAGNOSIS — I10 Essential (primary) hypertension: Secondary | ICD-10-CM

## 2017-12-29 DIAGNOSIS — I5032 Chronic diastolic (congestive) heart failure: Secondary | ICD-10-CM | POA: Diagnosis not present

## 2017-12-29 DIAGNOSIS — I25119 Atherosclerotic heart disease of native coronary artery with unspecified angina pectoris: Secondary | ICD-10-CM | POA: Diagnosis not present

## 2017-12-29 DIAGNOSIS — Z8249 Family history of ischemic heart disease and other diseases of the circulatory system: Secondary | ICD-10-CM | POA: Insufficient documentation

## 2017-12-29 DIAGNOSIS — I2510 Atherosclerotic heart disease of native coronary artery without angina pectoris: Secondary | ICD-10-CM | POA: Diagnosis not present

## 2017-12-29 DIAGNOSIS — R0602 Shortness of breath: Secondary | ICD-10-CM | POA: Diagnosis present

## 2017-12-29 LAB — MYOCARDIAL PERFUSION IMAGING
LV dias vol: 122 mL (ref 46–106)
LV sys vol: 66 mL
Peak HR: 114 {beats}/min
Rest HR: 90 {beats}/min
SDS: 5
SRS: 1
SSS: 6
TID: 1.27

## 2017-12-29 MED ORDER — REGADENOSON 0.4 MG/5ML IV SOLN
0.4000 mg | Freq: Once | INTRAVENOUS | Status: AC
  Administered 2017-12-29: 0.4 mg via INTRAVENOUS

## 2017-12-29 MED ORDER — TECHNETIUM TC 99M TETROFOSMIN IV KIT
10.4000 | PACK | Freq: Once | INTRAVENOUS | Status: AC | PRN
  Administered 2017-12-29: 10.4 via INTRAVENOUS
  Filled 2017-12-29: qty 11

## 2017-12-29 MED ORDER — TECHNETIUM TC 99M TETROFOSMIN IV KIT
31.5000 | PACK | Freq: Once | INTRAVENOUS | Status: AC | PRN
  Administered 2017-12-29: 31.5 via INTRAVENOUS
  Filled 2017-12-29: qty 32

## 2017-12-30 ENCOUNTER — Ambulatory Visit: Payer: Medicare HMO | Admitting: Physician Assistant

## 2017-12-31 DIAGNOSIS — H43813 Vitreous degeneration, bilateral: Secondary | ICD-10-CM | POA: Diagnosis not present

## 2017-12-31 DIAGNOSIS — E113591 Type 2 diabetes mellitus with proliferative diabetic retinopathy without macular edema, right eye: Secondary | ICD-10-CM | POA: Diagnosis not present

## 2017-12-31 DIAGNOSIS — H35033 Hypertensive retinopathy, bilateral: Secondary | ICD-10-CM | POA: Diagnosis not present

## 2017-12-31 DIAGNOSIS — E113512 Type 2 diabetes mellitus with proliferative diabetic retinopathy with macular edema, left eye: Secondary | ICD-10-CM | POA: Diagnosis not present

## 2018-01-18 DIAGNOSIS — E113591 Type 2 diabetes mellitus with proliferative diabetic retinopathy without macular edema, right eye: Secondary | ICD-10-CM | POA: Diagnosis not present

## 2018-01-18 DIAGNOSIS — E113512 Type 2 diabetes mellitus with proliferative diabetic retinopathy with macular edema, left eye: Secondary | ICD-10-CM | POA: Diagnosis not present

## 2018-01-19 ENCOUNTER — Ambulatory Visit: Payer: Medicare HMO | Admitting: Physician Assistant

## 2018-01-19 ENCOUNTER — Encounter: Payer: Self-pay | Admitting: Physician Assistant

## 2018-01-19 VITALS — BP 158/62 | HR 92 | Ht 69.0 in | Wt 152.2 lb

## 2018-01-19 DIAGNOSIS — E119 Type 2 diabetes mellitus without complications: Secondary | ICD-10-CM

## 2018-01-19 DIAGNOSIS — I5032 Chronic diastolic (congestive) heart failure: Secondary | ICD-10-CM

## 2018-01-19 DIAGNOSIS — I10 Essential (primary) hypertension: Secondary | ICD-10-CM | POA: Diagnosis not present

## 2018-01-19 DIAGNOSIS — Z79899 Other long term (current) drug therapy: Secondary | ICD-10-CM | POA: Diagnosis not present

## 2018-01-19 DIAGNOSIS — E785 Hyperlipidemia, unspecified: Secondary | ICD-10-CM

## 2018-01-19 LAB — BASIC METABOLIC PANEL
BUN/Creatinine Ratio: 21 (ref 12–28)
BUN: 31 mg/dL — ABNORMAL HIGH (ref 8–27)
CO2: 17 mmol/L — ABNORMAL LOW (ref 20–29)
Calcium: 9.4 mg/dL (ref 8.7–10.3)
Chloride: 101 mmol/L (ref 96–106)
Creatinine, Ser: 1.46 mg/dL — ABNORMAL HIGH (ref 0.57–1.00)
GFR calc Af Amer: 39 mL/min/{1.73_m2} — ABNORMAL LOW (ref >59)
GFR calc non Af Amer: 34 mL/min/{1.73_m2} — ABNORMAL LOW (ref >59)
Glucose: 156 mg/dL — ABNORMAL HIGH (ref 65–99)
Potassium: 4.1 mmol/L (ref 3.5–5.2)
Sodium: 138 mmol/L (ref 134–144)

## 2018-01-19 NOTE — Patient Instructions (Signed)
Medication Instructions:  Your physician recommends that you continue on your current medications as directed. Please refer to the Current Medication list given to you today.  Labwork: Your physician recommends that you return for lab work in: Today-BMP  Testing/Procedures: None   Follow-Up: Your physician recommends that you schedule a follow-up appointment in: 4 months with Dr Stanford Breed  Any Other Special Instructions Will Be Listed Below (If Applicable). If you need a refill on your cardiac medications before your next appointment, please call your pharmacy.

## 2018-01-19 NOTE — Progress Notes (Signed)
Cardiology Office Note    Date:  01/20/2018   ID:  Krista Rosales, DOB 1934-04-19, MRN [id number]  PCP:  Josetta Huddle, MD  Cardiologist:  Dr. Stanford Breed   Chief Complaint  Patient presents with  . Follow-up    seen for Dr. Stanford Breed. Recent admission for chest discomfort    History of Present Illness:  Krista Rosales is a 84 y.o. female with PMH of chronic diastolic HF, mild AS, mild MR, severe LAE, HTN, HLD and DM II. She presented to the hospital in Oct 2018 with 4 days history of shortness of breath and lower extremity swelling. She did not have any chest pain. Telemetry also showed short periods of normal sinus rhythm with bradycardia with heart rate into the 40s and sinus pauses up to 3.48 sec. Her beta blocker was held. Echocardiogram obtained on 10/01/2017 showed EF 09-23%, grade 2 diastolic dysfunction, severely dilated left atrium, moderate MR. She underwent IV diuresis, her weight decreased from 154 pounds on admission down to 143 pounds. She was discharged on Lasix 40 mg daily. After holding her beta blocker, she had episodes of AVNRT versus junctional tachycardia, the case was discussed with EP and she was discharged on a 30 day event monitor which did not show any significant arrhythmia.   I last saw the patient on 10/13/2017, she was doing well at the time.  Her blood pressure was elevated at the time, I recommended additional blood pressure medication, however she wished to hold off.  She returned to the hospital on 11/20/2017 was chest pain.  Given the atypical nature of her symptoms, outpatient Myoview was recommended.  Her spironolactone was increased to 50 mg daily.  She was supposed to have a repeat basic metabolic panel, this was not done. Myoview obtained on 12/29/2017 showed EF 46%, although visually LVEF appears to be better than calculated, normal Myoview with no evidence of prior infarct or ischemia.    Patient presents today for cardiology office visit.  She denies any  significant chest pain or shortness of breath.  She still have a 1 out of 6 heart murmur on physical exam.  She says she did not go to the hospital in November for chest pain however she did go because her heart was racing at 120 bpm.  This has not recurred since then.  She also complained of a warm feeling both in her chest and in her back and this resolved spontaneously as well.  It does not seems to be related to exertion and it usually last about 20 minutes before going away by itself.  She says this is not exactly pain, and that she did undergo Myoview for this symptom which came back negative.  Otherwise she has no lower extremity edema, orthopnea or PND.  She did not bring a blood pressure diary with her today, however she says her systolic blood pressure mainly in the 130s at home.  She has been maximized on the clonidine, amlodipine and hydralazine.  Her spironolactone was increased during the last hospitalization.  She will need a basic metabolic panel to monitor for potassium level.  Otherwise I will hold off on increasing her blood pressure medication.   Past Medical History:  Diagnosis Date  . ANEMIA, IRON DEFICIENCY 05/07/2009   Qualifier: Diagnosis of  By: Loanne Drilling MD, Jacelyn Pi   . ARTHRITIS 05/30/2008   Qualifier: Diagnosis of  By: Marland Mcalpine    . DIABETES MELLITUS, TYPE II 07/16/2007   Qualifier: Diagnosis  of  By: Reatha Armour, Lorre Nick    . Diastolic congestive heart failure ([place]) 09/2017  . DIVERTICULOSIS, COLON 05/30/2008   Qualifier: Diagnosis of  By: Marland Mcalpine    . Dyspnea   . Glaucoma   . HYPERLIPIDEMIA 07/16/2007   Qualifier: Diagnosis of  By: Reatha Armour, Lucy    . Hypertension   . Normocytic anemia     Past Surgical History:  Procedure Laterality Date  . ABDOMINAL HYSTERECTOMY      Current Medications: Outpatient Medications Prior to Visit  Medication Sig Dispense Refill  . acetaminophen (TYLENOL) 500 MG tablet Take 500 mg by mouth every 6 (six) hours as  needed.    . ALPRAZolam (XANAX) 0.25 MG tablet Take 0.25 mg by mouth daily as needed.  0  . amLODipine (NORVASC) 10 MG tablet Take 10 mg by mouth daily.    Marland Kitchen aspirin 81 MG tablet Take 81 mg by mouth daily.    . cholecalciferol (VITAMIN D) 1000 UNITS tablet Take 2,000 Units by mouth daily.     . cloNIDine (CATAPRES - DOSED IN MG/24 HR) 0.3 mg/24hr patch Place 1 patch (0.3 mg total) onto the skin every 7 (seven) days. friday    . furosemide (LASIX) 20 MG tablet Take 1 tablet (20 mg total) by mouth daily. May take extra 20mg  as needed for swelling/shortness of breath. 30 tablet 3  . hydrALAZINE (APRESOLINE) 100 MG tablet Take 1 tablet (100 mg total) by mouth 3 (three) times daily.    Marland Kitchen spironolactone (ALDACTONE) 25 MG tablet Take 2 tablets (50 mg total) by mouth daily. 60 tablet 0  . vitamin B-12 (CYANOCOBALAMIN) 500 MCG tablet Take 500 mcg by mouth daily.    . DUREZOL 0.05 % EMUL Place 1 drop into the left eye 3 (three) times daily. Filled 10-27-17  0  . ketorolac (ACULAR) 0.5 % ophthalmic solution Place 1 drop into the left eye 4 (four) times daily. Filled 10-27-17  0  . VIGAMOX 0.5 % ophthalmic solution Place 1 drop into the left eye 4 (four) times daily.  0   No facility-administered medications prior to visit.      Allergies:   Wellbutrin [bupropion]; Codeine; and Penicillins   Social History   Socioeconomic History  . Marital status: Widowed    Spouse name: None  . Number of children: None  . Years of education: None  . Highest education level: None  Social Needs  . Financial resource strain: None  . Food insecurity - worry: None  . Food insecurity - inability: None  . Transportation needs - medical: None  . Transportation needs - non-medical: None  Occupational History  . None  Tobacco Use  . Smoking status: Former Research scientist (life sciences)  . Smokeless tobacco: Never Used  Substance and Sexual Activity  . Alcohol use: No  . Drug use: No  . Sexual activity: None  Other Topics Concern  .  None  Social History Narrative   Widowed.  No children.  Has a brother.       Family History:  The patient's family history includes CAD in her brother; Diabetes Mellitus II in her unknown relative; Heart disease in her father; Pancreatic cancer in her mother.   ROS:   Please see the history of present illness.    ROS All other systems reviewed and are negative.   PHYSICAL EXAM:   VS:  BP (!) 158/62   Pulse 92   Ht 5\' 9"  (1.753 m)   Wt 152  lb 3.2 oz (69 kg)   SpO2 99%   BMI 22.48 kg/m    GEN: Well nourished, well developed, in no acute distress  HEENT: normal  Neck: no JVD, carotid bruits, or masses Cardiac: RRR; no murmurs, rubs, or gallops,no edema  Respiratory:  clear to auscultation bilaterally, normal work of breathing GI: soft, nontender, nondistended, + BS MS: no deformity or atrophy  Skin: warm and dry, no rash Neuro:  Alert and Oriented x 3, Strength and sensation are intact Psych: euthymic mood, full affect  Wt Readings from Last 3 Encounters:  01/19/18 152 lb 3.2 oz (69 kg)  12/29/17 142 lb (64.4 kg)  11/21/17 142 lb 11.2 oz (64.7 kg)      Studies/Labs Reviewed:   EKG:  EKG is not ordered today.    Recent Labs: 11/20/2017: ALT 10; B Natriuretic Peptide 146.9; Magnesium 2.2 11/21/2017: Hemoglobin 9.9; Platelets 345 01/19/2018: BUN 31; Creatinine, Ser 1.46; Potassium 4.1; Sodium 138   Lipid Panel    Component Value Date/Time   CHOL 153 11/20/2017 1506   TRIG 66 11/20/2017 1506   HDL 40 (L) 11/20/2017 1506   CHOLHDL 3.8 11/20/2017 1506   VLDL 13 11/20/2017 1506   LDLCALC 100 (H) 11/20/2017 1506    Additional studies/ records that were reviewed today include:   Myoview 12/29/2017 Study Highlights     Nuclear stress EF: 46%.  The left ventricular ejection fraction is mildly decreased (45-54%).  There was no ST segment deviation noted during stress.  The study is normal.  This is a low risk study.   Normal pharmacologic nuclear stress test  with no evidence for prior infarct or ischemia. LVEF appears visually better than calculated.       ASSESSMENT:    1. Essential hypertension   2. Medication management   3. Chronic diastolic heart failure ([place])   4. Hyperlipidemia, unspecified hyperlipidemia type   5. Controlled type 2 diabetes mellitus without complication, without long-term current use of insulin (HCC)      PLAN:  In order of problems listed above:  1. Chronic diastolic heart failure: Euvolemic on today's exam.  Continue on the current dose of diuretic and spironolactone.  Given the increase in the spironolactone, she will need a basic metabolic panel.   2. Hypertension: Avoid beta-blocker at this point due to history of significant bradycardia.  Her home blood pressure is usually in the 130s at home 140s, blood pressure a little bit elevated today, recommend continue to monitor blood pressure at home and keep a diary.  3. Hyperlipidemia: Will need fasting lipid panel by primary care provider, currently not on any medication.  4. DM 2: Managed by primary care provider.  She is not on any medication for diabetes.    Medication Adjustments/Labs and Tests Ordered: Current medicines are reviewed at length with the patient today.  Concerns regarding medicines are outlined above.  Medication changes, Labs and Tests ordered today are listed in the Patient Instructions below. Patient Instructions  Medication Instructions:  Your physician recommends that you continue on your current medications as directed. Please refer to the Current Medication list given to you today.  Labwork: Your physician recommends that you return for lab work in: Today-BMP  Testing/Procedures: None   Follow-Up: Your physician recommends that you schedule a follow-up appointment in: 4 months with Dr Stanford Breed  Any Other Special Instructions Will Be Listed Below (If Applicable). If you need a refill on your cardiac medications before your  next appointment, please  call your pharmacy.     Hilbert Corrigan, [place]  01/20/2018 10:08 AM    [address], [place], [place]  [address] Phone: [phone]; Fax: [phone]

## 2018-01-20 ENCOUNTER — Encounter: Payer: Self-pay | Admitting: Physician Assistant

## 2018-01-20 NOTE — Progress Notes (Signed)
Kidney function stable, potassium level also stable on spironolactone

## 2018-01-25 DIAGNOSIS — I10 Essential (primary) hypertension: Secondary | ICD-10-CM | POA: Diagnosis not present

## 2018-01-25 DIAGNOSIS — E1121 Type 2 diabetes mellitus with diabetic nephropathy: Secondary | ICD-10-CM | POA: Diagnosis not present

## 2018-01-25 DIAGNOSIS — I509 Heart failure, unspecified: Secondary | ICD-10-CM | POA: Diagnosis not present

## 2018-01-25 DIAGNOSIS — E1142 Type 2 diabetes mellitus with diabetic polyneuropathy: Secondary | ICD-10-CM | POA: Diagnosis not present

## 2018-01-25 DIAGNOSIS — J449 Chronic obstructive pulmonary disease, unspecified: Secondary | ICD-10-CM | POA: Diagnosis not present

## 2018-01-25 DIAGNOSIS — F411 Generalized anxiety disorder: Secondary | ICD-10-CM | POA: Diagnosis not present

## 2018-01-25 DIAGNOSIS — E1165 Type 2 diabetes mellitus with hyperglycemia: Secondary | ICD-10-CM | POA: Diagnosis not present

## 2018-01-28 ENCOUNTER — Telehealth: Payer: Self-pay | Admitting: Physician Assistant

## 2018-01-28 NOTE — Telephone Encounter (Signed)
New Message ° ° ° °Patient is returning call in reference to lab results. Please call.  °

## 2018-01-28 NOTE — Telephone Encounter (Signed)
Patient aware of lab results.

## 2018-02-23 DIAGNOSIS — J069 Acute upper respiratory infection, unspecified: Secondary | ICD-10-CM | POA: Diagnosis not present

## 2018-02-25 DIAGNOSIS — H35033 Hypertensive retinopathy, bilateral: Secondary | ICD-10-CM | POA: Diagnosis not present

## 2018-02-25 DIAGNOSIS — E113512 Type 2 diabetes mellitus with proliferative diabetic retinopathy with macular edema, left eye: Secondary | ICD-10-CM | POA: Diagnosis not present

## 2018-02-25 DIAGNOSIS — H3582 Retinal ischemia: Secondary | ICD-10-CM | POA: Diagnosis not present

## 2018-02-25 DIAGNOSIS — E113591 Type 2 diabetes mellitus with proliferative diabetic retinopathy without macular edema, right eye: Secondary | ICD-10-CM | POA: Diagnosis not present

## 2018-02-26 ENCOUNTER — Other Ambulatory Visit: Payer: Self-pay | Admitting: Physician Assistant

## 2018-02-26 NOTE — Telephone Encounter (Signed)
Please review for refill, Thanks !  

## 2018-02-26 NOTE — Telephone Encounter (Signed)
REFILL 

## 2018-04-01 DIAGNOSIS — E113591 Type 2 diabetes mellitus with proliferative diabetic retinopathy without macular edema, right eye: Secondary | ICD-10-CM | POA: Diagnosis not present

## 2018-04-01 DIAGNOSIS — H3582 Retinal ischemia: Secondary | ICD-10-CM | POA: Diagnosis not present

## 2018-04-01 DIAGNOSIS — H35033 Hypertensive retinopathy, bilateral: Secondary | ICD-10-CM | POA: Diagnosis not present

## 2018-04-01 DIAGNOSIS — E113512 Type 2 diabetes mellitus with proliferative diabetic retinopathy with macular edema, left eye: Secondary | ICD-10-CM | POA: Diagnosis not present

## 2018-04-15 DIAGNOSIS — E119 Type 2 diabetes mellitus without complications: Secondary | ICD-10-CM | POA: Diagnosis not present

## 2018-04-29 DIAGNOSIS — E113591 Type 2 diabetes mellitus with proliferative diabetic retinopathy without macular edema, right eye: Secondary | ICD-10-CM | POA: Diagnosis not present

## 2018-05-12 NOTE — Progress Notes (Signed)
HPI: FU chest pain. Echo October 2018 showed normal LV function, mild left ventricular hypertrophy, moderate diastolic dysfunction, moderate mitral regurgitation, severe left atrial enlargement, mild right atrial and right ventricular enlargement.  Monitor November 2018 showed sinus to sinus bradycardia with rare PAC and PVC.  Nuclear study January 2019 showed ejection fraction 46% and no ischemia or infarction.  Ejection fraction felt better calculated number.  Since last seen patient denies dyspnea, chest pain, palpitations or syncope.  Current Outpatient Medications  Medication Sig Dispense Refill  . acetaminophen (TYLENOL) 500 MG tablet Take 500 mg by mouth every 6 (six) hours as needed.    . ALPRAZolam (XANAX) 0.25 MG tablet Take 0.25 mg by mouth daily as needed.  0  . amLODipine (NORVASC) 10 MG tablet Take 10 mg by mouth daily.    Marland Kitchen aspirin 81 MG tablet Take 81 mg by mouth daily.    . cholecalciferol (VITAMIN D) 1000 UNITS tablet Take 2,000 Units by mouth daily.     . cloNIDine (CATAPRES - DOSED IN MG/24 HR) 0.3 mg/24hr patch Place 1 patch (0.3 mg total) onto the skin every 7 (seven) days. friday    . furosemide (LASIX) 20 MG tablet TAKE 1 TABLET BY MOUTH ONCE DAILY MAY TAKE EXTRA TABLET AS NEEDED FOR SWELLING/SHORTNESS OF BREATH 30 tablet 4  . hydrALAZINE (APRESOLINE) 100 MG tablet Take 1 tablet (100 mg total) by mouth 3 (three) times daily.    Marland Kitchen spironolactone (ALDACTONE) 25 MG tablet Take 2 tablets (50 mg total) by mouth daily. 60 tablet 0  . vitamin B-12 (CYANOCOBALAMIN) 500 MCG tablet Take 500 mcg by mouth daily.     No current facility-administered medications for this visit.      Past Medical History:  Diagnosis Date  . ANEMIA, IRON DEFICIENCY 05/07/2009   Qualifier: Diagnosis of  By: Loanne Drilling MD, Jacelyn Pi   . ARTHRITIS 05/30/2008   Qualifier: Diagnosis of  By: Marland Mcalpine    . DIABETES MELLITUS, TYPE II 07/16/2007   Qualifier: Diagnosis of  By: Marca Ancona RMA, Lucy    .  Diastolic congestive heart failure ([place]) 09/2017  . DIVERTICULOSIS, COLON 05/30/2008   Qualifier: Diagnosis of  By: Marland Mcalpine    . Dyspnea   . Glaucoma   . HYPERLIPIDEMIA 07/16/2007   Qualifier: Diagnosis of  By: Reatha Armour, Lucy    . Hypertension   . Normocytic anemia     Past Surgical History:  Procedure Laterality Date  . ABDOMINAL HYSTERECTOMY      Social History   Socioeconomic History  . Marital status: Widowed    Spouse name: Not on file  . Number of children: Not on file  . Years of education: Not on file  . Highest education level: Not on file  Occupational History  . Not on file  Social Needs  . Financial resource strain: Not on file  . Food insecurity:    Worry: Not on file    Inability: Not on file  . Transportation needs:    Medical: Not on file    Non-medical: Not on file  Tobacco Use  . Smoking status: Former Research scientist (life sciences)  . Smokeless tobacco: Never Used  Substance and Sexual Activity  . Alcohol use: No  . Drug use: No  . Sexual activity: Not on file  Lifestyle  . Physical activity:    Days per week: Not on file    Minutes per session: Not on file  . Stress: Not  on file  Relationships  . Social connections:    Talks on phone: Not on file    Gets together: Not on file    Attends religious service: Not on file    Active member of club or organization: Not on file    Attends meetings of clubs or organizations: Not on file    Relationship status: Not on file  . Intimate partner violence:    Fear of current or ex partner: Not on file    Emotionally abused: Not on file    Physically abused: Not on file    Forced sexual activity: Not on file  Other Topics Concern  . Not on file  Social History Narrative   Widowed.  No children.  Has a brother.      Family History  Problem Relation Age of Onset  . Diabetes Mellitus II Unknown   . Pancreatic cancer Mother   . Heart disease Father   . CAD Brother     ROS: no fevers or chills, productive  cough, hemoptysis, dysphasia, odynophagia, melena, hematochezia, dysuria, hematuria, rash, seizure activity, orthopnea, PND, pedal edema, claudication. Remaining systems are negative.  Physical Exam: Well-developed well-nourished in no acute distress.  Skin is warm and dry.  HEENT is normal.  Neck is supple.  Chest is clear to auscultation with normal expansion.  Cardiovascular exam is regular rate and rhythm.  Abdominal exam nontender or distended. No masses palpated. Extremities show no edema. neuro grossly intact   A/P  1 chest pain-previous symptoms felt to be atypical.  Nuclear study showed no ischemia.  No plans for further ischemia evaluation.  2 moderate mitral regurgitation-she will need follow-up echocardiogram October 2019.  3 hypertension-blood pressure is elevated.  Patient had bradycardia with beta-blockade in the past.  She is on maximum dose amlodipine, clonidine, hydralazine, Lasix and spironolactone.  I will add Cardura 2 mg daily.  Follow blood pressure and adjust regimen as needed.  4 diabetes mellitus-management per primary care.  5 chronic diastolic congestive heart failure-patient is euvolemic.  Continue present dose of diuretic.  Check potassium and renal function.  Continue fluid restriction and low-sodium diet.  Kirk Ruths, MD

## 2018-05-18 ENCOUNTER — Ambulatory Visit: Payer: Medicare HMO | Admitting: Cardiology

## 2018-05-18 ENCOUNTER — Telehealth: Payer: Self-pay | Admitting: *Deleted

## 2018-05-18 ENCOUNTER — Encounter: Payer: Self-pay | Admitting: Cardiology

## 2018-05-18 VITALS — BP 160/60 | HR 80 | Ht 67.0 in | Wt 154.8 lb

## 2018-05-18 DIAGNOSIS — R072 Precordial pain: Secondary | ICD-10-CM | POA: Diagnosis not present

## 2018-05-18 DIAGNOSIS — N289 Disorder of kidney and ureter, unspecified: Secondary | ICD-10-CM

## 2018-05-18 DIAGNOSIS — I340 Nonrheumatic mitral (valve) insufficiency: Secondary | ICD-10-CM

## 2018-05-18 DIAGNOSIS — I10 Essential (primary) hypertension: Secondary | ICD-10-CM

## 2018-05-18 LAB — BASIC METABOLIC PANEL
BUN/Creatinine Ratio: 18 (ref 12–28)
BUN: 32 mg/dL — ABNORMAL HIGH (ref 8–27)
CO2: 21 mmol/L (ref 20–29)
Calcium: 9.7 mg/dL (ref 8.7–10.3)
Chloride: 102 mmol/L (ref 96–106)
Creatinine, Ser: 1.74 mg/dL — ABNORMAL HIGH (ref 0.57–1.00)
GFR calc Af Amer: 31 mL/min/{1.73_m2} — ABNORMAL LOW (ref >59)
GFR calc non Af Amer: 27 mL/min/{1.73_m2} — ABNORMAL LOW (ref >59)
Glucose: 181 mg/dL — ABNORMAL HIGH (ref 65–99)
Potassium: 4 mmol/L (ref 3.5–5.2)
Sodium: 139 mmol/L (ref 134–144)

## 2018-05-18 MED ORDER — DOXAZOSIN MESYLATE 2 MG PO TABS: 2.0000 mg | ORAL_TABLET | Freq: Every day | ORAL | 3 refills | Status: DC

## 2018-05-18 MED ORDER — SPIRONOLACTONE 25 MG PO TABS: 12.5000 mg | ORAL_TABLET | Freq: Every day | ORAL | 0 refills | Status: DC

## 2018-05-18 NOTE — Telephone Encounter (Signed)
-----   Message from Lelon Perla, MD sent at 05/18/2018  4:39 PM EDT ----- Change spironolactone to 25 mg daily; bmet one week Kirk Ruths

## 2018-05-18 NOTE — Patient Instructions (Signed)
Medication Instructions:   START DOXAZOSIN 2 MG ONCE DAILY   Labwork:  Your physician recommends that you HAVE LAB WORK TODAY  Follow-Up:  Your physician wants you to follow-up in: [place] will receive a reminder letter in the mail two months in advance. If you don't receive a letter, please call our office to schedule the follow-up appointment.   If you need a refill on your cardiac medications before your next appointment, please call your pharmacy.

## 2018-05-18 NOTE — Telephone Encounter (Signed)
Spoke with pt, she reports she has only been taking 25 mg of the spironolactone because the instructions on her bottle changed. Advised the patient to take 12.5 mg once daily. Patient voiced understanding and repeated medication change. Lab orders mailed to the pt

## 2018-05-19 ENCOUNTER — Telehealth: Payer: Self-pay | Admitting: Cardiology

## 2018-05-19 ENCOUNTER — Telehealth: Payer: Self-pay | Admitting: *Deleted

## 2018-05-19 MED ORDER — FUROSEMIDE 20 MG PO TABS: 20.0000 mg | ORAL_TABLET | ORAL | 4 refills | Status: DC

## 2018-05-19 NOTE — Telephone Encounter (Signed)
Per dr Stanford Breed, patient instructed to decrease furosemide to 20 mg every other day. Patient voiced understanding.

## 2018-05-19 NOTE — Telephone Encounter (Signed)
Spoke with patient and she wanted confirmation r/e spironolactone dose and directions per conversation on yesterday. Patient advised that she is to take 12.5 mg of spironolactone daily and have lab work done in one week to check BMET. Verbalized understanding of plan.

## 2018-05-19 NOTE — Telephone Encounter (Signed)
-----   Message from Lelon Perla, MD sent at 05/18/2018  4:39 PM EDT ----- Change spironolactone to 25 mg daily; bmet one week Kirk Ruths

## 2018-05-19 NOTE — Telephone Encounter (Signed)
New Message    Patient is calling to clarify what medication that Dr. Stanford Breed wanted her to 1/2. Please call.

## 2018-05-27 DIAGNOSIS — H43813 Vitreous degeneration, bilateral: Secondary | ICD-10-CM | POA: Diagnosis not present

## 2018-05-27 DIAGNOSIS — H35033 Hypertensive retinopathy, bilateral: Secondary | ICD-10-CM | POA: Diagnosis not present

## 2018-05-27 DIAGNOSIS — E113513 Type 2 diabetes mellitus with proliferative diabetic retinopathy with macular edema, bilateral: Secondary | ICD-10-CM | POA: Diagnosis not present

## 2018-05-27 DIAGNOSIS — H3582 Retinal ischemia: Secondary | ICD-10-CM | POA: Diagnosis not present

## 2018-05-27 DIAGNOSIS — E113512 Type 2 diabetes mellitus with proliferative diabetic retinopathy with macular edema, left eye: Secondary | ICD-10-CM | POA: Diagnosis not present

## 2018-06-04 DIAGNOSIS — N289 Disorder of kidney and ureter, unspecified: Secondary | ICD-10-CM | POA: Diagnosis not present

## 2018-06-05 LAB — BASIC METABOLIC PANEL
BUN/Creatinine Ratio: 18 (ref 12–28)
BUN: 33 mg/dL — ABNORMAL HIGH (ref 8–27)
CO2: 21 mmol/L (ref 20–29)
Calcium: 9.2 mg/dL (ref 8.7–10.3)
Chloride: 102 mmol/L (ref 96–106)
Creatinine, Ser: 1.82 mg/dL — ABNORMAL HIGH (ref 0.57–1.00)
GFR calc Af Amer: 30 mL/min/{1.73_m2} — ABNORMAL LOW (ref >59)
GFR calc non Af Amer: 26 mL/min/{1.73_m2} — ABNORMAL LOW (ref >59)
Glucose: 241 mg/dL — ABNORMAL HIGH (ref 65–99)
Potassium: 4.6 mmol/L (ref 3.5–5.2)
Sodium: 139 mmol/L (ref 134–144)

## 2018-07-08 DIAGNOSIS — E113512 Type 2 diabetes mellitus with proliferative diabetic retinopathy with macular edema, left eye: Secondary | ICD-10-CM | POA: Diagnosis not present

## 2018-07-08 DIAGNOSIS — E113513 Type 2 diabetes mellitus with proliferative diabetic retinopathy with macular edema, bilateral: Secondary | ICD-10-CM | POA: Diagnosis not present

## 2018-08-12 DIAGNOSIS — D81818 Other biotin-dependent carboxylase deficiency: Secondary | ICD-10-CM | POA: Diagnosis not present

## 2018-08-12 DIAGNOSIS — I10 Essential (primary) hypertension: Secondary | ICD-10-CM | POA: Diagnosis not present

## 2018-08-12 DIAGNOSIS — Z79899 Other long term (current) drug therapy: Secondary | ICD-10-CM | POA: Diagnosis not present

## 2018-08-12 DIAGNOSIS — Z0001 Encounter for general adult medical examination with abnormal findings: Secondary | ICD-10-CM | POA: Diagnosis not present

## 2018-08-12 DIAGNOSIS — J449 Chronic obstructive pulmonary disease, unspecified: Secondary | ICD-10-CM | POA: Diagnosis not present

## 2018-08-12 DIAGNOSIS — I509 Heart failure, unspecified: Secondary | ICD-10-CM | POA: Diagnosis not present

## 2018-08-12 DIAGNOSIS — E1121 Type 2 diabetes mellitus with diabetic nephropathy: Secondary | ICD-10-CM | POA: Diagnosis not present

## 2018-08-12 DIAGNOSIS — E1142 Type 2 diabetes mellitus with diabetic polyneuropathy: Secondary | ICD-10-CM | POA: Diagnosis not present

## 2018-08-12 DIAGNOSIS — E559 Vitamin D deficiency, unspecified: Secondary | ICD-10-CM | POA: Diagnosis not present

## 2018-08-16 DIAGNOSIS — E113513 Type 2 diabetes mellitus with proliferative diabetic retinopathy with macular edema, bilateral: Secondary | ICD-10-CM | POA: Diagnosis not present

## 2018-08-16 DIAGNOSIS — H35033 Hypertensive retinopathy, bilateral: Secondary | ICD-10-CM | POA: Diagnosis not present

## 2018-08-16 DIAGNOSIS — E113512 Type 2 diabetes mellitus with proliferative diabetic retinopathy with macular edema, left eye: Secondary | ICD-10-CM | POA: Diagnosis not present

## 2018-08-16 DIAGNOSIS — H3582 Retinal ischemia: Secondary | ICD-10-CM | POA: Diagnosis not present

## 2018-08-16 DIAGNOSIS — H43813 Vitreous degeneration, bilateral: Secondary | ICD-10-CM | POA: Diagnosis not present

## 2018-09-27 DIAGNOSIS — E113592 Type 2 diabetes mellitus with proliferative diabetic retinopathy without macular edema, left eye: Secondary | ICD-10-CM | POA: Diagnosis not present

## 2018-09-27 DIAGNOSIS — H35033 Hypertensive retinopathy, bilateral: Secondary | ICD-10-CM | POA: Diagnosis not present

## 2018-09-27 DIAGNOSIS — H3582 Retinal ischemia: Secondary | ICD-10-CM | POA: Diagnosis not present

## 2018-09-27 DIAGNOSIS — H43822 Vitreomacular adhesion, left eye: Secondary | ICD-10-CM | POA: Diagnosis not present

## 2018-09-27 DIAGNOSIS — E113593 Type 2 diabetes mellitus with proliferative diabetic retinopathy without macular edema, bilateral: Secondary | ICD-10-CM | POA: Diagnosis not present

## 2018-10-20 DIAGNOSIS — E1165 Type 2 diabetes mellitus with hyperglycemia: Secondary | ICD-10-CM | POA: Diagnosis not present

## 2018-10-20 DIAGNOSIS — E7849 Other hyperlipidemia: Secondary | ICD-10-CM | POA: Diagnosis not present

## 2018-10-20 DIAGNOSIS — E119 Type 2 diabetes mellitus without complications: Secondary | ICD-10-CM | POA: Diagnosis not present

## 2018-10-20 DIAGNOSIS — D649 Anemia, unspecified: Secondary | ICD-10-CM | POA: Diagnosis not present

## 2018-10-20 DIAGNOSIS — I10 Essential (primary) hypertension: Secondary | ICD-10-CM | POA: Diagnosis not present

## 2018-10-20 DIAGNOSIS — J449 Chronic obstructive pulmonary disease, unspecified: Secondary | ICD-10-CM | POA: Diagnosis not present

## 2018-10-20 DIAGNOSIS — I509 Heart failure, unspecified: Secondary | ICD-10-CM | POA: Diagnosis not present

## 2018-10-20 DIAGNOSIS — E1142 Type 2 diabetes mellitus with diabetic polyneuropathy: Secondary | ICD-10-CM | POA: Diagnosis not present

## 2018-10-22 ENCOUNTER — Telehealth: Payer: Self-pay | Admitting: Cardiology

## 2018-10-22 NOTE — Telephone Encounter (Signed)
Spoke with Tammy @ [facility]. Patient was in PCP office today and BP was elevated - 158/62 and recheck of 154/60. Patient was supposed to start doxazosin at last MD visit in May but has not, per PCP. Tammy states patient will not let anyone adjust her BP meds except Dr. Stanford Breed. Advised I will call patient about her medications.    Patient states she did not start doxazosin d/t side effects. She states her BP is elevated based on what she eats and that she is aging. Advised I would notify MD. She has OV 11/25. Doxazosin removed from med list as patient never started/notified staff.

## 2018-10-23 NOTE — Telephone Encounter (Signed)
Continue to check BP and we will review at next [facility]

## 2018-10-25 ENCOUNTER — Encounter: Payer: Self-pay | Admitting: Cardiology

## 2018-10-25 DIAGNOSIS — E113592 Type 2 diabetes mellitus with proliferative diabetic retinopathy without macular edema, left eye: Secondary | ICD-10-CM | POA: Diagnosis not present

## 2018-11-08 NOTE — Progress Notes (Signed)
HPI: FU chest pain.Echo October 2018 showed normal LV function, mild left ventricular hypertrophy, moderate diastolic dysfunction, moderate mitral regurgitation, severe left atrial enlargement, mild right atrial and right ventricular enlargement.  Monitor November 2018 showed sinus to sinus bradycardia with rare PAC and PVC.  Nuclear study January 2019 showed ejection fraction 46% and no ischemia or infarction.  Ejection fraction felt better calculated number.  Since last seen she has mild dyspnea on exertion but no orthopnea, PND, pedal edema, chest pain or syncope.  Current Outpatient Medications  Medication Sig Dispense Refill  . acetaminophen (TYLENOL) 500 MG tablet Take 500 mg by mouth every 6 (six) hours as needed.    . ALPRAZolam (XANAX) 0.25 MG tablet Take 0.25 mg by mouth daily as needed.  0  . amLODipine (NORVASC) 10 MG tablet Take 10 mg by mouth daily.    Marland Kitchen aspirin 81 MG tablet Take 81 mg by mouth daily.    . cholecalciferol (VITAMIN D) 1000 UNITS tablet Take 2,000 Units by mouth daily.     . cloNIDine (CATAPRES - DOSED IN MG/24 HR) 0.3 mg/24hr patch Place 1 patch (0.3 mg total) onto the skin every 7 (seven) days. friday    . furosemide (LASIX) 20 MG tablet Take 1 tablet (20 mg total) by mouth every other day. 30 tablet 4  . hydrALAZINE (APRESOLINE) 100 MG tablet Take 1 tablet (100 mg total) by mouth 3 (three) times daily.    Marland Kitchen spironolactone (ALDACTONE) 25 MG tablet Take 0.5 tablets (12.5 mg total) by mouth daily. 60 tablet 0  . vitamin B-12 (CYANOCOBALAMIN) 500 MCG tablet Take 500 mcg by mouth daily.     No current facility-administered medications for this visit.      Past Medical History:  Diagnosis Date  . ANEMIA, IRON DEFICIENCY 05/07/2009   Qualifier: Diagnosis of  By: Loanne Drilling MD, Jacelyn Pi   . ARTHRITIS 05/30/2008   Qualifier: Diagnosis of  By: Marland Mcalpine    . DIABETES MELLITUS, TYPE II 07/16/2007   Qualifier: Diagnosis of  By: Marca Ancona RMA, Lucy    . Diastolic  congestive heart failure ([place]) 09/2017  . DIVERTICULOSIS, COLON 05/30/2008   Qualifier: Diagnosis of  By: Marland Mcalpine    . Dyspnea   . Glaucoma   . HYPERLIPIDEMIA 07/16/2007   Qualifier: Diagnosis of  By: Reatha Armour, Lucy    . Hypertension   . Normocytic anemia     Past Surgical History:  Procedure Laterality Date  . ABDOMINAL HYSTERECTOMY      Social History   Socioeconomic History  . Marital status: Widowed    Spouse name: Not on file  . Number of children: Not on file  . Years of education: Not on file  . Highest education level: Not on file  Occupational History  . Not on file  Social Needs  . Financial resource strain: Not on file  . Food insecurity:    Worry: Not on file    Inability: Not on file  . Transportation needs:    Medical: Not on file    Non-medical: Not on file  Tobacco Use  . Smoking status: Former Research scientist (life sciences)  . Smokeless tobacco: Never Used  Substance and Sexual Activity  . Alcohol use: No  . Drug use: No  . Sexual activity: Not on file  Lifestyle  . Physical activity:    Days per week: Not on file    Minutes per session: Not on file  . Stress:  Not on file  Relationships  . Social connections:    Talks on phone: Not on file    Gets together: Not on file    Attends religious service: Not on file    Active member of club or organization: Not on file    Attends meetings of clubs or organizations: Not on file    Relationship status: Not on file  . Intimate partner violence:    Fear of current or ex partner: Not on file    Emotionally abused: Not on file    Physically abused: Not on file    Forced sexual activity: Not on file  Other Topics Concern  . Not on file  Social History Narrative   Widowed.  No children.  Has a brother.      Family History  Problem Relation Age of Onset  . Diabetes Mellitus II Unknown   . Pancreatic cancer Mother   . Heart disease Father   . CAD Brother     ROS: no fevers or chills, productive cough,  hemoptysis, dysphasia, odynophagia, melena, hematochezia, dysuria, hematuria, rash, seizure activity, orthopnea, PND, pedal edema, claudication. Remaining systems are negative.  Physical Exam: Well-developed well-nourished in no acute distress.  Skin is warm and dry.  HEENT is normal.  Neck is supple.  Chest is clear to auscultation with normal expansion.  Cardiovascular exam is regular rate and rhythm.  2/6 systolic murmur left sternal border. Abdominal exam nontender or distended. No masses palpated. Extremities show no edema. neuro grossly intact  ECG-sinus rhythm with first-degree AV block.  Left anterior fascicular block.  No ST changes.  Personally reviewed  A/P  1 chest pain-patient has not had recurrent symptoms and previous nuclear study showed no ischemia.  No plans for further evaluation at this point.  2 history of moderate mitral regurgitation-we will arrange follow-up echocardiogram.  Note she also sounds to have an aortic stenosis murmur.  3 hypertension-patient's blood pressure is controlled.  Continue present medications and follow.  4 chronic diastolic congestive heart failure-she is euvolemic on examination.  Continue present dose of diuretic.  Check potassium and renal function.  We discussed fluid restriction and low-sodium diet.  Check potassium and renal function.  Kirk Ruths, MD

## 2018-11-15 ENCOUNTER — Telehealth: Payer: Self-pay | Admitting: *Deleted

## 2018-11-15 ENCOUNTER — Encounter: Payer: Self-pay | Admitting: Cardiology

## 2018-11-15 ENCOUNTER — Ambulatory Visit (INDEPENDENT_AMBULATORY_CARE_PROVIDER_SITE_OTHER): Payer: Medicare HMO | Admitting: Cardiology

## 2018-11-15 VITALS — BP 138/62 | HR 58 | Ht 68.0 in | Wt 161.0 lb

## 2018-11-15 DIAGNOSIS — I5032 Chronic diastolic (congestive) heart failure: Secondary | ICD-10-CM | POA: Diagnosis not present

## 2018-11-15 DIAGNOSIS — I10 Essential (primary) hypertension: Secondary | ICD-10-CM | POA: Diagnosis not present

## 2018-11-15 DIAGNOSIS — I059 Rheumatic mitral valve disease, unspecified: Secondary | ICD-10-CM | POA: Diagnosis not present

## 2018-11-15 LAB — BASIC METABOLIC PANEL
BUN/Creatinine Ratio: 18 (ref 12–28)
BUN: 35 mg/dL — ABNORMAL HIGH (ref 8–27)
CO2: 19 mmol/L — ABNORMAL LOW (ref 20–29)
Calcium: 9.8 mg/dL (ref 8.7–10.3)
Chloride: 101 mmol/L (ref 96–106)
Creatinine, Ser: 1.96 mg/dL — ABNORMAL HIGH (ref 0.57–1.00)
GFR calc Af Amer: 27 mL/min/{1.73_m2} — ABNORMAL LOW (ref >59)
GFR calc non Af Amer: 23 mL/min/{1.73_m2} — ABNORMAL LOW (ref >59)
Glucose: 186 mg/dL — ABNORMAL HIGH (ref 65–99)
Potassium: 4.7 mmol/L (ref 3.5–5.2)
Sodium: 139 mmol/L (ref 134–144)

## 2018-11-15 MED ORDER — FUROSEMIDE 20 MG PO TABS: ORAL_TABLET | ORAL | 4 refills | Status: DC

## 2018-11-15 NOTE — Patient Instructions (Signed)
Medication Instructions:  NO CHANGE If you need a refill on your cardiac medications before your next appointment, please call your pharmacy.   Lab work: Your physician recommends that you HAVE LAB WORK TODAY If you have labs (blood work) drawn today and your tests are completely normal, you will receive your results only by: Marland Kitchen MyChart Message (if you have MyChart) OR . A paper copy in the mail If you have any lab test that is abnormal or we need to change your treatment, we will call you to review the results.  Testing/Procedures: Your physician has requested that you have an echocardiogram. Echocardiography is a painless test that uses sound waves to create images of your heart. It provides your doctor with information about the size and shape of your heart and how well your heart's chambers and valves are working. This procedure takes approximately one hour. There are no restrictions for this procedure.AT THE [address] OFFICE    Follow-Up: At [facility], you and your health needs are our priority.  As part of our continuing mission to provide you with exceptional heart care, we have created designated Provider Care Teams.  These Care Teams include your primary Cardiologist (physician) and Advanced Practice Providers (APPs -  Physician Assistants and Nurse Practitioners) who all work together to provide you with the care you need, when you need it. You will need a follow up appointment in 6 months.  Please call our office 2 months in advance to schedule this appointment.  You may see Kirk Ruths, MD or one of the following Advanced Practice Providers on your designated Care Team:   Kerin Ransom, PA-C Roby Lofts, [place] . Sande Rives, PA-C

## 2018-11-15 NOTE — Telephone Encounter (Addendum)
Spoke with pt, Aware of dr Jacalyn Lefevre recommendations. Patient voiced understanding of medication changes and Lab orders mailed to the pt.  ----- Message from Lelon Perla, MD sent at 11/15/2018  4:43 PM EST ----- Change lasix to 20 mg daily only as needed; DC spironolactone; bmet one week Kirk Ruths

## 2018-11-17 DIAGNOSIS — E1121 Type 2 diabetes mellitus with diabetic nephropathy: Secondary | ICD-10-CM | POA: Diagnosis not present

## 2018-11-17 DIAGNOSIS — I10 Essential (primary) hypertension: Secondary | ICD-10-CM | POA: Diagnosis not present

## 2018-11-17 DIAGNOSIS — E7849 Other hyperlipidemia: Secondary | ICD-10-CM | POA: Diagnosis not present

## 2018-11-17 DIAGNOSIS — D649 Anemia, unspecified: Secondary | ICD-10-CM | POA: Diagnosis not present

## 2018-11-17 DIAGNOSIS — E1165 Type 2 diabetes mellitus with hyperglycemia: Secondary | ICD-10-CM | POA: Diagnosis not present

## 2018-11-17 DIAGNOSIS — E119 Type 2 diabetes mellitus without complications: Secondary | ICD-10-CM | POA: Diagnosis not present

## 2018-11-17 DIAGNOSIS — E1142 Type 2 diabetes mellitus with diabetic polyneuropathy: Secondary | ICD-10-CM | POA: Diagnosis not present

## 2018-11-17 DIAGNOSIS — I509 Heart failure, unspecified: Secondary | ICD-10-CM | POA: Diagnosis not present

## 2018-11-17 DIAGNOSIS — J449 Chronic obstructive pulmonary disease, unspecified: Secondary | ICD-10-CM | POA: Diagnosis not present

## 2018-11-22 ENCOUNTER — Telehealth: Payer: Self-pay | Admitting: Cardiology

## 2018-11-22 NOTE — Telephone Encounter (Signed)
  Dr Inda Merlin needs notes regarding the changes made to Ms. Dahan medications by Dr Stanford Breed at last visit. The office received visit note but it did not mention the change but patient told them about the change. Please fax to [phone]

## 2018-11-22 NOTE — Telephone Encounter (Signed)
Sent a fax via epic of lab results, and changes in medications to PCP office of message below:  Notes recorded by Lelon Perla, MD on 11/15/2018 at 4:43 PM EST Change lasix to 20 mg daily only as needed; DC spironolactone; bmet one week Kirk Ruths

## 2018-12-06 DIAGNOSIS — I5032 Chronic diastolic (congestive) heart failure: Secondary | ICD-10-CM | POA: Diagnosis not present

## 2018-12-07 ENCOUNTER — Encounter: Payer: Self-pay | Admitting: *Deleted

## 2018-12-07 LAB — BASIC METABOLIC PANEL
BUN/Creatinine Ratio: 16 (ref 12–28)
BUN: 28 mg/dL — ABNORMAL HIGH (ref 8–27)
CO2: 17 mmol/L — ABNORMAL LOW (ref 20–29)
Calcium: 9.4 mg/dL (ref 8.7–10.3)
Chloride: 105 mmol/L (ref 96–106)
Creatinine, Ser: 1.74 mg/dL — ABNORMAL HIGH (ref 0.57–1.00)
GFR calc Af Amer: 31 mL/min/{1.73_m2} — ABNORMAL LOW (ref >59)
GFR calc non Af Amer: 27 mL/min/{1.73_m2} — ABNORMAL LOW (ref >59)
Glucose: 122 mg/dL — ABNORMAL HIGH (ref 65–99)
Potassium: 5.1 mmol/L (ref 3.5–5.2)
Sodium: 140 mmol/L (ref 134–144)

## 2018-12-14 DIAGNOSIS — I509 Heart failure, unspecified: Secondary | ICD-10-CM | POA: Diagnosis not present

## 2018-12-14 DIAGNOSIS — I10 Essential (primary) hypertension: Secondary | ICD-10-CM | POA: Diagnosis not present

## 2018-12-14 DIAGNOSIS — E119 Type 2 diabetes mellitus without complications: Secondary | ICD-10-CM | POA: Diagnosis not present

## 2018-12-14 DIAGNOSIS — D649 Anemia, unspecified: Secondary | ICD-10-CM | POA: Diagnosis not present

## 2018-12-14 DIAGNOSIS — E7849 Other hyperlipidemia: Secondary | ICD-10-CM | POA: Diagnosis not present

## 2018-12-14 DIAGNOSIS — J449 Chronic obstructive pulmonary disease, unspecified: Secondary | ICD-10-CM | POA: Diagnosis not present

## 2018-12-14 DIAGNOSIS — E1165 Type 2 diabetes mellitus with hyperglycemia: Secondary | ICD-10-CM | POA: Diagnosis not present

## 2018-12-14 DIAGNOSIS — E1142 Type 2 diabetes mellitus with diabetic polyneuropathy: Secondary | ICD-10-CM | POA: Diagnosis not present

## 2018-12-14 DIAGNOSIS — E1121 Type 2 diabetes mellitus with diabetic nephropathy: Secondary | ICD-10-CM | POA: Diagnosis not present

## 2019-01-04 ENCOUNTER — Other Ambulatory Visit: Payer: Self-pay

## 2019-01-04 ENCOUNTER — Ambulatory Visit (HOSPITAL_COMMUNITY): Payer: Medicare HMO | Attending: Cardiology

## 2019-01-04 DIAGNOSIS — I059 Rheumatic mitral valve disease, unspecified: Secondary | ICD-10-CM | POA: Insufficient documentation

## 2019-01-06 DIAGNOSIS — H43823 Vitreomacular adhesion, bilateral: Secondary | ICD-10-CM | POA: Diagnosis not present

## 2019-01-06 DIAGNOSIS — H35033 Hypertensive retinopathy, bilateral: Secondary | ICD-10-CM | POA: Diagnosis not present

## 2019-01-06 DIAGNOSIS — H3582 Retinal ischemia: Secondary | ICD-10-CM | POA: Diagnosis not present

## 2019-01-06 DIAGNOSIS — E113593 Type 2 diabetes mellitus with proliferative diabetic retinopathy without macular edema, bilateral: Secondary | ICD-10-CM | POA: Diagnosis not present

## 2019-01-24 DIAGNOSIS — I10 Essential (primary) hypertension: Secondary | ICD-10-CM | POA: Diagnosis not present

## 2019-01-27 DIAGNOSIS — H93292 Other abnormal auditory perceptions, left ear: Secondary | ICD-10-CM | POA: Diagnosis not present

## 2019-02-07 DIAGNOSIS — I951 Orthostatic hypotension: Secondary | ICD-10-CM | POA: Diagnosis not present

## 2019-02-07 DIAGNOSIS — H9312 Tinnitus, left ear: Secondary | ICD-10-CM | POA: Diagnosis not present

## 2019-02-14 DIAGNOSIS — I10 Essential (primary) hypertension: Secondary | ICD-10-CM | POA: Diagnosis not present

## 2019-02-14 DIAGNOSIS — E119 Type 2 diabetes mellitus without complications: Secondary | ICD-10-CM | POA: Diagnosis not present

## 2019-03-16 ENCOUNTER — Telehealth: Payer: Self-pay | Admitting: Cardiology

## 2019-03-16 NOTE — Telephone Encounter (Signed)
Per pt has noted lower heart rate for about 10 days and has had  readings from b/p machine of 34 and 37 went shopping today and was 81 Pt did note a weak spell the other day no other symptoms Will forward to Dr Stanford Breed for review and recommendations  ./cy

## 2019-03-16 NOTE — Telephone Encounter (Signed)
° ° °  Patient calling with questions regarding HR.  STAT if HR is under 50 or over 120 (normal HR is 60-100 beats per minute)  1) What is your heart rate? 81  2) Do you have a log of your heart rate readings (document readings)? 34, 37  Do you have any other symptoms? WEAKNESS

## 2019-03-16 NOTE — Telephone Encounter (Signed)
Left message for patient of dr Jacalyn Lefevre recommendations.

## 2019-03-16 NOTE — Telephone Encounter (Signed)
Would continue to follow [place]

## 2019-04-18 ENCOUNTER — Ambulatory Visit (INDEPENDENT_AMBULATORY_CARE_PROVIDER_SITE_OTHER): Payer: Medicare HMO

## 2019-04-18 ENCOUNTER — Encounter (HOSPITAL_COMMUNITY): Payer: Self-pay

## 2019-04-18 ENCOUNTER — Ambulatory Visit (HOSPITAL_COMMUNITY)
Admission: EM | Admit: 2019-04-18 | Discharge: 2019-04-18 | Disposition: A | Discharge status: Home / Self Care | Payer: Medicare HMO | Attending: Family Medicine | Admitting: Family Medicine

## 2019-04-18 ENCOUNTER — Other Ambulatory Visit: Payer: Self-pay

## 2019-04-18 DIAGNOSIS — R0602 Shortness of breath: Secondary | ICD-10-CM

## 2019-04-18 IMAGING — DX CHEST - 2 VIEW
2 series · 2 of 2 positions shown · non-contrast
Comparison: Radiograph [DATE].

CLINICAL DATA: Shortness of breath.

EXAM:
CHEST - 2 VIEW

[chest pa]
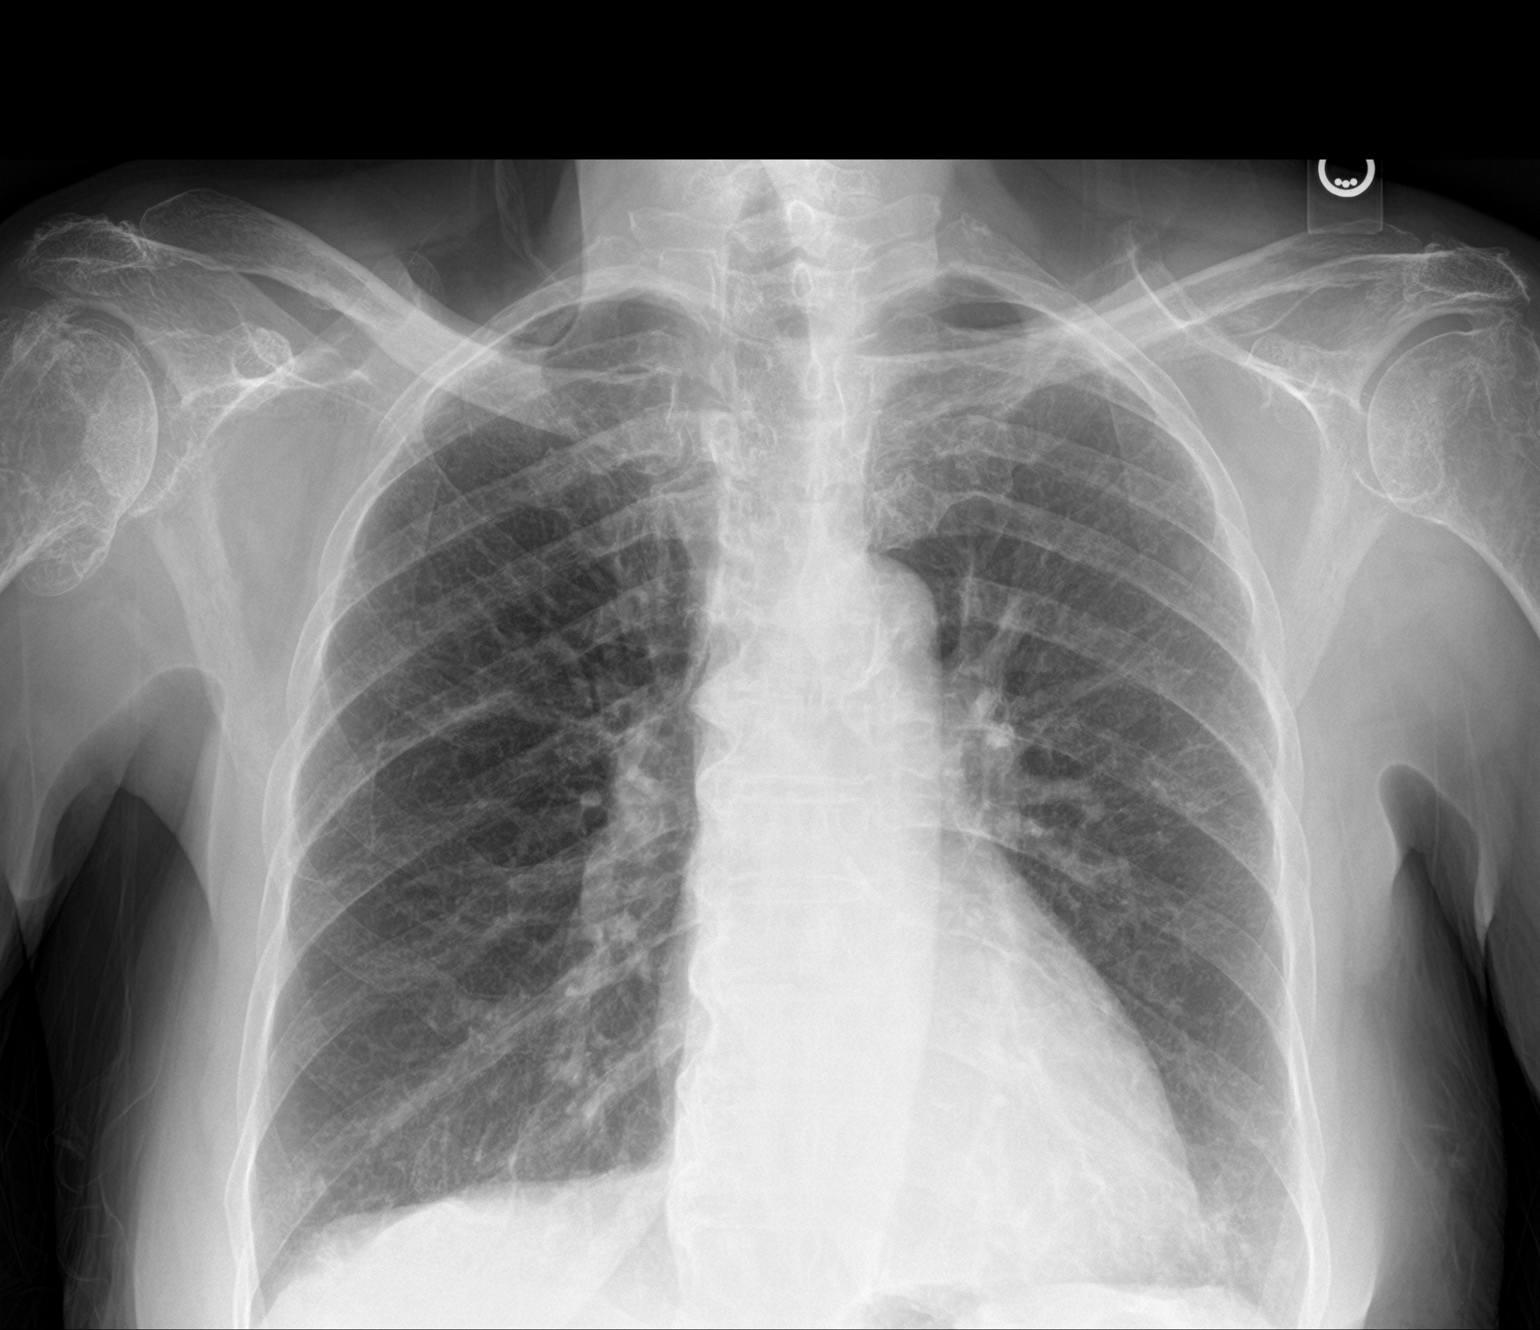

[chest lat]
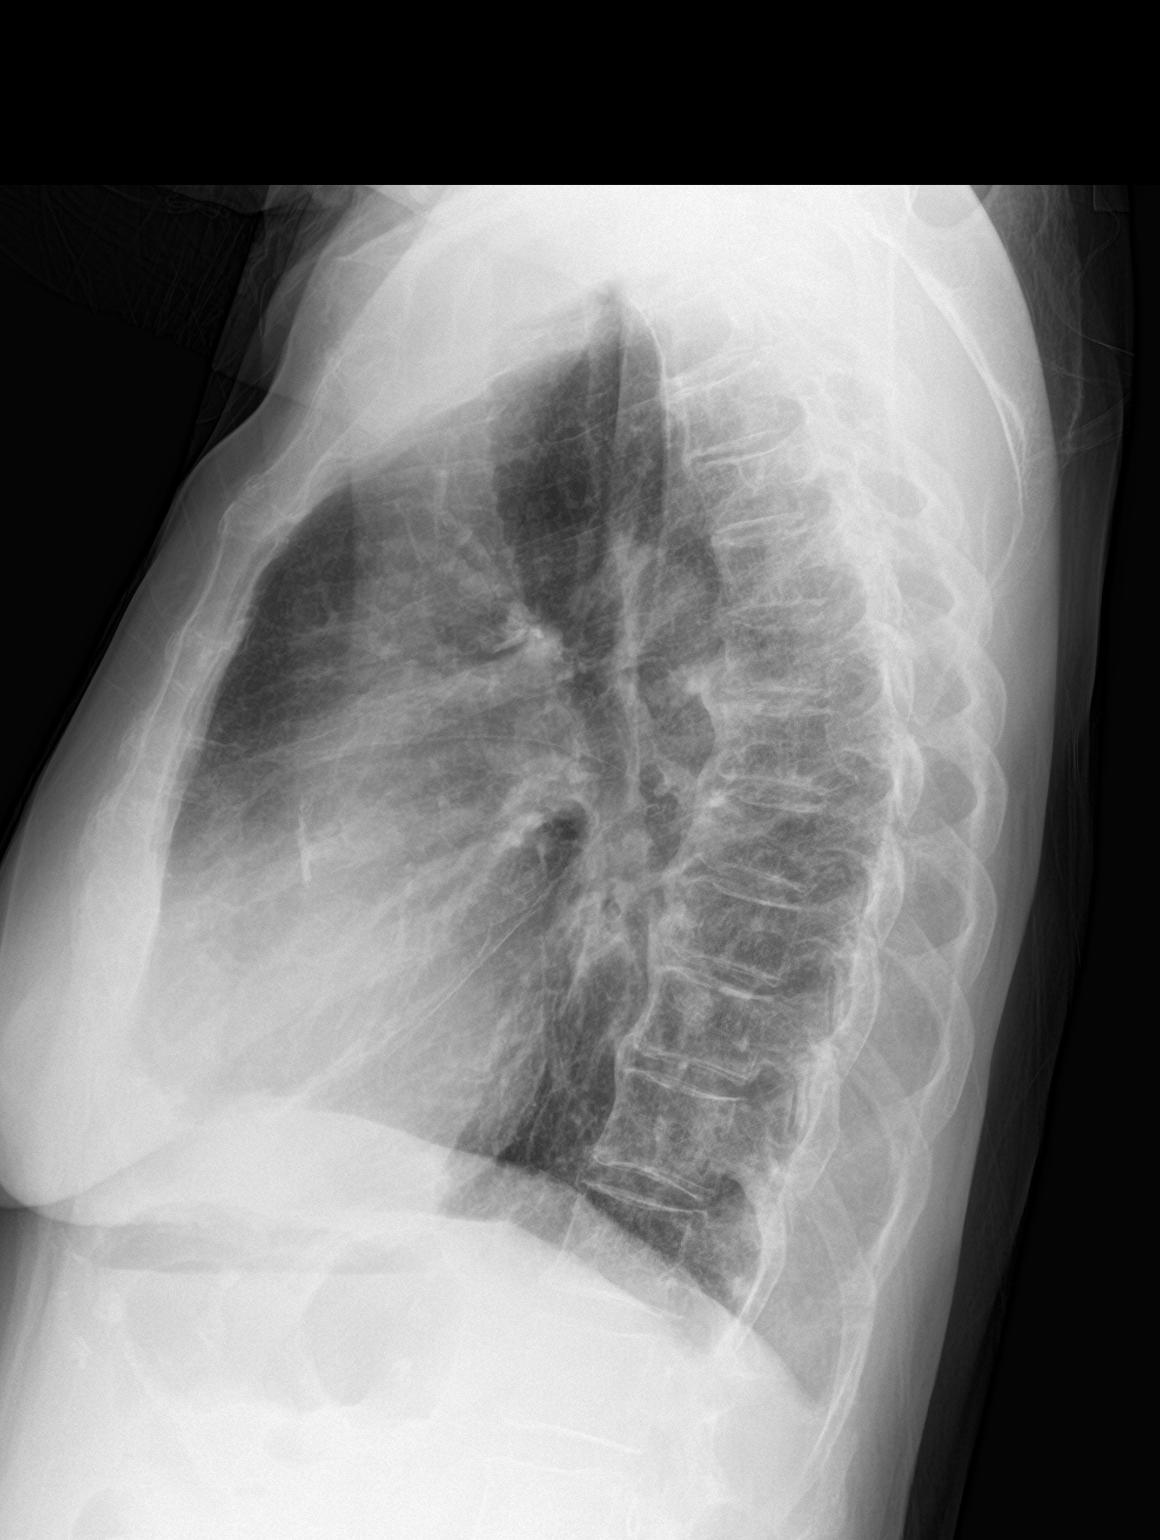

[2 of 2 positions shown; findings below may reference images not displayed]

FINDINGS: The heart size and mediastinal contours are within normal limits.
Atherosclerosis of thoracic aorta is noted. No pneumothorax or
pleural effusion is noted. Minimal bibasilar subsegmental
atelectasis or scarring is noted. The visualized skeletal structures
are unremarkable.
IMPRESSION: Minimal bibasilar subsegmental atelectasis or scarring.

Aortic Atherosclerosis ([DT]-[DT]).

## 2019-04-18 MED ORDER — LORATADINE 5 MG/5ML PO SYRP: 5.0000 mg | ORAL_SOLUTION | Freq: Every day | ORAL | 0 refills | Status: DC

## 2019-04-18 NOTE — Discharge Instructions (Signed)
Your xray is normal- no fluid or penumonia on lungs Please continue medicines as prescribed May try 5 mL claritin daily to help with mucous  Please follow up if symptoms worsening, developing cough, fever, chills, body aches, increased swelling, weight increase

## 2019-04-18 NOTE — ED Triage Notes (Signed)
Pt cc she has a SOB and this started on Saturday.  Pt states the SOB is worst at night.

## 2019-04-18 NOTE — ED Provider Notes (Signed)
[place]    CSN: [id number] Arrival date & time: 04/18/19  0931     History   Chief Complaint Chief Complaint  Patient presents with   Shortness of Breath    HPI Krista Rosales is a 85 y.o. female history of CAD, DM type II, hyperlipidemia, hypertension, CHF, presenting today for evaluation of shortness of breath.  Patient states that she has had shortness of breath over the past 2 days.  This is been mainly notable at nighttime.  Patient lies on her side, denies sleeping on multiple pillows.  She notes that she usually walks 2.5 blocks daily and she has been able to keep this up without worsening shortness of breath with this.  Denies chest pain.  Denies cough.  Has had some mild mucus in her throat.  Denies sore throat.  Denies fever, chills or body aches.  Patient is currently on Lasix 20 mg every other day for leg swelling.  Denies any increase in leg swelling.  Weight has been stable.  Previously discharged from hospital with weight of 143, notes that her weight this morning was 144. Denies any recent travel, denies any known exposure to COVID-19.  Denies close sick contacts.  HPI  Past Medical History:  Diagnosis Date   ANEMIA, IRON DEFICIENCY 05/07/2009   Qualifier: Diagnosis of  By: Loanne Drilling MD, Sean A    ARTHRITIS 05/30/2008   Qualifier: Diagnosis of  By: Smith NCMA, Van, TYPE II 07/16/2007   Qualifier: Diagnosis of  By: Marca Ancona RMA, Lucy     Diastolic congestive heart failure ([place]) 09/2017   DIVERTICULOSIS, COLON 05/30/2008   Qualifier: Diagnosis of  By: Marland Mcalpine     Dyspnea    Glaucoma    HYPERLIPIDEMIA 07/16/2007   Qualifier: Diagnosis of  By: Marca Ancona RMA, Lucy     Hypertension    Normocytic anemia     Patient Active Problem List   Diagnosis Date Noted   Chest pain 87/68/1157   Diastolic congestive heart failure ([place]) 09/30/2017   Diabetes type 2, uncontrolled ([place])    Headache    Hypertensive urgency      Hypokalemia    Bradycardia    Normocytic anemia    Chronic diastolic CHF (congestive heart failure) ([place])    Other emphysema ([place])    Glaucoma    Hypertension 02/20/2015   Malignant hypertension 02/20/2015   ANEMIA, IRON DEFICIENCY 05/07/2009   DYSPNEA 05/07/2009   CONSTIPATION 06/01/2008   HEMORRHOIDS 05/30/2008   DIVERTICULOSIS, COLON 05/30/2008   ARTHRITIS 05/30/2008   COUGH 02/03/2008   Diabetes mellitus type 2 with complications ([place]) 26/20/3559   HYPERLIPIDEMIA 07/16/2007   Essential hypertension 07/16/2007   Coronary atherosclerosis 07/16/2007   ALLERGIC RHINITIS 07/16/2007   OSTEOPOROSIS 07/16/2007    Past Surgical History:  Procedure Laterality Date   ABDOMINAL HYSTERECTOMY      OB History   No obstetric history on file.      Home Medications    Prior to Admission medications   Medication Sig Start Date End Date Taking? Authorizing Provider  acetaminophen (TYLENOL) 500 MG tablet Take 500 mg by mouth every 6 (six) hours as needed.    [provider]  ALPRAZolam Duanne Moron) 0.25 MG tablet Take 0.25 mg by mouth daily as needed. 09/07/17   [provider]  amLODipine (NORVASC) 10 MG tablet Take 10 mg by mouth daily.    [provider]  aspirin 81 MG tablet Take 81  mg by mouth daily.    [provider]  cholecalciferol (VITAMIN D) 1000 UNITS tablet Take 2,000 Units by mouth daily.     [provider]  cloNIDine (CATAPRES - DOSED IN MG/24 HR) 0.3 mg/24hr patch Place 1 patch (0.3 mg total) onto the skin every 7 (seven) days. friday 11/21/17   Hongalgi, Lenis Dickinson, MD  furosemide (LASIX) 20 MG tablet One tablet once daily as needed for swelling 11/15/18   Lelon Perla, MD  hydrALAZINE (APRESOLINE) 100 MG tablet Take 1 tablet (100 mg total) by mouth 3 (three) times daily. 11/21/17   Hongalgi, Lenis Dickinson, MD  loratadine (CLARITIN) 5 MG/5ML syrup Take 5 mLs (5 mg total) by mouth daily. 04/18/19   Peyson Postema  C, PA-C  vitamin B-12 (CYANOCOBALAMIN) 500 MCG tablet Take 500 mcg by mouth daily.    [provider]    Family History Family History  Problem Relation Age of Onset   Diabetes Mellitus II Other    Pancreatic cancer Mother    Heart disease Father    CAD Brother     Social History Social History   Tobacco Use   Smoking status: Former Smoker   Smokeless tobacco: Never Used  Substance Use Topics   Alcohol use: No   Drug use: No     Allergies   Wellbutrin [bupropion]; Codeine; and Penicillins   Review of Systems Review of Systems  Constitutional: Negative for activity change, appetite change, chills, fatigue, fever and unexpected weight change.  HENT: Negative for congestion, ear pain, rhinorrhea, sinus pressure, sore throat and trouble swallowing.   Eyes: Negative for discharge and redness.  Respiratory: Positive for shortness of breath. Negative for cough and chest tightness.   Cardiovascular: Negative for chest pain.  Gastrointestinal: Negative for abdominal pain, diarrhea, nausea and vomiting.  Musculoskeletal: Negative for myalgias.  Skin: Negative for rash.  Neurological: Negative for dizziness, light-headedness and headaches.     Physical Exam Triage Vital Signs ED Triage Vitals  Enc Vitals Group     BP 04/18/19 0959 (!) 155/103     Pulse Rate 04/18/19 0959 83     Resp 04/18/19 0959 16     Temp 04/18/19 0959 98.7 F (37.1 C)     Temp Source 04/18/19 0959 Oral     SpO2 04/18/19 0959 100 %     Weight 04/18/19 0958 144 lb (65.3 kg)     Height --      Head Circumference --      Peak Flow --      Pain Score 04/18/19 0958 2     Pain Loc --      Pain Edu? --      Excl. in [place]? --    No data found.  Updated Vital Signs BP (!) 155/103 (BP Location: Right Arm)    Pulse 83    Temp 98.7 F (37.1 C) (Oral)    Resp 16    Wt 144 lb (65.3 kg)    SpO2 100%    BMI 21.90 kg/m   Visual Acuity Right Eye Distance:   Left Eye Distance:   Bilateral  Distance:    Right Eye Near:   Left Eye Near:    Bilateral Near:     Physical Exam Vitals signs and nursing note reviewed.  Constitutional:      General: She is not in acute distress.    Appearance: She is well-developed.  HENT:     Head: Normocephalic and atraumatic.  Nose:     Comments: Nasal mucosa pink, nonswollen turbinates    Mouth/Throat:     Comments: Oral mucosa pink and moist, no tonsillar enlargement or exudate. Posterior pharynx patent and nonerythematous, no uvula deviation or swelling. Normal phonation.  Eyes:     Conjunctiva/sclera: Conjunctivae normal.  Neck:     Musculoskeletal: Neck supple.  Cardiovascular:     Rate and Rhythm: Normal rate and regular rhythm.     Heart sounds: Murmur present.  Pulmonary:     Effort: Pulmonary effort is normal. No respiratory distress.     Breath sounds: Normal breath sounds.     Comments: Breathing comfortably at rest, CTABL, no wheezing, rales or other adventitious sounds auscultated Abdominal:     Palpations: Abdomen is soft.     Tenderness: There is no abdominal tenderness.  Musculoskeletal:     Comments: No lower extremity edema, no calf tenderness  Skin:    General: Skin is warm and dry.  Neurological:     Mental Status: She is alert.      UC Treatments / Results  Labs (all labs ordered are listed, but only abnormal results are displayed) Labs Reviewed - No data to display  EKG None  Radiology Dg Chest 2 View  Result Date: 04/18/2019 CLINICAL DATA:  Shortness of breath. EXAM: CHEST - 2 VIEW COMPARISON:  Radiograph of November 20, 2017. FINDINGS: The heart size and mediastinal contours are within normal limits. Atherosclerosis of thoracic aorta is noted. No pneumothorax or pleural effusion is noted. Minimal bibasilar subsegmental atelectasis or scarring is noted. The visualized skeletal structures are unremarkable. IMPRESSION: Minimal bibasilar subsegmental atelectasis or scarring. Aortic Atherosclerosis  (ICD10-I70.0). Electronically Signed   By: Marijo Conception M.D.   On: 04/18/2019 10:36    Procedures Procedures (including critical care time)  Medications Ordered in UC Medications - No data to display  Initial Impression / Assessment and Plan / UC Course  I have reviewed the triage vital signs and the nursing notes.  Pertinent labs & imaging results that were available during my care of the patient were reviewed by me and considered in my medical decision making (see chart for details).     Chest x-ray negative for fluid overload or pneumonia.  Shortness of breath mainly at nighttime.  Unrelated to exertion.  Vital signs stable without fever, hypoxia or tachycardia.  No lower extremity edema, patient does not seem to be retaining fluid.  No other systemic symptoms suggestive of COVID-19, no cough.  Will recommend continued monitoring, may use Claritin to help with any drainage/mucus.Discussed strict return precautions. Patient verbalized understanding and is agreeable with plan.  Final Clinical Impressions(s) / UC Diagnoses   Final diagnoses:  SOB (shortness of breath)     Discharge Instructions     Your xray is normal- no fluid or penumonia on lungs Please continue medicines as prescribed May try 5 mL claritin daily to help with mucous  Please follow up if symptoms worsening, developing cough, fever, chills, body aches, increased swelling, weight increase    ED Prescriptions    Medication Sig Dispense Auth. Provider   loratadine (CLARITIN) 5 MG/5ML syrup Take 5 mLs (5 mg total) by mouth daily. 60 mL Kylan Liberati C, PA-C     Controlled Substance Prescriptions [place] Controlled Substance Registry consulted? Not Applicable   Janith Lima, [place] 04/18/19 1049

## 2019-04-21 DIAGNOSIS — D649 Anemia, unspecified: Secondary | ICD-10-CM | POA: Diagnosis not present

## 2019-04-21 DIAGNOSIS — E1142 Type 2 diabetes mellitus with diabetic polyneuropathy: Secondary | ICD-10-CM | POA: Diagnosis not present

## 2019-04-21 DIAGNOSIS — E7849 Other hyperlipidemia: Secondary | ICD-10-CM | POA: Diagnosis not present

## 2019-04-21 DIAGNOSIS — J449 Chronic obstructive pulmonary disease, unspecified: Secondary | ICD-10-CM | POA: Diagnosis not present

## 2019-04-21 DIAGNOSIS — E1165 Type 2 diabetes mellitus with hyperglycemia: Secondary | ICD-10-CM | POA: Diagnosis not present

## 2019-04-21 DIAGNOSIS — E1121 Type 2 diabetes mellitus with diabetic nephropathy: Secondary | ICD-10-CM | POA: Diagnosis not present

## 2019-04-21 DIAGNOSIS — E119 Type 2 diabetes mellitus without complications: Secondary | ICD-10-CM | POA: Diagnosis not present

## 2019-04-21 DIAGNOSIS — I509 Heart failure, unspecified: Secondary | ICD-10-CM | POA: Diagnosis not present

## 2019-04-21 DIAGNOSIS — I10 Essential (primary) hypertension: Secondary | ICD-10-CM | POA: Diagnosis not present

## 2019-04-25 ENCOUNTER — Encounter: Payer: Self-pay | Admitting: Cardiology

## 2019-04-25 NOTE — Telephone Encounter (Signed)
New Message    Pt is calling about her appt and she is wanting to keep it as an office visit    Please call

## 2019-04-25 NOTE — Telephone Encounter (Signed)
Left message for pt to call.

## 2019-04-25 NOTE — Telephone Encounter (Signed)
This encounter was created in error - please disregard.

## 2019-04-25 NOTE — Telephone Encounter (Signed)
Follow up   Patient is returning call your call.

## 2019-04-28 ENCOUNTER — Other Ambulatory Visit: Payer: Self-pay

## 2019-04-28 ENCOUNTER — Ambulatory Visit (HOSPITAL_COMMUNITY)
Admission: EM | Admit: 2019-04-28 | Discharge: 2019-04-28 | Disposition: A | Discharge status: Home / Self Care | Payer: Medicare HMO | Attending: Family Medicine | Admitting: Family Medicine

## 2019-04-28 ENCOUNTER — Encounter (HOSPITAL_COMMUNITY): Payer: Self-pay | Admitting: Emergency Medicine

## 2019-04-28 DIAGNOSIS — Z1389 Encounter for screening for other disorder: Secondary | ICD-10-CM

## 2019-04-28 DIAGNOSIS — R0602 Shortness of breath: Secondary | ICD-10-CM

## 2019-04-28 LAB — POCT URINALYSIS DIP (DEVICE)
Bilirubin Urine: NEGATIVE
Glucose, UA: NEGATIVE mg/dL
Hgb urine dipstick: NEGATIVE
Ketones, ur: NEGATIVE mg/dL
Nitrite: NEGATIVE
Protein, ur: NEGATIVE mg/dL
Specific Gravity, Urine: 1.015 (ref 1.005–1.030)
Urobilinogen, UA: 0.2 mg/dL (ref 0.0–1.0)
pH: 5.5 (ref 5.0–8.0)

## 2019-04-28 LAB — COMPREHENSIVE METABOLIC PANEL
ALT: 10 U/L (ref 0–44)
AST: 13 U/L — ABNORMAL LOW (ref 15–41)
Albumin: 4.3 g/dL (ref 3.5–5.0)
Alkaline Phosphatase: 39 U/L (ref 38–126)
Anion gap: 13 (ref 5–15)
BUN: 36 mg/dL — ABNORMAL HIGH (ref 8–23)
CO2: 20 mmol/L — ABNORMAL LOW (ref 22–32)
Calcium: 9.6 mg/dL (ref 8.9–10.3)
Chloride: 102 mmol/L (ref 98–111)
Creatinine, Ser: 2.19 mg/dL — ABNORMAL HIGH (ref 0.44–1.00)
GFR calc Af Amer: 24 mL/min — ABNORMAL LOW (ref >60)
GFR calc non Af Amer: 20 mL/min — ABNORMAL LOW (ref >60)
Glucose, Bld: 265 mg/dL — ABNORMAL HIGH (ref 70–99)
Potassium: 4.6 mmol/L (ref 3.5–5.1)
Sodium: 135 mmol/L (ref 135–145)
Total Bilirubin: 0.4 mg/dL (ref 0.3–1.2)
Total Protein: 8.1 g/dL (ref 6.5–8.1)

## 2019-04-28 LAB — CBC WITH DIFFERENTIAL/PLATELET
Abs Immature Granulocytes: 0.02 10*3/uL (ref 0.00–0.07)
Basophils Absolute: 0 10*3/uL (ref 0.0–0.1)
Basophils Relative: 1 %
Eosinophils Absolute: 0.1 10*3/uL (ref 0.0–0.5)
Eosinophils Relative: 1 %
HCT: 36.3 % (ref 36.0–46.0)
Hemoglobin: 12 g/dL (ref 12.0–15.0)
Immature Granulocytes: 0 %
Lymphocytes Relative: 11 %
Lymphs Abs: 0.8 10*3/uL (ref 0.7–4.0)
MCH: 28.8 pg (ref 26.0–34.0)
MCHC: 33.1 g/dL (ref 30.0–36.0)
MCV: 87.3 fL (ref 80.0–100.0)
Monocytes Absolute: 0.5 10*3/uL (ref 0.1–1.0)
Monocytes Relative: 7 %
Neutro Abs: 5.6 10*3/uL (ref 1.7–7.7)
Neutrophils Relative %: 80 %
Platelets: 338 10*3/uL (ref 150–400)
RBC: 4.16 MIL/uL (ref 3.87–5.11)
RDW: 13.1 % (ref 11.5–15.5)
WBC: 6.9 10*3/uL (ref 4.0–10.5)
nRBC: 0 % (ref 0.0–0.2)

## 2019-04-28 NOTE — ED Provider Notes (Signed)
[place]    CSN: [id number] Arrival date & time: 04/28/19  7564     History   Chief Complaint Chief Complaint  Patient presents with  . Shortness of Breath    HPI Krista Rosales is a 85 y.o. female.   Patient is an 85 year old female with a past medical history of anemia, arthritis, diabetes, hyperlipidemia, hypertension, CHF, hypokalemia.  She is complaining of intermittent shortness of breath.  This is been worsening at nighttime.  Reports she is having trouble falling asleep at times.  She has been having some hot flashes and feels like her legs are weak.  The symptoms have been waxing and waning over the last 4 days.  She has been taking her daily medication as prescribed.  Patient also has a history of anxiety.  She takes Xanax as needed.  She reports this helps with her symptoms at times.  She denies any associated cough, congestion, fevers, chest pain, palpitations.  Denies any lower extremity swelling, calf pain.  She has been walking as she normally does 7 days a week without issues.  Feels that she has been losing weight.  No recent traveling or sick contacts.  ROS per HPI      Past Medical History:  Diagnosis Date  . ANEMIA, IRON DEFICIENCY 05/07/2009   Qualifier: Diagnosis of  By: Loanne Drilling MD, Jacelyn Pi   . ARTHRITIS 05/30/2008   Qualifier: Diagnosis of  By: Marland Mcalpine    . DIABETES MELLITUS, TYPE II 07/16/2007   Qualifier: Diagnosis of  By: Marca Ancona RMA, Lucy    . Diastolic congestive heart failure ([place]) 09/2017  . DIVERTICULOSIS, COLON 05/30/2008   Qualifier: Diagnosis of  By: Marland Mcalpine    . Dyspnea   . Glaucoma   . HYPERLIPIDEMIA 07/16/2007   Qualifier: Diagnosis of  By: Reatha Armour, Lucy    . Hypertension   . Normocytic anemia     Patient Active Problem List   Diagnosis Date Noted  . Chest pain 11/20/2017  . Diastolic congestive heart failure ([place]) 09/30/2017  . Diabetes type 2, uncontrolled ([place])   . Headache   . Hypertensive  urgency   . Hypokalemia   . Bradycardia   . Normocytic anemia   . Chronic diastolic CHF (congestive heart failure) ([place])   . Other emphysema ([place])   . Glaucoma   . Hypertension 02/20/2015  . Malignant hypertension 02/20/2015  . ANEMIA, IRON DEFICIENCY 05/07/2009  . DYSPNEA 05/07/2009  . CONSTIPATION 06/01/2008  . HEMORRHOIDS 05/30/2008  . DIVERTICULOSIS, COLON 05/30/2008  . ARTHRITIS 05/30/2008  . COUGH 02/03/2008  . Diabetes mellitus type 2 with complications ([place]) 33/29/5188  . HYPERLIPIDEMIA 07/16/2007  . Essential hypertension 07/16/2007  . Coronary atherosclerosis 07/16/2007  . ALLERGIC RHINITIS 07/16/2007  . OSTEOPOROSIS 07/16/2007    Past Surgical History:  Procedure Laterality Date  . ABDOMINAL HYSTERECTOMY      OB History   No obstetric history on file.      Home Medications    Prior to Admission medications   Medication Sig Start Date End Date Taking? Authorizing Provider  ALPRAZolam (XANAX) 0.25 MG tablet Take 0.25 mg by mouth daily as needed. 09/07/17  Yes [provider]  amLODipine (NORVASC) 10 MG tablet Take 10 mg by mouth daily.   Yes [provider]  aspirin 81 MG tablet Take 81 mg by mouth daily.   Yes [provider]  cholecalciferol (VITAMIN D) 1000 UNITS tablet Take 2,000 Units by mouth daily.  Yes [provider]  furosemide (LASIX) 20 MG tablet One tablet once daily as needed for swelling 11/15/18  Yes Crenshaw, Denice Bors, MD  hydrALAZINE (APRESOLINE) 100 MG tablet Take 1 tablet (100 mg total) by mouth 3 (three) times daily. 11/21/17  Yes Hongalgi, Lenis Dickinson, MD  loratadine (CLARITIN) 5 MG/5ML syrup Take 5 mLs (5 mg total) by mouth daily. 04/18/19  Yes Wieters, Hallie C, PA-C  vitamin B-12 (CYANOCOBALAMIN) 500 MCG tablet Take 500 mcg by mouth daily.   Yes [provider]  acetaminophen (TYLENOL) 500 MG tablet Take 500 mg by mouth every 6 (six) hours as needed.    [provider]  cloNIDine  (CATAPRES - DOSED IN MG/24 HR) 0.3 mg/24hr patch Place 1 patch (0.3 mg total) onto the skin every 7 (seven) days. friday 11/21/17   Modena Jansky, MD    Family History Family History  Problem Relation Age of Onset  . Diabetes Mellitus II Other   . Pancreatic cancer Mother   . Heart disease Father   . CAD Brother     Social History Social History   Tobacco Use  . Smoking status: Former Research scientist (life sciences)  . Smokeless tobacco: Never Used  Substance Use Topics  . Alcohol use: No  . Drug use: No     Allergies   Wellbutrin [bupropion]; Codeine; and Penicillins   Review of Systems Review of Systems   Physical Exam Triage Vital Signs ED Triage Vitals  Enc Vitals Group     BP 04/28/19 0937 (!) 169/61     Pulse Rate 04/28/19 0937 (!) 103     Resp 04/28/19 0937 16     Temp 04/28/19 0937 98.8 F (37.1 C)     Temp Source 04/28/19 0937 Oral     SpO2 04/28/19 0937 100 %     Weight --      Height --      Head Circumference --      Peak Flow --      Pain Score 04/28/19 0934 0     Pain Loc --      Pain Edu? --      Excl. in [place]? --    No data found.  Updated Vital Signs BP (!) 169/61 (BP Location: Left Arm)   Pulse (!) 103   Temp 98.8 F (37.1 C) (Oral)   Resp 16   SpO2 100%   Visual Acuity Right Eye Distance:   Left Eye Distance:   Bilateral Distance:    Right Eye Near:   Left Eye Near:    Bilateral Near:     Physical Exam Vitals signs and nursing note reviewed.  Constitutional:      General: She is not in acute distress.    Appearance: She is well-developed. She is not ill-appearing, toxic-appearing or diaphoretic.  HENT:     Head: Normocephalic and atraumatic.  Neck:     Musculoskeletal: Normal range of motion.  Cardiovascular:     Rate and Rhythm: Normal rate and regular rhythm.  Pulmonary:     Effort: Pulmonary effort is normal.     Breath sounds: Normal breath sounds.  Musculoskeletal: Normal range of motion.     Right lower leg: She exhibits no  tenderness. No edema.     Left lower leg: She exhibits no tenderness. No edema.  Skin:    General: Skin is warm and dry.  Neurological:     Mental Status: She is alert.  Psychiatric:  Mood and Affect: Mood is anxious.      UC Treatments / Results  Labs (all labs ordered are listed, but only abnormal results are displayed) Labs Reviewed  COMPREHENSIVE METABOLIC PANEL - Abnormal; Notable for the following components:      Result Value   CO2 20 (*)    Glucose, Bld 265 (*)    BUN 36 (*)    Creatinine, Ser 2.19 (*)    AST 13 (*)    GFR calc non Af Amer 20 (*)    GFR calc Af Amer 24 (*)    All other components within normal limits  POCT URINALYSIS DIP (DEVICE) - Abnormal; Notable for the following components:   Leukocytes,Ua TRACE (*)    All other components within normal limits  CBC WITH DIFFERENTIAL/PLATELET    EKG None  Radiology No results found.  Procedures Procedures (including critical care time)  Medications Ordered in UC Medications - No data to display  Initial Impression / Assessment and Plan / UC Course  I have reviewed the triage vital signs and the nursing notes.  Pertinent labs & imaging results that were available during my care of the patient were reviewed by me and considered in my medical decision making (see chart for details).      Pt is an 85 year old female that presents with SOB, leg weakness that has been waxing a waning. She is anxious in the exam room and was caught pacing the halls awaiting lab results.  Her EKG  that was done was similar to previous EKG without any acute changes.  She had a chest x ray a few weeks ago that did not show anything acute. She was dx with allergies at that time and has been taking her anti histamine.  CBC was normal, urine normal and CMP similar to previous with slightly higher creatinine and glucose. She may be slightly dehydrated.  Will call pt and make aware. Glucose was 265 and I did not see any  diabetes medication on her list.  Nothing concerning on exam and vital stable. She was mildly tachy but no dyspnea or distress.  No concern for ACS, DVT or PE.  I believe that most likely this is related to anxiety and mild dehydration.  While she was here I recommended taking the Xanax as needed if this helps. I will call patient and talk to her about her blood sugar and mild dehydration and recommend she follows up with her doctor for recheck.  Final Clinical Impressions(s) / UC Diagnoses   Final diagnoses:  SOB (shortness of breath)     Discharge Instructions     Your EKG was unchanged from previous. We are still waiting on your blood results.  I will call you with any critical or abnormal results. I believe your symptoms may be related to anxiety Follow-up with your doctor in the next few days. If your symptoms continue or worsen you need to go to the hospital.     ED Prescriptions    None     Controlled Substance Prescriptions [place] Controlled Substance Registry consulted? Not Applicable   Orvan July, NP 04/28/19 1409

## 2019-04-28 NOTE — Discharge Instructions (Addendum)
Your EKG was unchanged from previous. We are still waiting on your blood results.  I will call you with any critical or abnormal results. I believe your symptoms may be related to anxiety Follow-up with your doctor in the next few days. If your symptoms continue or worsen you need to go to the hospital.

## 2019-04-28 NOTE — ED Triage Notes (Signed)
Patient complains of sob that started Monday.  Sob worsens at night.  Denies pain.  Patient keeps speaking of being "hot" .  Patient also complains of legs being weak.  Leg weakness for 2 weeks.

## 2019-04-29 ENCOUNTER — Telehealth (HOSPITAL_COMMUNITY): Payer: Self-pay | Admitting: Emergency Medicine

## 2019-04-29 NOTE — Telephone Encounter (Signed)
Called patient with blood test results, patient states her PCP took her off her diabetes medicine. Per Traci, pt needs follow up with PCP for blood sugar levels and kidney function. Pt agreeable to plan.

## 2019-05-11 ENCOUNTER — Telehealth: Payer: Self-pay | Admitting: Cardiology

## 2019-05-11 NOTE — Progress Notes (Signed)
Virtual Visit via Video Note changed to phone visit at patient request as no smart phone.   This visit type was conducted due to national recommendations for restrictions regarding the COVID-19 Pandemic (e.g. social distancing) in an effort to limit this patient's exposure and mitigate transmission in our community.  Due to her co-morbid illnesses, this patient is at least at moderate risk for complications without adequate follow up.  This format is felt to be most appropriate for this patient at this time.  All issues noted in this document were discussed and addressed.  A limited physical exam was performed with this format.  Please refer to the patient's chart for her consent to telehealth for [facility].   Date:  05/13/2019   ID:  Krista Rosales, DOB 1934-06-22, MRN [id number]  Patient Location: Home Provider Location: Home  PCP:  Josetta Huddle, MD  Cardiologist:  Kirk Ruths, MD   Evaluation Performed:  Follow-Up Visit  Chief Complaint:  FU CP  History of Present Illness:    FU chest pain.Monitor November 2018 showed sinus to sinus bradycardia with rare PAC and PVC. Nuclear study January 2019 showed ejection fraction 46% and no ischemia or infarction. Ejection fraction felt better calculated number. Echocardiogram in January 2020 showed normal LV function, moderate left ventricular hypertrophy, mild diastolic dysfunction, mild mitral regurgitation.  Seen in the emergency room with dyspnea in April and May.  Chest xray negative. BUN/Cr 36/2.19. Since last seen the patient has dyspnea with more extreme activities but not with routine activities. It is relieved with rest. It is not associated with chest pain. There is no orthopnea, PND or pedal edema. There is no syncope or palpitations. There is no exertional chest pain.   The patient does not have symptoms concerning for COVID-19 infection (fever, chills, cough, or new shortness of breath).    Past Medical History:   Diagnosis Date  . ANEMIA, IRON DEFICIENCY 05/07/2009   Qualifier: Diagnosis of  By: Loanne Drilling MD, Jacelyn Pi   . ARTHRITIS 05/30/2008   Qualifier: Diagnosis of  By: Marland Mcalpine    . DIABETES MELLITUS, TYPE II 07/16/2007   Qualifier: Diagnosis of  By: Marca Ancona RMA, Lucy    . Diastolic congestive heart failure ([place]) 09/2017  . DIVERTICULOSIS, COLON 05/30/2008   Qualifier: Diagnosis of  By: Marland Mcalpine    . Dyspnea   . Glaucoma   . HYPERLIPIDEMIA 07/16/2007   Qualifier: Diagnosis of  By: Reatha Armour, Lucy    . Hypertension   . Normocytic anemia    Past Surgical History:  Procedure Laterality Date  . ABDOMINAL HYSTERECTOMY       Current Meds  Medication Sig  . acetaminophen (TYLENOL) 500 MG tablet Take 500 mg by mouth every 6 (six) hours as needed.  . ALPRAZolam (XANAX) 0.25 MG tablet Take 0.25 mg by mouth daily as needed.  Marland Kitchen amLODipine (NORVASC) 10 MG tablet Take 10 mg by mouth daily.  Marland Kitchen aspirin 81 MG tablet Take 81 mg by mouth daily.  . cholecalciferol (VITAMIN D) 1000 UNITS tablet Take 2,000 Units by mouth daily.   . cloNIDine (CATAPRES - DOSED IN MG/24 HR) 0.3 mg/24hr patch Place 1 patch (0.3 mg total) onto the skin every 7 (seven) days. friday  . furosemide (LASIX) 20 MG tablet One tablet once daily as needed for swelling  . hydrALAZINE (APRESOLINE) 100 MG tablet Take 1 tablet (100 mg total) by mouth 3 (three) times daily.  Marland Kitchen loratadine (CLARITIN) 5 MG/5ML  syrup Take 5 mLs (5 mg total) by mouth daily.  . vitamin B-12 (CYANOCOBALAMIN) 500 MCG tablet Take 500 mcg by mouth daily.     Allergies:   Wellbutrin [bupropion]; Codeine; and Penicillins   Social History   Tobacco Use  . Smoking status: Former Research scientist (life sciences)  . Smokeless tobacco: Never Used  Substance Use Topics  . Alcohol use: No  . Drug use: No     Family Hx: The patient's family history includes CAD in her brother; Diabetes Mellitus II in an other family member; Heart disease in her father; Pancreatic cancer in her  mother.  ROS:   Please see the history of present illness.    No fevers, chills or productive cough. All other systems reviewed and are negative.  Recent Labs: 04/28/2019: ALT 10; BUN 36; Creatinine, Ser 2.19; Hemoglobin 12.0; Platelets 338; Potassium 4.6; Sodium 135   Recent Lipid Panel Lab Results  Component Value Date/Time   CHOL 153 11/20/2017 03:06 PM   TRIG 66 11/20/2017 03:06 PM   HDL 40 (L) 11/20/2017 03:06 PM   CHOLHDL 3.8 11/20/2017 03:06 PM   LDLCALC 100 (H) 11/20/2017 03:06 PM    Wt Readings from Last 3 Encounters:  05/13/19 143 lb (64.9 kg)  04/18/19 144 lb (65.3 kg)  11/15/18 161 lb (73 kg)     Objective:    Vital Signs:  BP 129/69   Pulse 92   Ht 5\' 9"  (1.753 m)   Wt 143 lb (64.9 kg)   BMI 21.12 kg/m    VITAL SIGNS:  reviewed  No acute distress Normal affect Answers questions appropriately Remainder of physical examination not performed (telehealth visit; coronavirus pandemic)  ASSESSMENT & PLAN:    1. Chronic diastolic congestive heart failure-by history volume status is stable.  Continue present dose of diuretic.  Continue fluid restriction and low-sodium diet. 2. Hypertension-patient's blood pressure is controlled.  Continue present medications and follow. 3. History of moderate mitral regurgitation-most recent echocardiogram showed mild MR. 4. History of chest pain-no recent chest pain.  Nuclear study previously showed no ischemia. 5. Chronic stage III kidney disease-followed by nephrology.  COVID-19 Education: The importance of social distancing was discussed today.  Time:   Today, I have spent 15 minutes with the patient with telehealth technology discussing the above problems.     Medication Adjustments/Labs and Tests Ordered: Current medicines are reviewed at length with the patient today.  Concerns regarding medicines are outlined above.   Tests Ordered: No orders of the defined types were placed in this encounter.   Medication  Changes: No orders of the defined types were placed in this encounter.   Disposition:  Follow up in 6 month(s)  Signed, Kirk Ruths, MD  05/13/2019 7:58 AM    [facility]

## 2019-05-12 NOTE — Telephone Encounter (Signed)
home phone/ consent/ my chart declined/ pre reg completed °

## 2019-05-13 ENCOUNTER — Encounter: Payer: Self-pay | Admitting: Cardiology

## 2019-05-13 ENCOUNTER — Telehealth (INDEPENDENT_AMBULATORY_CARE_PROVIDER_SITE_OTHER): Payer: Medicare HMO | Admitting: Cardiology

## 2019-05-13 ENCOUNTER — Other Ambulatory Visit: Payer: Self-pay

## 2019-05-13 VITALS — BP 129/69 | HR 92 | Ht 69.0 in | Wt 143.0 lb

## 2019-05-13 DIAGNOSIS — I5032 Chronic diastolic (congestive) heart failure: Secondary | ICD-10-CM | POA: Diagnosis not present

## 2019-05-13 DIAGNOSIS — N289 Disorder of kidney and ureter, unspecified: Secondary | ICD-10-CM

## 2019-05-13 DIAGNOSIS — I10 Essential (primary) hypertension: Secondary | ICD-10-CM

## 2019-05-13 NOTE — Patient Instructions (Signed)

## 2019-05-17 DIAGNOSIS — F411 Generalized anxiety disorder: Secondary | ICD-10-CM | POA: Diagnosis not present

## 2019-05-17 DIAGNOSIS — R1310 Dysphagia, unspecified: Secondary | ICD-10-CM | POA: Diagnosis not present

## 2019-05-17 DIAGNOSIS — E1121 Type 2 diabetes mellitus with diabetic nephropathy: Secondary | ICD-10-CM | POA: Diagnosis not present

## 2019-05-17 DIAGNOSIS — R438 Other disturbances of smell and taste: Secondary | ICD-10-CM | POA: Diagnosis not present

## 2019-05-17 DIAGNOSIS — E1142 Type 2 diabetes mellitus with diabetic polyneuropathy: Secondary | ICD-10-CM | POA: Diagnosis not present

## 2019-05-17 DIAGNOSIS — I509 Heart failure, unspecified: Secondary | ICD-10-CM | POA: Diagnosis not present

## 2019-05-17 DIAGNOSIS — Z7984 Long term (current) use of oral hypoglycemic drugs: Secondary | ICD-10-CM | POA: Diagnosis not present

## 2019-05-17 DIAGNOSIS — I10 Essential (primary) hypertension: Secondary | ICD-10-CM | POA: Diagnosis not present

## 2019-05-22 DIAGNOSIS — E1165 Type 2 diabetes mellitus with hyperglycemia: Secondary | ICD-10-CM | POA: Diagnosis not present

## 2019-05-22 DIAGNOSIS — I509 Heart failure, unspecified: Secondary | ICD-10-CM | POA: Diagnosis not present

## 2019-05-22 DIAGNOSIS — I10 Essential (primary) hypertension: Secondary | ICD-10-CM | POA: Diagnosis not present

## 2019-05-22 DIAGNOSIS — J449 Chronic obstructive pulmonary disease, unspecified: Secondary | ICD-10-CM | POA: Diagnosis not present

## 2019-05-22 DIAGNOSIS — E785 Hyperlipidemia, unspecified: Secondary | ICD-10-CM | POA: Diagnosis not present

## 2019-05-22 DIAGNOSIS — E119 Type 2 diabetes mellitus without complications: Secondary | ICD-10-CM | POA: Diagnosis not present

## 2019-05-22 DIAGNOSIS — D649 Anemia, unspecified: Secondary | ICD-10-CM | POA: Diagnosis not present

## 2019-05-22 DIAGNOSIS — E1142 Type 2 diabetes mellitus with diabetic polyneuropathy: Secondary | ICD-10-CM | POA: Diagnosis not present

## 2019-05-22 DIAGNOSIS — E7849 Other hyperlipidemia: Secondary | ICD-10-CM | POA: Diagnosis not present

## 2019-05-25 DIAGNOSIS — I110 Hypertensive heart disease with heart failure: Secondary | ICD-10-CM | POA: Diagnosis not present

## 2019-05-25 DIAGNOSIS — E1142 Type 2 diabetes mellitus with diabetic polyneuropathy: Secondary | ICD-10-CM | POA: Diagnosis not present

## 2019-05-25 DIAGNOSIS — E1121 Type 2 diabetes mellitus with diabetic nephropathy: Secondary | ICD-10-CM | POA: Diagnosis not present

## 2019-05-25 DIAGNOSIS — E1165 Type 2 diabetes mellitus with hyperglycemia: Secondary | ICD-10-CM | POA: Diagnosis not present

## 2019-05-25 DIAGNOSIS — I509 Heart failure, unspecified: Secondary | ICD-10-CM | POA: Diagnosis not present

## 2019-05-25 DIAGNOSIS — I10 Essential (primary) hypertension: Secondary | ICD-10-CM | POA: Diagnosis not present

## 2019-05-25 DIAGNOSIS — R1310 Dysphagia, unspecified: Secondary | ICD-10-CM | POA: Diagnosis not present

## 2019-05-25 DIAGNOSIS — F411 Generalized anxiety disorder: Secondary | ICD-10-CM | POA: Diagnosis not present

## 2019-05-25 DIAGNOSIS — R438 Other disturbances of smell and taste: Secondary | ICD-10-CM | POA: Diagnosis not present

## 2019-05-25 DIAGNOSIS — N289 Disorder of kidney and ureter, unspecified: Secondary | ICD-10-CM | POA: Diagnosis not present

## 2019-06-15 DIAGNOSIS — I509 Heart failure, unspecified: Secondary | ICD-10-CM | POA: Diagnosis not present

## 2019-06-15 DIAGNOSIS — E7849 Other hyperlipidemia: Secondary | ICD-10-CM | POA: Diagnosis not present

## 2019-06-15 DIAGNOSIS — E1142 Type 2 diabetes mellitus with diabetic polyneuropathy: Secondary | ICD-10-CM | POA: Diagnosis not present

## 2019-06-15 DIAGNOSIS — J449 Chronic obstructive pulmonary disease, unspecified: Secondary | ICD-10-CM | POA: Diagnosis not present

## 2019-06-15 DIAGNOSIS — D649 Anemia, unspecified: Secondary | ICD-10-CM | POA: Diagnosis not present

## 2019-06-15 DIAGNOSIS — I10 Essential (primary) hypertension: Secondary | ICD-10-CM | POA: Diagnosis not present

## 2019-06-15 DIAGNOSIS — E1165 Type 2 diabetes mellitus with hyperglycemia: Secondary | ICD-10-CM | POA: Diagnosis not present

## 2019-06-30 ENCOUNTER — Ambulatory Visit (INDEPENDENT_AMBULATORY_CARE_PROVIDER_SITE_OTHER): Payer: Medicare HMO | Admitting: Physician Assistant

## 2019-06-30 ENCOUNTER — Telehealth: Payer: Self-pay | Admitting: Cardiology

## 2019-06-30 ENCOUNTER — Other Ambulatory Visit: Payer: Self-pay

## 2019-06-30 ENCOUNTER — Encounter: Payer: Self-pay | Admitting: Physician Assistant

## 2019-06-30 VITALS — BP 181/78 | HR 81 | Ht 69.0 in | Wt 146.0 lb

## 2019-06-30 DIAGNOSIS — I5032 Chronic diastolic (congestive) heart failure: Secondary | ICD-10-CM | POA: Diagnosis not present

## 2019-06-30 DIAGNOSIS — I10 Essential (primary) hypertension: Secondary | ICD-10-CM | POA: Diagnosis not present

## 2019-06-30 DIAGNOSIS — R001 Bradycardia, unspecified: Secondary | ICD-10-CM | POA: Diagnosis not present

## 2019-06-30 NOTE — Telephone Encounter (Signed)
Called patient she states that her HR has been running really low- 37, 38 at times. She states her BP has been okay, this morning was 177/66 HR 54. She states she is weak feeling and dizzy at times, she also says she has a bitter taste in her mouth that does not go away- her PCP said it could be her acid reflux but patient does not think so. She states she is taking everything on her med list currently as well as spironolactone that I did not see on her list, she takes 50 mg 0.5 tablet every day. She denies CP, SOB, or swelling at this time.  Please advise on low heart rate. Thank you!

## 2019-06-30 NOTE — Telephone Encounter (Signed)
New message:     Patient calling concering her HR being very low. Patient little weak and would like for some one to call.

## 2019-06-30 NOTE — Patient Instructions (Signed)
Medication Instructions:  Your physician recommends that you continue on your current medications as directed. Please refer to the Current Medication list given to you today.  If you need a refill on your cardiac medications before your next appointment, please call your pharmacy.    Testing/Procedures: Your physician has recommended that you wear a 14 day ZIO event monitor. Event monitors are medical devices that record the heart's electrical activity. Doctors most often [place] these monitors to diagnose arrhythmias. Arrhythmias are problems with the speed or rhythm of the heartbeat. The monitor is a small, portable device. You can wear one while you do your normal daily activities. This is usually used to diagnose what is causing palpitations/syncope (passing out). This monitor will be mailed to you with instructions. If you need assistance with your monitor, you can call our [facility] office at [phone].  Follow-Up: At [facility], you and your health needs are our priority.  As part of our continuing mission to provide you with exceptional heart care, we have created designated Provider Care Teams.  These Care Teams include your primary Cardiologist (physician) and Advanced Practice Providers (APPs -  Physician Assistants and Nurse Practitioners) who all work together to provide you with the care you need, when you need it. . You have been scheduled for a follow-up appointment with Dr. Stanford Breed on Tuesday, 07/16/19 at 2:40 PM. . You have been scheduled for an appointment with our Pharmacist in Hypertension Clinic on Thursday, 07/14/19 at 8:00 AM.

## 2019-06-30 NOTE — Telephone Encounter (Signed)
Scheduled with Rosaria Ferries, PA for today at 1:45-  Will route to assistant to make aware.

## 2019-06-30 NOTE — Telephone Encounter (Signed)
APP ov Krista Rosales   

## 2019-06-30 NOTE — Progress Notes (Signed)
Cardiology Office Note   Date:  06/30/2019   ID:  Krista Rosales, DOB [birth year], MRN [id number]  PCP:  Krista Huddle, MD Cardiologist:  Kirk Ruths, MD 05/13/2019 telehealth visit Rosaria Ferries, PA-C   No chief complaint on file.   History of Present Illness: Krista Rosales is a 85 y.o. female with a history of D-CHF, DM, HTN, HLD, anemia, mod MR, CKD III, nl MV 2019, sinus bradycardia in the 40s with pauses up to 4.2 seconds 41/2878>> Bystolic DC'd  67/67 telehealth visit, volume status stable, weight 143 pounds, blood pressure controlled 07/09 phone notes regarding bradycardia and weakness, appointment made  Rod Can presents for cardiology follow up.  She has been having problems w/ low HR for about a week. Her HR has been in the 40s & 50s. When her HR is low, her legs feel weak.   Sometimes, she feels dizzy and light-headed, orthostatic in nature.  She has not had any chest pain  She normally is able to walk 6 blocks every day. She has been able to walk, even though her HR was low. However, she did notice the leg weakness and felt she had a harder time finishing the walk than usual.  She has not been waking up with lower extremity edema, no orthopnea or PND. Has not noticed any weight change.   Her BP has never been controlled. She takes her meds as directed. SBP is always >160. Goes up when she comes to the Dr or gets upset.    Past Medical History:  Diagnosis Date   ANEMIA, IRON DEFICIENCY 05/07/2009   Qualifier: Diagnosis of  By: Loanne Drilling MD, Sean A    ARTHRITIS 05/30/2008   Qualifier: Diagnosis of  By: [place], TYPE II 07/16/2007   Qualifier: Diagnosis of  By: Marca Ancona RMA, Lucy     Diastolic congestive heart failure ([place]) 09/2017   DIVERTICULOSIS, COLON 05/30/2008   Qualifier: Diagnosis of  By: Marland Mcalpine     Dyspnea    Glaucoma    HYPERLIPIDEMIA 07/16/2007   Qualifier: Diagnosis of  By: Marca Ancona RMA,  Lucy     Hypertension    Normocytic anemia     Past Surgical History:  Procedure Laterality Date   ABDOMINAL HYSTERECTOMY      Current Outpatient Medications  Medication Sig Dispense Refill   acetaminophen (TYLENOL) 500 MG tablet Take 500 mg by mouth every 6 (six) hours as needed.     ALPRAZolam (XANAX) 0.25 MG tablet Take 0.25 mg by mouth daily as needed.  0   amLODipine (NORVASC) 10 MG tablet Take 10 mg by mouth daily.     aspirin 81 MG tablet Take 81 mg by mouth daily.     cholecalciferol (VITAMIN D) 1000 UNITS tablet Take 2,000 Units by mouth daily.      cloNIDine (CATAPRES - DOSED IN MG/24 HR) 0.3 mg/24hr patch Place 1 patch (0.3 mg total) onto the skin every 7 (seven) days. friday     furosemide (LASIX) 20 MG tablet One tablet once daily as needed for swelling 30 tablet 4   hydrALAZINE (APRESOLINE) 100 MG tablet Take 1 tablet (100 mg total) by mouth 3 (three) times daily.     loratadine (CLARITIN) 5 MG/5ML syrup Take 5 mLs (5 mg total) by mouth daily. 60 mL 0   spironolactone (ALDACTONE) 50 MG tablet Take 25 mg by mouth daily.     vitamin B-12 (CYANOCOBALAMIN) 500  MCG tablet Take 500 mcg by mouth daily.     No current facility-administered medications for this visit.     Allergies:   Wellbutrin [bupropion], Codeine, and Penicillins    Social History:  The patient  reports that she has quit smoking. She has never used smokeless tobacco. She reports that she does not drink alcohol or use drugs.   Family History:  The patient's family history includes CAD in her brother; Diabetes Mellitus II in an other family member; Heart disease in her father; Pancreatic cancer in her mother.  She indicated that her mother is deceased. She indicated that her father is deceased. She indicated that her brother is deceased. She indicated that her maternal grandmother is deceased. She indicated that her maternal grandfather is deceased. She indicated that her paternal grandmother is  deceased. She indicated that her paternal grandfather is deceased. She indicated that the status of her other is unknown.   ROS:  Please see the history of present illness. All other systems are reviewed and negative.    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 GEN: Well nourished, well developed, female in no acute distress HEENT: normal for age  Neck: no JVD, bilateral carotid bruits, no masses Cardiac: RRR; 2/6 murmur, no rubs, or gallops Respiratory:  clear to auscultation bilaterally, normal work of breathing GI: soft, nontender, nondistended, + BS MS: no deformity or atrophy; no edema; distal pulses are 2+ in all 4 extremities  Skin: warm and dry, no rash Neuro:  Strength and sensation are intact Psych: euthymic mood, full affect   EKG:  EKG is ordered today. The ekg ordered today demonstrates sinus rhythm, heart rate 87, first-degree AV block with PR interval 290 ms (unchanged), no acute ischemic changes  ECHO: 01/04/2019 - Left ventricle: The cavity size was normal. Wall thickness was   increased in a pattern of moderate LVH. Systolic function was   normal. The estimated ejection fraction was in the range of 55%   to 60%. Wall motion was normal; there were no regional wall   motion abnormalities. Doppler parameters are consistent with   abnormal left ventricular relaxation (grade 1 diastolic   dysfunction). - Mitral valve: Calcified annulus. There was mild regurgitation.  Impressions:  - Normal LV systolic function; moderate LVH; mild diastolic   dysfunction; mild MR.  MYOVIEW: 12/29/2017  Nuclear stress EF: 46%.  The left ventricular ejection fraction is mildly decreased (45-54%).  There was no ST segment deviation noted during stress.  The study is  normal.  This is a low risk study.   Normal pharmacologic nuclear stress test with no evidence for prior infarct or ischemia. LVEF appears visually better than calculated.    Recent Labs: 04/28/2019: ALT 10; BUN 36; Creatinine, Ser 2.19; Hemoglobin 12.0; Platelets 338; Potassium 4.6; Sodium 135  CBC    Component Value Date/Time   WBC 6.9 04/28/2019 1105   RBC 4.16 04/28/2019 1105   HGB 12.0 04/28/2019 1105   HCT 36.3 04/28/2019 1105   PLT 338 04/28/2019 1105   MCV 87.3 04/28/2019 1105   MCH 28.8 04/28/2019 1105   MCHC 33.1 04/28/2019 1105   RDW 13.1 04/28/2019 1105   LYMPHSABS 0.8 04/28/2019 1105  MONOABS 0.5 04/28/2019 1105   EOSABS 0.1 04/28/2019 1105   BASOSABS 0.0 04/28/2019 1105   CMP Latest Ref Rng & Units 04/28/2019 12/06/2018 11/15/2018  Glucose 70 - 99 mg/dL 265(H) 122(H) 186(H)  BUN 8 - 23 mg/dL 36(H) 28(H) 35(H)  Creatinine 0.44 - 1.00 mg/dL 2.19(H) 1.74(H) 1.96(H)  Sodium 135 - 145 mmol/L 135 140 139  Potassium 3.5 - 5.1 mmol/L 4.6 5.1 4.7  Chloride 98 - 111 mmol/L 102 105 101  CO2 22 - 32 mmol/L 20(L) 17(L) 19(L)  Calcium 8.9 - 10.3 mg/dL 9.6 9.4 9.8  Total Protein 6.5 - 8.1 g/dL 8.1 - -  Total Bilirubin 0.3 - 1.2 mg/dL 0.4 - -  Alkaline Phos 38 - 126 U/L 39 - -  AST 15 - 41 U/L 13(L) - -  ALT 0 - 44 U/L 10 - -     Lipid Panel Lab Results  Component Value Date   CHOL 153 11/20/2017   HDL 40 (L) 11/20/2017   LDLCALC 100 (H) 11/20/2017   TRIG 66 11/20/2017   CHOLHDL 3.8 11/20/2017      Wt Readings from Last 3 Encounters:  06/30/19 146 lb (66.2 kg)  05/13/19 143 lb (64.9 kg)  04/18/19 144 lb (65.3 kg)     Other studies Reviewed: Additional studies/ records that were reviewed today include: Office notes, hospital records and testing.  ASSESSMENT AND PLAN:  1.  Bradycardia: - She had problems with this previously, noticed when she was hospitalized in 09/2017 for CHF.  Bystolic was discontinued at that time and she has not been on any rate  lowering medication since then. - Clonidine has the potential to cause bradycardia, she is on a high-dose patch along with other medications and her blood pressure is still not controlled.  Therefore, I am reluctant to discontinue it. -As her heart rate has been greater than 45, she has no presyncope or syncope, continue the clonidine for now - Get a 2-week event monitor, and follow-up on results -If her symptoms worsen, or she feels in danger of passing out or falling, she is to call 911.  2.  Hypertension: - She states she is compliant with Norvasc 10 mg daily, hydralazine 100 mg 3 times daily, spironolactone 50 mg daily and clonidine 0.3 mg patch -If the clonidine is felt to be causing her bradycardia, may have to discontinue it but I do not have a substitute, continue it for now - Refer to the Hypertension Clinic for further blood pressure management  3.  Chronic diastolic CHF: - Her weight is up a couple of pounds, but her volume status is stable by exam. - Encourage her to follow a low-sodium diet and continue to take the Lasix as needed  4.  Carotid bruits: - There possibly radiation of her murmur, but they are loud enough, I am concerned. - Currently, she is having no TIA/CVA symptoms - Dr. Stanford Breed to review when she sees him in follow-up and decide on carotid Dopplers.   Current medicines are reviewed at length with the patient today.  The patient does not have concerns regarding medicines.  The following changes have been made:  no change  Labs/ tests ordered today include:  No orders of the defined types were placed in this encounter.    Disposition:   FU with Kirk Ruths, MD  Signed, Rosaria Ferries, PA-C  06/30/2019 1:52 PM    [facility] Phone: [phone]; Fax: [phone]

## 2019-07-01 ENCOUNTER — Telehealth: Payer: Self-pay | Admitting: Radiology

## 2019-07-01 NOTE — Telephone Encounter (Signed)
Enrolled patient for a 14 day Zio Telemetry monitor to be mailed. Brief instructions were gone over with patient and she knows to expect the monitor to arrive in 3-4 days

## 2019-07-04 DIAGNOSIS — H43823 Vitreomacular adhesion, bilateral: Secondary | ICD-10-CM | POA: Diagnosis not present

## 2019-07-04 DIAGNOSIS — E113593 Type 2 diabetes mellitus with proliferative diabetic retinopathy without macular edema, bilateral: Secondary | ICD-10-CM | POA: Diagnosis not present

## 2019-07-04 DIAGNOSIS — H35372 Puckering of macula, left eye: Secondary | ICD-10-CM | POA: Diagnosis not present

## 2019-07-04 DIAGNOSIS — H3582 Retinal ischemia: Secondary | ICD-10-CM | POA: Diagnosis not present

## 2019-07-05 ENCOUNTER — Telehealth: Payer: Self-pay | Admitting: Physician Assistant

## 2019-07-05 NOTE — Telephone Encounter (Signed)
New Message    Patient has monitor and doesn't know how to put it on would like to come in and have someone put it one her.  Please call patient back.

## 2019-07-06 ENCOUNTER — Other Ambulatory Visit (INDEPENDENT_AMBULATORY_CARE_PROVIDER_SITE_OTHER): Payer: Medicare HMO

## 2019-07-06 DIAGNOSIS — R001 Bradycardia, unspecified: Secondary | ICD-10-CM

## 2019-07-12 ENCOUNTER — Telehealth: Payer: Self-pay | Admitting: Cardiology

## 2019-07-12 NOTE — Telephone Encounter (Signed)
Paged by [facility] that patient has had multiple episodes of 3s "pauses" in the setting of intermittent bradycardia to the low 30s. All appears to be sinus bradycardia without evidence of higher grade conduction disease. Patient was called this evening at [facility] after recurrent "pauses" and was asymptomatic.   Will notify Dr. Stanford Breed.   Margurette Brener K. Marletta Lor, MD

## 2019-07-12 NOTE — Telephone Encounter (Signed)
This encounter was created in error - please disregard.

## 2019-07-13 ENCOUNTER — Other Ambulatory Visit: Payer: Self-pay

## 2019-07-13 ENCOUNTER — Telehealth: Payer: Self-pay

## 2019-07-13 ENCOUNTER — Telehealth: Payer: Self-pay | Admitting: Cardiology

## 2019-07-13 MED ORDER — CLONIDINE 0.1 MG/24HR TD PTWK: 0.1000 mg | MEDICATED_PATCH | TRANSDERMAL | 0 refills | Status: DC

## 2019-07-13 MED ORDER — DOXAZOSIN MESYLATE 2 MG PO TABS: 2.0000 mg | ORAL_TABLET | Freq: Every day | ORAL | 3 refills | Status: DC

## 2019-07-13 NOTE — Telephone Encounter (Signed)
Change clonidine patch to 0.1 mg daily for 3 days and then DC; add cardura 2 mg daily; follow HR and BP; continue monitor; please let us know with any dizziness or presyncope; schedule fu with Suanne Marker Barrett 1 week; may need EP eval for PM if HR remains low despite weaning clonidine to off [employer]

## 2019-07-13 NOTE — Addendum Note (Signed)
Addended by: Cristopher Estimable on: 07/13/2019 08:46 AM   Modules accepted: Orders

## 2019-07-13 NOTE — Telephone Encounter (Signed)
The patient called and wanted to know if her medication had been sent over to the pharmacy yet.  She said she got a call from the office this morning, and on that call she was told that medication would be sent over to her pharmacy. She has gone to the pharmacy 2x today, and the pharmacy states they have not received anything yet either. Please make sure to send it to the [facility] at the corner of [address] and [address].   The patient also states that she does not know how to check her voicemail on her cell phone,

## 2019-07-13 NOTE — Telephone Encounter (Signed)
Received a call from pharmacist at [facility] to confirm patient is to receive 1 Clonidine 0.1 mg patch.Advised pt is to wear Clonidine 0.1 mg patch for 3 days then stop.She is to start taking Cardura 2 mg daily. Spoke with patient she stated she understands to wear Clonidine 0.1 mg for 3 days then stop.She knows to start taking Cardura 2 mg daily.She stated she has been waiting on prescriptions all day.Stated she does not want to take all this medication for her B/P.Stated she does not think she needs to take.Advised to take medications as prescribed.Advised to monitor B/P.Keep appointment as planned with Almyra Deforest PA 7/30 at 9:00 am.Advised to call sooner if needed.

## 2019-07-13 NOTE — Telephone Encounter (Signed)
Pt informed that clonidine was sent to [facility] but doxazosin was sent to [facility]. Nurse sent new Rx of doxazosin to [facility]. Pt voiced understanding.

## 2019-07-13 NOTE — Telephone Encounter (Signed)
Spoke with pt, Patient voiced understanding of medication changes.Follow up scheduled next week. Patient will track her bp and bring to the appointment.

## 2019-07-14 ENCOUNTER — Ambulatory Visit: Payer: Medicare HMO

## 2019-07-14 DIAGNOSIS — E785 Hyperlipidemia, unspecified: Secondary | ICD-10-CM | POA: Diagnosis not present

## 2019-07-14 DIAGNOSIS — J449 Chronic obstructive pulmonary disease, unspecified: Secondary | ICD-10-CM | POA: Diagnosis not present

## 2019-07-14 DIAGNOSIS — I10 Essential (primary) hypertension: Secondary | ICD-10-CM | POA: Diagnosis not present

## 2019-07-14 DIAGNOSIS — I509 Heart failure, unspecified: Secondary | ICD-10-CM | POA: Diagnosis not present

## 2019-07-14 DIAGNOSIS — E1165 Type 2 diabetes mellitus with hyperglycemia: Secondary | ICD-10-CM | POA: Diagnosis not present

## 2019-07-14 DIAGNOSIS — E7849 Other hyperlipidemia: Secondary | ICD-10-CM | POA: Diagnosis not present

## 2019-07-14 DIAGNOSIS — E119 Type 2 diabetes mellitus without complications: Secondary | ICD-10-CM | POA: Diagnosis not present

## 2019-07-14 DIAGNOSIS — E1142 Type 2 diabetes mellitus with diabetic polyneuropathy: Secondary | ICD-10-CM | POA: Diagnosis not present

## 2019-07-14 DIAGNOSIS — D649 Anemia, unspecified: Secondary | ICD-10-CM | POA: Diagnosis not present

## 2019-07-15 NOTE — Telephone Encounter (Signed)
Reference#[id number]    Please call,she needs to talk to you and give you an update.

## 2019-07-15 NOTE — Telephone Encounter (Signed)
Returned call to [employer].   Patient reached max threshold for symptomatic triggers on day 8. Any button presses by patient, will receive a report. We will not receive report for autotriggers.   Message forwarded to provider

## 2019-07-21 ENCOUNTER — Other Ambulatory Visit: Payer: Self-pay | Admitting: Cardiology

## 2019-07-21 ENCOUNTER — Ambulatory Visit: Payer: Medicare HMO | Admitting: Physician Assistant

## 2019-07-25 DIAGNOSIS — I509 Heart failure, unspecified: Secondary | ICD-10-CM | POA: Diagnosis not present

## 2019-07-25 DIAGNOSIS — I10 Essential (primary) hypertension: Secondary | ICD-10-CM | POA: Diagnosis not present

## 2019-07-25 DIAGNOSIS — E1142 Type 2 diabetes mellitus with diabetic polyneuropathy: Secondary | ICD-10-CM | POA: Diagnosis not present

## 2019-07-25 DIAGNOSIS — E785 Hyperlipidemia, unspecified: Secondary | ICD-10-CM | POA: Diagnosis not present

## 2019-07-25 DIAGNOSIS — E7849 Other hyperlipidemia: Secondary | ICD-10-CM | POA: Diagnosis not present

## 2019-07-25 DIAGNOSIS — E1165 Type 2 diabetes mellitus with hyperglycemia: Secondary | ICD-10-CM | POA: Diagnosis not present

## 2019-07-25 DIAGNOSIS — E119 Type 2 diabetes mellitus without complications: Secondary | ICD-10-CM | POA: Diagnosis not present

## 2019-07-25 DIAGNOSIS — J449 Chronic obstructive pulmonary disease, unspecified: Secondary | ICD-10-CM | POA: Diagnosis not present

## 2019-07-25 DIAGNOSIS — D649 Anemia, unspecified: Secondary | ICD-10-CM | POA: Diagnosis not present

## 2019-07-29 ENCOUNTER — Telehealth: Payer: Self-pay | Admitting: *Deleted

## 2019-07-29 NOTE — Telephone Encounter (Addendum)
-----   Message from Lelon Perla, MD sent at 07/29/2019  3:30 PM EDT ----- Please make sure pt has fu ov with me next 2-4 weeks and that she is not having presyncopal or syncopal spells Kirk Ruths  Follow up scheduled 08/16/19, Spoke with pt, she denies any syncope or feeling faint.

## 2019-08-15 NOTE — Progress Notes (Signed)
HPI: FU bradycardia. Nuclear study January 2019 showed ejection fraction 46% and no ischemia or infarction. Ejection fraction felt better calculated number. Echocardiogram in January 2020 showed normal LV function, moderate left ventricular hypertrophy, mild diastolic dysfunction, mild mitral regurgitation. Monitor August 2020 showed sinus bradycardia with first-degree AV block, normal sinus rhythm, sinus tachycardia, intermittent Mobitz 1 second-degree AV block, multiple pauses with longest being 3.4 seconds, occasional PAC and PVC.  Clonidine patch weaned to off.  Since last seenshe denies dyspnea, chest pain, palpitations or syncope.  Current Outpatient Medications  Medication Sig Dispense Refill  . acetaminophen (TYLENOL) 500 MG tablet Take 500 mg by mouth every 6 (six) hours as needed.    . ALPRAZolam (XANAX) 0.5 MG tablet Take 0.5 mg by mouth daily as needed.   0  . amLODipine (NORVASC) 10 MG tablet Take 10 mg by mouth daily.    Marland Kitchen aspirin 81 MG tablet Take 81 mg by mouth daily.    . cholecalciferol (VITAMIN D) 1000 UNITS tablet Take 2,000 Units by mouth daily.     Marland Kitchen doxazosin (CARDURA) 2 MG tablet Take 1 tablet (2 mg total) by mouth daily. 90 tablet 3  . furosemide (LASIX) 20 MG tablet One tablet once daily as needed for swelling 30 tablet 4  . hydrALAZINE (APRESOLINE) 100 MG tablet Take 1 tablet (100 mg total) by mouth 3 (three) times daily.    Marland Kitchen loratadine (CLARITIN) 5 MG/5ML syrup Take 5 mLs (5 mg total) by mouth daily. 60 mL 0  . spironolactone (ALDACTONE) 50 MG tablet Take 25 mg by mouth daily.    . vitamin B-12 (CYANOCOBALAMIN) 500 MCG tablet Take 500 mcg by mouth daily.    . pantoprazole (PROTONIX) 40 MG tablet      No current facility-administered medications for this visit.      Past Medical History:  Diagnosis Date  . ANEMIA, IRON DEFICIENCY 05/07/2009   Qualifier: Diagnosis of  By: Loanne Drilling MD, Jacelyn Pi   . ARTHRITIS 05/30/2008   Qualifier: Diagnosis of  By: Marland Mcalpine    . DIABETES MELLITUS, TYPE II 07/16/2007   Qualifier: Diagnosis of  By: Marca Ancona RMA, Lucy    . Diastolic congestive heart failure ([place]) 09/2017  . DIVERTICULOSIS, COLON 05/30/2008   Qualifier: Diagnosis of  By: Marland Mcalpine    . Dyspnea   . Glaucoma   . HYPERLIPIDEMIA 07/16/2007   Qualifier: Diagnosis of  By: Reatha Armour, Lucy    . Hypertension   . Normocytic anemia     Past Surgical History:  Procedure Laterality Date  . ABDOMINAL HYSTERECTOMY      Social History   Socioeconomic History  . Marital status: Widowed    Spouse name: Not on file  . Number of children: Not on file  . Years of education: Not on file  . Highest education level: Not on file  Occupational History  . Not on file  Social Needs  . Financial resource strain: Not on file  . Food insecurity    Worry: Not on file    Inability: Not on file  . Transportation needs    Medical: Not on file    Non-medical: Not on file  Tobacco Use  . Smoking status: Former Research scientist (life sciences)  . Smokeless tobacco: Never Used  Substance and Sexual Activity  . Alcohol use: No  . Drug use: No  . Sexual activity: Not on file  Lifestyle  . Physical activity    Days  per week: Not on file    Minutes per session: Not on file  . Stress: Not on file  Relationships  . Social Herbalist on phone: Not on file    Gets together: Not on file    Attends religious service: Not on file    Active member of club or organization: Not on file    Attends meetings of clubs or organizations: Not on file    Relationship status: Not on file  . Intimate partner violence    Fear of current or ex partner: Not on file    Emotionally abused: Not on file    Physically abused: Not on file    Forced sexual activity: Not on file  Other Topics Concern  . Not on file  Social History Narrative   Widowed.  No children.  Has a brother.      Family History  Problem Relation Age of Onset  . Diabetes Mellitus II Other   . Pancreatic  cancer Mother   . Heart disease Father   . CAD Brother     ROS: no fevers or chills, productive cough, hemoptysis, dysphasia, odynophagia, melena, hematochezia, dysuria, hematuria, rash, seizure activity, orthopnea, PND, pedal edema, claudication. Remaining systems are negative.  Physical Exam: Well-developed well-nourished in no acute distress.  Skin is warm and dry.  HEENT is normal.  Neck is supple.  Chest is clear to auscultation with normal expansion.  Cardiovascular exam is regular rate and rhythm.  Abdominal exam nontender or distended. No masses palpated. Extremities show no edema. neuro grossly intact   A/P  1 Bradycardia-heart rate has improved off of clonidine.  She has no history of dizziness or syncope.  We will follow for recurrent bradycardia.  She would prefer not to repeat monitor at this point.  2 hypertension-blood pressure is controlled.  Continue present medications and follow.  3 history of mitral regurgitation-mild on most recent study.  4 history of chest pain-no recurrence.  Prior nuclear study showed no ischemia.  5 chronic stage III kidney disease-monitored by nephrology.  6 chronic diastolic congestive heart failure-patient appears to be doing well from a symptomatic standpoint.  Continue present dose of diuretic.  Continue fluid restriction and low-sodium diet.  Kirk Ruths, MD

## 2019-08-16 ENCOUNTER — Ambulatory Visit (INDEPENDENT_AMBULATORY_CARE_PROVIDER_SITE_OTHER): Payer: Medicare HMO | Admitting: Cardiology

## 2019-08-16 ENCOUNTER — Other Ambulatory Visit: Payer: Self-pay

## 2019-08-16 ENCOUNTER — Encounter: Payer: Self-pay | Admitting: Cardiology

## 2019-08-16 VITALS — BP 116/70 | HR 86 | Ht 69.0 in | Wt 154.6 lb

## 2019-08-16 DIAGNOSIS — I5032 Chronic diastolic (congestive) heart failure: Secondary | ICD-10-CM

## 2019-08-16 DIAGNOSIS — N183 Chronic kidney disease, stage 3 (moderate): Secondary | ICD-10-CM | POA: Diagnosis not present

## 2019-08-16 DIAGNOSIS — I10 Essential (primary) hypertension: Secondary | ICD-10-CM

## 2019-08-16 DIAGNOSIS — R001 Bradycardia, unspecified: Secondary | ICD-10-CM | POA: Diagnosis not present

## 2019-08-16 NOTE — Patient Instructions (Signed)

## 2019-08-31 DIAGNOSIS — E1142 Type 2 diabetes mellitus with diabetic polyneuropathy: Secondary | ICD-10-CM | POA: Diagnosis not present

## 2019-08-31 DIAGNOSIS — Z0001 Encounter for general adult medical examination with abnormal findings: Secondary | ICD-10-CM | POA: Diagnosis not present

## 2019-08-31 DIAGNOSIS — Z1389 Encounter for screening for other disorder: Secondary | ICD-10-CM | POA: Diagnosis not present

## 2019-08-31 DIAGNOSIS — I10 Essential (primary) hypertension: Secondary | ICD-10-CM | POA: Diagnosis not present

## 2019-08-31 DIAGNOSIS — E119 Type 2 diabetes mellitus without complications: Secondary | ICD-10-CM | POA: Diagnosis not present

## 2019-08-31 DIAGNOSIS — E559 Vitamin D deficiency, unspecified: Secondary | ICD-10-CM | POA: Diagnosis not present

## 2019-08-31 DIAGNOSIS — I509 Heart failure, unspecified: Secondary | ICD-10-CM | POA: Diagnosis not present

## 2019-08-31 DIAGNOSIS — J449 Chronic obstructive pulmonary disease, unspecified: Secondary | ICD-10-CM | POA: Diagnosis not present

## 2019-08-31 DIAGNOSIS — D649 Anemia, unspecified: Secondary | ICD-10-CM | POA: Diagnosis not present

## 2019-08-31 DIAGNOSIS — E1165 Type 2 diabetes mellitus with hyperglycemia: Secondary | ICD-10-CM | POA: Diagnosis not present

## 2019-08-31 DIAGNOSIS — E785 Hyperlipidemia, unspecified: Secondary | ICD-10-CM | POA: Diagnosis not present

## 2019-08-31 DIAGNOSIS — Z23 Encounter for immunization: Secondary | ICD-10-CM | POA: Diagnosis not present

## 2019-08-31 DIAGNOSIS — E1121 Type 2 diabetes mellitus with diabetic nephropathy: Secondary | ICD-10-CM | POA: Diagnosis not present

## 2019-10-10 ENCOUNTER — Ambulatory Visit (HOSPITAL_COMMUNITY)
Admission: EM | Admit: 2019-10-10 | Discharge: 2019-10-10 | Disposition: A | Discharge status: Home / Self Care | Payer: Medicare HMO | Attending: Internal Medicine | Admitting: Internal Medicine

## 2019-10-10 ENCOUNTER — Other Ambulatory Visit: Payer: Self-pay

## 2019-10-10 ENCOUNTER — Encounter (HOSPITAL_COMMUNITY): Payer: Self-pay

## 2019-10-10 DIAGNOSIS — R432 Parageusia: Secondary | ICD-10-CM | POA: Diagnosis not present

## 2019-10-10 MED ORDER — PANTOPRAZOLE SODIUM 40 MG PO TBEC: 40.0000 mg | DELAYED_RELEASE_TABLET | Freq: Every day | ORAL | Status: DC

## 2019-10-10 NOTE — ED Provider Notes (Signed)
[facility]    CSN: [id number] Arrival date & time: 10/10/19  1543      History   Chief Complaint Chief Complaint  Patient presents with  . mouth problem    HPI Krista Rosales is a 85 y.o. female with a history of diabetes mellitus type 2-controlled, hypertension-suboptimally controlled comes to urgent care with complaints of sour taste in her mouth.  Symptoms have been ongoing for the past 2 months and is intermittent in nature.  She was seen by primary care physician and prescribed Protonix but she has not been taking her medications regularly.  Patient admits to having sour taste in the mouth with associated regurgitation of food previously eaten.  These events are sporadic.  She denies any weight loss.  No known relieving factors.   HPI  Past Medical History:  Diagnosis Date  . ANEMIA, IRON DEFICIENCY 05/07/2009   Qualifier: Diagnosis of  By: Loanne Drilling MD, Jacelyn Pi   . ARTHRITIS 05/30/2008   Qualifier: Diagnosis of  By: Marland Mcalpine    . DIABETES MELLITUS, TYPE II 07/16/2007   Qualifier: Diagnosis of  By: Marca Ancona RMA, Lucy    . Diastolic congestive heart failure ([place]) 09/2017  . DIVERTICULOSIS, COLON 05/30/2008   Qualifier: Diagnosis of  By: Marland Mcalpine    . Dyspnea   . Glaucoma   . HYPERLIPIDEMIA 07/16/2007   Qualifier: Diagnosis of  By: Reatha Armour, Lucy    . Hypertension   . Normocytic anemia     Patient Active Problem List   Diagnosis Date Noted  . Chest pain 11/20/2017  . Diastolic congestive heart failure ([place]) 09/30/2017  . Diabetes type 2, uncontrolled ([place])   . Headache   . Hypertensive urgency   . Hypokalemia   . Bradycardia   . Normocytic anemia   . Chronic diastolic CHF (congestive heart failure) ([place])   . Other emphysema ([place])   . Glaucoma   . Hypertension 02/20/2015  . Malignant hypertension 02/20/2015  . ANEMIA, IRON DEFICIENCY 05/07/2009  . DYSPNEA 05/07/2009  . CONSTIPATION 06/01/2008  . HEMORRHOIDS 05/30/2008  .  DIVERTICULOSIS, COLON 05/30/2008  . ARTHRITIS 05/30/2008  . COUGH 02/03/2008  . Diabetes mellitus type 2 with complications ([place]) 47/42/5956  . HYPERLIPIDEMIA 07/16/2007  . Essential hypertension 07/16/2007  . Coronary atherosclerosis 07/16/2007  . ALLERGIC RHINITIS 07/16/2007  . OSTEOPOROSIS 07/16/2007    Past Surgical History:  Procedure Laterality Date  . ABDOMINAL HYSTERECTOMY      OB History   No obstetric history on file.      Home Medications    Prior to Admission medications   Medication Sig Start Date End Date Taking? Authorizing Provider  acetaminophen (TYLENOL) 500 MG tablet Take 500 mg by mouth every 6 (six) hours as needed.    [provider]  ALPRAZolam Duanne Moron) 0.5 MG tablet Take 0.5 mg by mouth daily as needed.  09/07/17   [provider]  amLODipine (NORVASC) 10 MG tablet Take 10 mg by mouth daily.    [provider]  aspirin 81 MG tablet Take 81 mg by mouth daily.    [provider]  cholecalciferol (VITAMIN D) 1000 UNITS tablet Take 2,000 Units by mouth daily.     [provider]  doxazosin (CARDURA) 2 MG tablet Take 1 tablet (2 mg total) by mouth daily. 07/13/19   Lelon Perla, MD  furosemide (LASIX) 20 MG tablet One tablet once daily as needed for swelling 11/15/18   Kirk Ruths  S, MD  hydrALAZINE (APRESOLINE) 100 MG tablet Take 1 tablet (100 mg total) by mouth 3 (three) times daily. 11/21/17   Hongalgi, Lenis Dickinson, MD  loratadine (CLARITIN) 5 MG/5ML syrup Take 5 mLs (5 mg total) by mouth daily. 04/18/19   Wieters, Hallie C, PA-C  pantoprazole (PROTONIX) 40 MG tablet Take 1 tablet (40 mg total) by mouth daily. 10/10/19   Chase Picket, MD  spironolactone (ALDACTONE) 50 MG tablet Take 25 mg by mouth daily. 02/04/19   [provider]  vitamin B-12 (CYANOCOBALAMIN) 500 MCG tablet Take 500 mcg by mouth daily.    [provider]    Family History Family History  Problem Relation Age of Onset   . Diabetes Mellitus II Other   . Pancreatic cancer Mother   . Heart disease Father   . CAD Brother     Social History Social History   Tobacco Use  . Smoking status: Former Research scientist (life sciences)  . Smokeless tobacco: Never Used  Substance Use Topics  . Alcohol use: No  . Drug use: No     Allergies   Wellbutrin [bupropion], Codeine, and Penicillins   Review of Systems Review of Systems  Constitutional: Negative.   HENT: Negative for mouth sores, sinus pressure, sinus pain, sneezing, sore throat and trouble swallowing.   Gastrointestinal: Negative.   Genitourinary: Negative.   Musculoskeletal: Negative.   Skin: Negative.   Neurological: Negative.      Physical Exam Triage Vital Signs ED Triage Vitals  Enc Vitals Group     BP 10/10/19 1700 (!) 152/89     Pulse Rate 10/10/19 1700 91     Resp 10/10/19 1700 15     Temp 10/10/19 1700 98.5 F (36.9 C)     Temp Source 10/10/19 1700 Oral     SpO2 10/10/19 1700 96 %     Weight --      Height --      Head Circumference --      Peak Flow --      Pain Score 10/10/19 1658 0     Pain Loc --      Pain Edu? --      Excl. in [place]? --    No data found.  Updated Vital Signs BP (!) 152/89 (BP Location: Left Arm)   Pulse 91   Temp 98.5 F (36.9 C) (Oral)   Resp 15   SpO2 96%   Visual Acuity Right Eye Distance:   Left Eye Distance:   Bilateral Distance:    Right Eye Near:   Left Eye Near:    Bilateral Near:     Physical Exam Vitals signs and nursing note reviewed.  Constitutional:      Appearance: She is not ill-appearing or toxic-appearing.  Cardiovascular:     Rate and Rhythm: Normal rate and regular rhythm.     Pulses: Normal pulses.     Heart sounds: Normal heart sounds.  Pulmonary:     Effort: Pulmonary effort is normal. No respiratory distress.     Breath sounds: Normal breath sounds. No rhonchi or rales.  Abdominal:     General: Abdomen is flat. Bowel sounds are normal.     Palpations: Abdomen is soft.   Musculoskeletal: Normal range of motion.  Skin:    Capillary Refill: Capillary refill takes less than 2 seconds.  Neurological:     Mental Status: She is alert.      UC Treatments / Results  Labs (all labs ordered are listed,  but only abnormal results are displayed) Labs Reviewed - No data to display  EKG   Radiology No results found.  Procedures Procedures (including critical care time)  Medications Ordered in UC Medications - No data to display  Initial Impression / Assessment and Plan / UC Course  I have reviewed the triage vital signs and the nursing notes.  Pertinent labs & imaging results that were available during my care of the patient were reviewed by me and considered in my medical decision making (see chart for details).     1. Dysgeusia: Patient was encouraged to resume Protonix use Sour taste in the mouth is likely secondary to gastroesophageal reflux disease. Patient is agreeable to restarting Protonix. If patient's symptoms persist she is advised to return to urgent care for further evaluation Final Clinical Impressions(s) / UC Diagnoses   Final diagnoses:  Dysgeusia   Discharge Instructions   None    ED Prescriptions    Medication Sig Dispense Auth. Provider   pantoprazole (PROTONIX) 40 MG tablet Take 1 tablet (40 mg total) by mouth daily.  Arjay Jaskiewicz, Myrene Galas, MD     PDMP not reviewed this encounter.   Chase Picket, MD 10/10/19 [id number]

## 2019-10-10 NOTE — ED Triage Notes (Signed)
Pt states she is having a bitter taste when she is eating x 2 months on and off. Pt report her PCP told her this is related to the acid reflux. Pt PCP prescribed pantoprazole DR 40 mg, but pt stop taking the medication after she read the side effects.

## 2019-10-19 DIAGNOSIS — J449 Chronic obstructive pulmonary disease, unspecified: Secondary | ICD-10-CM | POA: Diagnosis not present

## 2019-10-19 DIAGNOSIS — E1142 Type 2 diabetes mellitus with diabetic polyneuropathy: Secondary | ICD-10-CM | POA: Diagnosis not present

## 2019-10-19 DIAGNOSIS — I10 Essential (primary) hypertension: Secondary | ICD-10-CM | POA: Diagnosis not present

## 2019-10-19 DIAGNOSIS — E1121 Type 2 diabetes mellitus with diabetic nephropathy: Secondary | ICD-10-CM | POA: Diagnosis not present

## 2019-10-19 DIAGNOSIS — E1165 Type 2 diabetes mellitus with hyperglycemia: Secondary | ICD-10-CM | POA: Diagnosis not present

## 2019-10-19 DIAGNOSIS — E785 Hyperlipidemia, unspecified: Secondary | ICD-10-CM | POA: Diagnosis not present

## 2019-10-19 DIAGNOSIS — I509 Heart failure, unspecified: Secondary | ICD-10-CM | POA: Diagnosis not present

## 2019-10-19 DIAGNOSIS — D649 Anemia, unspecified: Secondary | ICD-10-CM | POA: Diagnosis not present

## 2019-10-19 DIAGNOSIS — E119 Type 2 diabetes mellitus without complications: Secondary | ICD-10-CM | POA: Diagnosis not present

## 2019-10-20 DIAGNOSIS — I10 Essential (primary) hypertension: Secondary | ICD-10-CM | POA: Diagnosis not present

## 2019-10-20 DIAGNOSIS — E1121 Type 2 diabetes mellitus with diabetic nephropathy: Secondary | ICD-10-CM | POA: Diagnosis not present

## 2019-10-20 DIAGNOSIS — E1142 Type 2 diabetes mellitus with diabetic polyneuropathy: Secondary | ICD-10-CM | POA: Diagnosis not present

## 2019-10-20 DIAGNOSIS — J449 Chronic obstructive pulmonary disease, unspecified: Secondary | ICD-10-CM | POA: Diagnosis not present

## 2019-10-20 DIAGNOSIS — K219 Gastro-esophageal reflux disease without esophagitis: Secondary | ICD-10-CM | POA: Diagnosis not present

## 2019-10-20 DIAGNOSIS — E785 Hyperlipidemia, unspecified: Secondary | ICD-10-CM | POA: Diagnosis not present

## 2019-10-20 DIAGNOSIS — I509 Heart failure, unspecified: Secondary | ICD-10-CM | POA: Diagnosis not present

## 2019-10-24 DIAGNOSIS — I739 Peripheral vascular disease, unspecified: Secondary | ICD-10-CM | POA: Diagnosis not present

## 2019-10-24 DIAGNOSIS — M79671 Pain in right foot: Secondary | ICD-10-CM | POA: Diagnosis not present

## 2019-10-24 DIAGNOSIS — M79672 Pain in left foot: Secondary | ICD-10-CM | POA: Diagnosis not present

## 2019-10-24 DIAGNOSIS — L603 Nail dystrophy: Secondary | ICD-10-CM | POA: Diagnosis not present

## 2019-10-24 DIAGNOSIS — L97512 Non-pressure chronic ulcer of other part of right foot with fat layer exposed: Secondary | ICD-10-CM | POA: Diagnosis not present

## 2019-11-22 DIAGNOSIS — M7751 Other enthesopathy of right foot: Secondary | ICD-10-CM | POA: Diagnosis not present

## 2019-11-22 DIAGNOSIS — M2042 Other hammer toe(s) (acquired), left foot: Secondary | ICD-10-CM | POA: Diagnosis not present

## 2019-11-22 DIAGNOSIS — M2041 Other hammer toe(s) (acquired), right foot: Secondary | ICD-10-CM | POA: Diagnosis not present

## 2019-11-22 DIAGNOSIS — M7752 Other enthesopathy of left foot: Secondary | ICD-10-CM | POA: Diagnosis not present

## 2019-11-29 DIAGNOSIS — M542 Cervicalgia: Secondary | ICD-10-CM | POA: Diagnosis not present

## 2019-11-29 DIAGNOSIS — I10 Essential (primary) hypertension: Secondary | ICD-10-CM | POA: Diagnosis not present

## 2019-11-29 DIAGNOSIS — I509 Heart failure, unspecified: Secondary | ICD-10-CM | POA: Diagnosis not present

## 2019-11-29 DIAGNOSIS — E119 Type 2 diabetes mellitus without complications: Secondary | ICD-10-CM | POA: Diagnosis not present

## 2019-12-12 DIAGNOSIS — E1142 Type 2 diabetes mellitus with diabetic polyneuropathy: Secondary | ICD-10-CM | POA: Diagnosis not present

## 2019-12-12 DIAGNOSIS — E1121 Type 2 diabetes mellitus with diabetic nephropathy: Secondary | ICD-10-CM | POA: Diagnosis not present

## 2019-12-12 DIAGNOSIS — I10 Essential (primary) hypertension: Secondary | ICD-10-CM | POA: Diagnosis not present

## 2019-12-12 DIAGNOSIS — J449 Chronic obstructive pulmonary disease, unspecified: Secondary | ICD-10-CM | POA: Diagnosis not present

## 2019-12-12 DIAGNOSIS — E119 Type 2 diabetes mellitus without complications: Secondary | ICD-10-CM | POA: Diagnosis not present

## 2019-12-12 DIAGNOSIS — E785 Hyperlipidemia, unspecified: Secondary | ICD-10-CM | POA: Diagnosis not present

## 2019-12-12 DIAGNOSIS — I509 Heart failure, unspecified: Secondary | ICD-10-CM | POA: Diagnosis not present

## 2019-12-12 DIAGNOSIS — E1165 Type 2 diabetes mellitus with hyperglycemia: Secondary | ICD-10-CM | POA: Diagnosis not present

## 2019-12-12 DIAGNOSIS — D649 Anemia, unspecified: Secondary | ICD-10-CM | POA: Diagnosis not present

## 2020-02-02 NOTE — Progress Notes (Signed)
HPI: FU bradycardia. Nuclear study January 2019 showed ejection fraction 46% and no ischemia or infarction. Ejection fraction felt better than calculated number. Echocardiogram inJanuary 2020 showed normal LV function, moderate left ventricular hypertrophy, mild diastolic dysfunction,mild mitral regurgitation. Monitor August 2020 showed sinus bradycardia with first-degree AV block, normal sinus rhythm, sinus tachycardia, intermittent Mobitz 1 second-degree AV block, multiple pauses with longest being 3.4 seconds, occasional PAC and PVC. Clonidine patch weaned to off. Since last seenshe occasionally feels dyspneic in the morning by her report but denies dyspnea on exertion, orthopnea, PND, pedal edema, chest pain or syncope.  Current Outpatient Medications  Medication Sig Dispense Refill  . acetaminophen (TYLENOL) 500 MG tablet Take 500 mg by mouth every 6 (six) hours as needed.    . ALPRAZolam (XANAX) 0.5 MG tablet Take 0.5 mg by mouth daily as needed.   0  . amLODipine (NORVASC) 10 MG tablet Take 10 mg by mouth daily.    Marland Kitchen aspirin 81 MG tablet Take 81 mg by mouth daily.    . cholecalciferol (VITAMIN D) 1000 UNITS tablet Take 2,000 Units by mouth daily.     Marland Kitchen doxazosin (CARDURA) 2 MG tablet Take 1 tablet (2 mg total) by mouth daily. 90 tablet 3  . furosemide (LASIX) 20 MG tablet One tablet once daily as needed for swelling (Patient taking differently: One tablet M,TH,SAT AND SUN 2 TABLETS ALL OTHER DAYS) 30 tablet 4  . hydrALAZINE (APRESOLINE) 100 MG tablet Take 1 tablet (100 mg total) by mouth 3 (three) times daily.    Marland Kitchen loratadine (CLARITIN) 5 MG/5ML syrup Take 5 mLs (5 mg total) by mouth daily. 60 mL 0  . spironolactone (ALDACTONE) 50 MG tablet Take 25 mg by mouth daily.    . vitamin B-12 (CYANOCOBALAMIN) 500 MCG tablet Take 500 mcg by mouth daily.     No current facility-administered medications for this visit.     Past Medical History:  Diagnosis Date  . ANEMIA, IRON  DEFICIENCY 05/07/2009   Qualifier: Diagnosis of  By: Loanne Drilling MD, Jacelyn Pi   . ARTHRITIS 05/30/2008   Qualifier: Diagnosis of  By: Marland Mcalpine    . DIABETES MELLITUS, TYPE II 07/16/2007   Qualifier: Diagnosis of  By: Marca Ancona RMA, Lucy    . Diastolic congestive heart failure ([place]) 09/2017  . DIVERTICULOSIS, COLON 05/30/2008   Qualifier: Diagnosis of  By: Marland Mcalpine    . Dyspnea   . Glaucoma   . HYPERLIPIDEMIA 07/16/2007   Qualifier: Diagnosis of  By: Reatha Armour, Lucy    . Hypertension   . Normocytic anemia     Past Surgical History:  Procedure Laterality Date  . ABDOMINAL HYSTERECTOMY      Social History   Socioeconomic History  . Marital status: Widowed    Spouse name: Not on file  . Number of children: Not on file  . Years of education: Not on file  . Highest education level: Not on file  Occupational History  . Not on file  Tobacco Use  . Smoking status: Former Research scientist (life sciences)  . Smokeless tobacco: Never Used  Substance and Sexual Activity  . Alcohol use: No  . Drug use: No  . Sexual activity: Not on file  Other Topics Concern  . Not on file  Social History Narrative   Widowed.  No children.  Has a brother.     Social Determinants of Health   Financial Resource Strain:   . Difficulty of Paying Living  Expenses: Not on file  Food Insecurity:   . Worried About Charity fundraiser in the Last Year: Not on file  . Ran Out of Food in the Last Year: Not on file  Transportation Needs:   . Lack of Transportation (Medical): Not on file  . Lack of Transportation (Non-Medical): Not on file  Physical Activity:   . Days of Exercise per Week: Not on file  . Minutes of Exercise per Session: Not on file  Stress:   . Feeling of Stress : Not on file  Social Connections:   . Frequency of Communication with Friends and Family: Not on file  . Frequency of Social Gatherings with Friends and Family: Not on file  . Attends Religious Services: Not on file  . Active Member of Clubs  or Organizations: Not on file  . Attends Archivist Meetings: Not on file  . Marital Status: Not on file  Intimate Partner Violence:   . Fear of Current or Ex-Partner: Not on file  . Emotionally Abused: Not on file  . Physically Abused: Not on file  . Sexually Abused: Not on file    Family History  Problem Relation Age of Onset  . Diabetes Mellitus II Other   . Pancreatic cancer Mother   . Heart disease Father   . CAD Brother     ROS: no fevers or chills, productive cough, hemoptysis, dysphasia, odynophagia, melena, hematochezia, dysuria, hematuria, rash, seizure activity, orthopnea, PND, pedal edema, claudication. Remaining systems are negative.  Physical Exam: Well-developed well-nourished in no acute distress.  Skin is warm and dry.  HEENT is normal.  Neck is supple.  Chest is clear to auscultation with normal expansion.  Cardiovascular exam is regular rate and rhythm.  2/6 systolic murmur left sternal border Abdominal exam nontender or distended. No masses palpated. Extremities show no edema. neuro grossly intact  ECG-sinus rhythm with first-degree AV block, left axis deviation.  Personally reviewed  A/P  1 history of bradycardia-heart rate improved with discontinuation of clonidine.  No history of dizziness or syncope.  Follow for recurrent symptoms or bradycardia.  2 hypertension-patient's blood pressure is elevated.  However she follows this at home and it is typically controlled.  Continue present medications and follow.  3 chronic diastolic congestive heart failure-continue present dose of diuretic.  She appears to be doing reasonably well from a volume standpoint. Check bmet.   4 mitral regurgitation-mild on most recent echocardiogram.  5 chronic stage III kidney disease-Per nephrology.  Kirk Ruths, MD

## 2020-02-12 ENCOUNTER — Ambulatory Visit: Payer: Medicare Other | Attending: Internal Medicine

## 2020-02-12 DIAGNOSIS — Z23 Encounter for immunization: Secondary | ICD-10-CM | POA: Insufficient documentation

## 2020-02-12 NOTE — Progress Notes (Signed)
   Covid-19 Vaccination Clinic  Name:  Krista Rosales    MRN: [id number] DOB: [birth year]  02/12/2020  [name] was observed post Covid-19 immunization for 15 minutes without incidence. She was provided with Vaccine Information Sheet and instruction to access the V-Safe system.   [name] was instructed to call 911 with any severe reactions post vaccine: Marland Kitchen Difficulty breathing  . Swelling of your face and throat  . A fast heartbeat  . A bad rash all over your body  . Dizziness and weakness    Immunizations Administered    Name Date Dose VIS Date Route   Pfizer COVID-19 Vaccine 02/12/2020  1:09 PM 0.3 mL 12/02/2019 Intramuscular   Manufacturer: [place]   Lot: [id number]   [place]: [id number]

## 2020-02-13 ENCOUNTER — Other Ambulatory Visit: Payer: Self-pay

## 2020-02-13 ENCOUNTER — Encounter: Payer: Self-pay | Admitting: Cardiology

## 2020-02-13 ENCOUNTER — Ambulatory Visit (INDEPENDENT_AMBULATORY_CARE_PROVIDER_SITE_OTHER): Payer: Medicare Other | Admitting: Cardiology

## 2020-02-13 VITALS — BP 150/60 | HR 70 | Ht 68.0 in | Wt 159.8 lb

## 2020-02-13 DIAGNOSIS — R001 Bradycardia, unspecified: Secondary | ICD-10-CM

## 2020-02-13 DIAGNOSIS — I059 Rheumatic mitral valve disease, unspecified: Secondary | ICD-10-CM | POA: Diagnosis not present

## 2020-02-13 DIAGNOSIS — I10 Essential (primary) hypertension: Secondary | ICD-10-CM

## 2020-02-13 DIAGNOSIS — I5032 Chronic diastolic (congestive) heart failure: Secondary | ICD-10-CM

## 2020-02-13 LAB — BASIC METABOLIC PANEL
BUN/Creatinine Ratio: 21 (ref 12–28)
BUN: 44 mg/dL — ABNORMAL HIGH (ref 8–27)
CO2: 18 mmol/L — ABNORMAL LOW (ref 20–29)
Calcium: 9.2 mg/dL (ref 8.7–10.3)
Chloride: 105 mmol/L (ref 96–106)
Creatinine, Ser: 2.11 mg/dL — ABNORMAL HIGH (ref 0.57–1.00)
GFR calc Af Amer: 24 mL/min/{1.73_m2} — ABNORMAL LOW (ref >59)
GFR calc non Af Amer: 21 mL/min/{1.73_m2} — ABNORMAL LOW (ref >59)
Glucose: 231 mg/dL — ABNORMAL HIGH (ref 65–99)
Potassium: 5.1 mmol/L (ref 3.5–5.2)
Sodium: 139 mmol/L (ref 134–144)

## 2020-02-13 NOTE — Patient Instructions (Signed)
Medication Instructions:  NO CHANGE *If you need a refill on your cardiac medications before your next appointment, please call your pharmacy*  Lab Work: Your physician recommends that you HAVE LAB WORK TODAY If you have labs (blood work) drawn today and your tests are completely normal, you will receive your results only by: . MyChart Message (if you have MyChart) OR . A paper copy in the mail If you have any lab test that is abnormal or we need to change your treatment, we will call you to review the results.  Follow-Up: At [facility], you and your health needs are our priority.  As part of our continuing mission to provide you with exceptional heart care, we have created designated Provider Care Teams.  These Care Teams include your primary Cardiologist (physician) and Advanced Practice Providers (APPs -  Physician Assistants and Nurse Practitioners) who all work together to provide you with the care you need, when you need it.  Your next appointment:   6 month(s)  The format for your next appointment:   Either In Person or Virtual  Provider:   You may see Brian Crenshaw, MD or one of the following Advanced Practice Providers on your designated Care Team:    Luke Kilroy, PA-C  Callie Goodrich, PA-C  Jesse Cleaver, FNP    

## 2020-02-14 ENCOUNTER — Telehealth: Payer: Self-pay | Admitting: *Deleted

## 2020-02-14 DIAGNOSIS — N289 Disorder of kidney and ureter, unspecified: Secondary | ICD-10-CM

## 2020-02-14 DIAGNOSIS — I5032 Chronic diastolic (congestive) heart failure: Secondary | ICD-10-CM

## 2020-02-14 MED ORDER — SPIRONOLACTONE 50 MG PO TABS: 25.0000 mg | ORAL_TABLET | Freq: Every day | ORAL | Status: DC

## 2020-02-14 NOTE — Telephone Encounter (Addendum)
Spoke with pt, Aware of dr Jacalyn Lefevre recommendations. Lab orders mailed to the pt    ----- Message from Lelon Perla, MD sent at 02/13/2020  4:42 PM EST ----- Decrease spironolactone to 25 mg daily; bmet 4 weeks Kirk Ruths

## 2020-03-07 ENCOUNTER — Ambulatory Visit: Payer: Medicare Other | Attending: Internal Medicine

## 2020-03-07 DIAGNOSIS — Z23 Encounter for immunization: Secondary | ICD-10-CM

## 2020-03-07 NOTE — Progress Notes (Signed)
   Covid-19 Vaccination Clinic  Name:  Krista Rosales    MRN: [id number] DOB: 17-May-1934  03/07/2020  [name] was observed post Covid-19 immunization for 15 minutes without incident. She was provided with Vaccine Information Sheet and instruction to access the V-Safe system.   [name] was instructed to call 911 with any severe reactions post vaccine: Marland Kitchen Difficulty breathing  . Swelling of face and throat  . A fast heartbeat  . A bad rash all over body  . Dizziness and weakness   Immunizations Administered    Name Date Dose VIS Date Route   Pfizer COVID-19 Vaccine 03/07/2020  8:56 AM 0.3 mL 12/02/2019 Intramuscular   Manufacturer: [place]   Lot: [id number]   [place]: [id number]

## 2020-03-14 ENCOUNTER — Telehealth: Payer: Self-pay | Admitting: *Deleted

## 2020-03-14 ENCOUNTER — Other Ambulatory Visit: Payer: Self-pay | Admitting: *Deleted

## 2020-03-14 DIAGNOSIS — E875 Hyperkalemia: Secondary | ICD-10-CM

## 2020-03-14 LAB — BASIC METABOLIC PANEL
BUN/Creatinine Ratio: 17 (ref 12–28)
BUN: 39 mg/dL — ABNORMAL HIGH (ref 8–27)
CO2: 19 mmol/L — ABNORMAL LOW (ref 20–29)
Calcium: 9.2 mg/dL (ref 8.7–10.3)
Chloride: 104 mmol/L (ref 96–106)
Creatinine, Ser: 2.27 mg/dL — ABNORMAL HIGH (ref 0.57–1.00)
GFR calc Af Amer: 22 mL/min/{1.73_m2} — ABNORMAL LOW (ref >59)
GFR calc non Af Amer: 19 mL/min/{1.73_m2} — ABNORMAL LOW (ref >59)
Glucose: 183 mg/dL — ABNORMAL HIGH (ref 65–99)
Potassium: 5.5 mmol/L — ABNORMAL HIGH (ref 3.5–5.2)
Sodium: 138 mmol/L (ref 134–144)

## 2020-03-14 NOTE — Telephone Encounter (Addendum)
Spoke with pt, Aware of dr Jacalyn Lefevre recommendations. Lab orders mailed to the pt   ----- Message from Lelon Perla, MD sent at 03/14/2020  7:28 AM EDT ----- DC spironolactone; bmet one week Kirk Ruths

## 2020-03-28 LAB — BASIC METABOLIC PANEL
BUN/Creatinine Ratio: 21 (ref 12–28)
BUN: 48 mg/dL — ABNORMAL HIGH (ref 8–27)
CO2: 22 mmol/L (ref 20–29)
Calcium: 9.7 mg/dL (ref 8.7–10.3)
Chloride: 103 mmol/L (ref 96–106)
Creatinine, Ser: 2.34 mg/dL — ABNORMAL HIGH (ref 0.57–1.00)
GFR calc Af Amer: 22 mL/min/{1.73_m2} — ABNORMAL LOW (ref >59)
GFR calc non Af Amer: 19 mL/min/{1.73_m2} — ABNORMAL LOW (ref >59)
Glucose: 237 mg/dL — ABNORMAL HIGH (ref 65–99)
Potassium: 4.9 mmol/L (ref 3.5–5.2)
Sodium: 140 mmol/L (ref 134–144)

## 2020-03-30 ENCOUNTER — Ambulatory Visit: Payer: Self-pay

## 2020-03-30 ENCOUNTER — Encounter: Payer: Self-pay | Admitting: Family Medicine

## 2020-03-30 ENCOUNTER — Ambulatory Visit (INDEPENDENT_AMBULATORY_CARE_PROVIDER_SITE_OTHER): Payer: Medicare Other

## 2020-03-30 ENCOUNTER — Other Ambulatory Visit: Payer: Self-pay

## 2020-03-30 ENCOUNTER — Ambulatory Visit (INDEPENDENT_AMBULATORY_CARE_PROVIDER_SITE_OTHER): Payer: Medicare Other | Admitting: Family Medicine

## 2020-03-30 VITALS — BP 132/82 | HR 60 | Ht 68.0 in | Wt 159.0 lb

## 2020-03-30 DIAGNOSIS — M25562 Pain in left knee: Secondary | ICD-10-CM

## 2020-03-30 MED ORDER — DICLOFENAC SODIUM 1 % EX GEL: 4.0000 g | Freq: Four times a day (QID) | CUTANEOUS | 11 refills | Status: DC

## 2020-03-30 NOTE — Progress Notes (Signed)
Subjective:   I, Krista Rosales, am serving as a scribe for Dr. Hulan Saas. This visit occurred during the SARS-CoV-2 public health emergency.  Safety protocols were in place, including screening questions prior to the visit, additional usage of staff PPE, and extensive cleaning of exam room while observing appropriate contact time as indicated for disinfecting solutions.   I'm seeing this patient as a consultation for:  Krista Huddle, MD . Note will be routed back to referring provider/PCP.  CC: left knee pain  HPI: Patient here for left knee pain. Patient states that she has had pain for years but has had an increase in pain over past couple of days. Pain located over tibial tuberosity. Notes swelling waxes and wains in this area. Was not on her feet more recently.  Her PCP injected her knee about 3 weeks ago which helped only a little.  Pain worsened about a week ago.  Pain is worse with activity better with rest.  She is tried some Tylenol which helps some.  Past medical history, Surgical history, Family history, Social history, Allergies, and medications have been entered into the medical record, reviewed.   Review of Systems: No new headache, visual changes, nausea, vomiting, diarrhea, constipation, dizziness, abdominal pain, skin rash, fevers, chills, night sweats, weight loss, swollen lymph nodes, body aches, joint swelling, muscle aches, chest pain, shortness of breath, mood changes, visual or auditory hallucinations.   Objective:    Vitals:   03/30/20 1416  BP: 132/82  Pulse: 60  SpO2: 97%   General: Well Developed, well nourished, and in no acute distress.  Neuro/Psych: Alert and oriented x3, extra-ocular muscles intact, able to move all 4 extremities, sensation grossly intact. Skin: Warm and dry, no rashes noted.  Respiratory: Not using accessory muscles, speaking in full sentences, trachea midline.  Cardiovascular: Pulses palpable, no extremity edema. Abdomen: Does not  appear distended. MSK: Left knee: Mature scar overlying anterior lateral knee.  Prominent tibial tuberosity anterior knee. Otherwise normal-appearing Motion 0-120 degrees with crepitation. Tender palpation mildly at tibial tuberosity. Stable ligamentous exam. Intact strength.  Lab and Radiology Results  X-ray images left knee obtained today personally and independently reviewed Moderate DJD no acute fractures Await formal radiology review  Diagnostic Limited MSK Ultrasound of: Left knee Quadriceps tendon intact. Moderate effusion present underlying quad tendon. Patellar tendon intact. Prominent tibial tuberosity without tendon disruption.  Not particularly tender palpation with ultrasound probe. Medial and lateral joint lines are narrowed with degenerative appearing meniscus. Bony structures otherwise normal-appearing Impression: Moderate joint effusion DJD.   Impression and Recommendations:    Assessment and Plan: 86 y.o. female with left knee pain predominantly due to DJD and knee effusion.  Discussed options.  Patient declines injection today noting that she had one 3 weeks ago at PCP office. Plan for Voltaren gel and home health physical therapy referral.  Check back in about a month.  Return sooner if needed.  If steroid injections not helpful would consider hyaluronic acid injections in the future.Marland Kitchen  PDMP not reviewed this encounter. Orders Placed This Encounter  Procedures  . [place] LIMITED JOINT SPACE STRUCTURES LOW LEFT(NO LINKED CHARGES)    Order Specific Question:   Reason for Exam (SYMPTOM  OR DIAGNOSIS REQUIRED)    Answer:   eval left knee pain    Order Specific Question:   Preferred imaging location?    Answer:   [facility]  . DG Knee AP/LAT W/[place] Left    Standing Status:  Future    Number of Occurrences:   1    Standing Expiration Date:   05/30/2021    Order Specific Question:   Reason for Exam (SYMPTOM  OR DIAGNOSIS REQUIRED)     Answer:   eval knee pain    Order Specific Question:   Preferred imaging location?    Answer:   [facility]    Order Specific Question:   Radiology Contrast Protocol - do NOT remove file path    Answer:   [email or web address]  . Ambulatory referral to Home Health    Referral Priority:   Routine    Referral Type:   Home Health Care    Referral Reason:   Specialty Services Required    Requested Specialty:   [facility]    Number of Visits Requested:   1   Meds ordered this encounter  Medications  . diclofenac Sodium (VOLTAREN) 1 % GEL    Sig: Apply 4 g topically 4 (four) times daily. To affected joint.    Dispense:  100 g    Refill:  11    KNee OA. Failed ibuprofen and aleve and tylenol    Discussed warning signs or symptoms. Please see discharge instructions. Patient expresses understanding.   The above documentation has been reviewed and is accurate and complete [name]

## 2020-03-30 NOTE — Patient Instructions (Signed)
Thank you for coming in today.  Use the over the counter voltaren gel up to 4x daily for pain.  I am starting home health physical therapy.  They should call you soon.  Let me know if nobody calls you in 1 week.   Get xray today on your way out.   Recheck with me in 1 month.  Return sooner if not doing well.

## 2020-04-02 NOTE — Progress Notes (Signed)
X-ray shows no fractures.

## 2020-04-11 ENCOUNTER — Telehealth: Payer: Self-pay | Admitting: Family Medicine

## 2020-04-11 NOTE — Telephone Encounter (Signed)
Krista Rosales from [facility] called requesting verbal orders for Home PT: 1 time a week for 1 week 2 times a week for 5 weeks 1 time a week for 1 week To help with strengthening, gait, balance training, home exercise programs and home safety.  She also wanted to make Dr Georgina Snell aware that when she was out to see the patient last week, [name]'s BP was 200/90. She does have a BP cuff at home and was instructed to check it frequently and to contact us if it continued to stay high.  Krista Rosales can be reached at [phone].

## 2020-04-12 NOTE — Telephone Encounter (Signed)
Called pt to f/u on the BP piece and she has scheduled a f/u w/ her PCP Dr. Inda Merlin.

## 2020-04-12 NOTE — Telephone Encounter (Signed)
Called and LM for Krista Rosales w/ [facility] w/ OK for verbal orders of home PT as recommended.  Also advised Krista Rosales of Dr. Clovis Riley recommendation that pt f/u w/ her PCP regarding her BP.

## 2020-04-12 NOTE — Telephone Encounter (Signed)
Okay for verbal order.  That blood pressure is quite high.  Recommend follow-up with primary care provider to discuss blood pressure management options.

## 2020-04-17 ENCOUNTER — Other Ambulatory Visit: Payer: Self-pay

## 2020-04-17 ENCOUNTER — Emergency Department (HOSPITAL_COMMUNITY): Payer: Medicare Other

## 2020-04-17 ENCOUNTER — Emergency Department (HOSPITAL_COMMUNITY)
Admission: EM | Admit: 2020-04-17 | Discharge: 2020-04-17 | Disposition: A | Discharge status: Home / Self Care | Payer: Medicare Other | Attending: Emergency Medicine | Admitting: Emergency Medicine

## 2020-04-17 ENCOUNTER — Encounter (HOSPITAL_COMMUNITY): Payer: Self-pay

## 2020-04-17 DIAGNOSIS — Z79899 Other long term (current) drug therapy: Secondary | ICD-10-CM | POA: Insufficient documentation

## 2020-04-17 DIAGNOSIS — Z87891 Personal history of nicotine dependence: Secondary | ICD-10-CM | POA: Diagnosis not present

## 2020-04-17 DIAGNOSIS — I10 Essential (primary) hypertension: Secondary | ICD-10-CM

## 2020-04-17 DIAGNOSIS — I5032 Chronic diastolic (congestive) heart failure: Secondary | ICD-10-CM | POA: Diagnosis not present

## 2020-04-17 DIAGNOSIS — I110 Hypertensive heart disease with heart failure: Secondary | ICD-10-CM | POA: Insufficient documentation

## 2020-04-17 DIAGNOSIS — R739 Hyperglycemia, unspecified: Secondary | ICD-10-CM

## 2020-04-17 DIAGNOSIS — E1165 Type 2 diabetes mellitus with hyperglycemia: Secondary | ICD-10-CM | POA: Diagnosis not present

## 2020-04-17 DIAGNOSIS — Z7982 Long term (current) use of aspirin: Secondary | ICD-10-CM | POA: Insufficient documentation

## 2020-04-17 LAB — URINALYSIS, ROUTINE W REFLEX MICROSCOPIC
Bilirubin Urine: NEGATIVE
Glucose, UA: NEGATIVE mg/dL
Hgb urine dipstick: NEGATIVE
Ketones, ur: NEGATIVE mg/dL
Leukocytes,Ua: NEGATIVE
Nitrite: NEGATIVE
Protein, ur: NEGATIVE mg/dL
Specific Gravity, Urine: 1.003 — ABNORMAL LOW (ref 1.005–1.030)
pH: 6 (ref 5.0–8.0)

## 2020-04-17 LAB — COMPREHENSIVE METABOLIC PANEL
ALT: 15 U/L (ref 0–44)
AST: 22 U/L (ref 15–41)
Albumin: 3.9 g/dL (ref 3.5–5.0)
Alkaline Phosphatase: 39 U/L (ref 38–126)
Anion gap: 7 (ref 5–15)
BUN: 27 mg/dL — ABNORMAL HIGH (ref 8–23)
CO2: 27 mmol/L (ref 22–32)
Calcium: 8.7 mg/dL — ABNORMAL LOW (ref 8.9–10.3)
Chloride: 95 mmol/L — ABNORMAL LOW (ref 98–111)
Creatinine, Ser: 1.77 mg/dL — ABNORMAL HIGH (ref 0.44–1.00)
GFR calc Af Amer: 30 mL/min — ABNORMAL LOW (ref >60)
GFR calc non Af Amer: 26 mL/min — ABNORMAL LOW (ref >60)
Glucose, Bld: 234 mg/dL — ABNORMAL HIGH (ref 70–99)
Potassium: 4.2 mmol/L (ref 3.5–5.1)
Sodium: 129 mmol/L — ABNORMAL LOW (ref 135–145)
Total Bilirubin: 0.6 mg/dL (ref 0.3–1.2)
Total Protein: 7.4 g/dL (ref 6.5–8.1)

## 2020-04-17 LAB — CBC
HCT: 34.5 % — ABNORMAL LOW (ref 36.0–46.0)
Hemoglobin: 10.8 g/dL — ABNORMAL LOW (ref 12.0–15.0)
MCH: 28.9 pg (ref 26.0–34.0)
MCHC: 31.3 g/dL (ref 30.0–36.0)
MCV: 92.2 fL (ref 80.0–100.0)
Platelets: 270 10*3/uL (ref 150–400)
RBC: 3.74 MIL/uL — ABNORMAL LOW (ref 3.87–5.11)
RDW: 12.9 % (ref 11.5–15.5)
WBC: 10.2 10*3/uL (ref 4.0–10.5)
nRBC: 0 % (ref 0.0–0.2)

## 2020-04-17 LAB — CBG MONITORING, ED: Glucose-Capillary: 155 mg/dL — ABNORMAL HIGH (ref 70–99)

## 2020-04-17 MED ORDER — SODIUM CHLORIDE 0.9% FLUSH: 3.0000 mL | Freq: Once | INTRAVENOUS | Status: DC

## 2020-04-17 MED ORDER — HYDRALAZINE HCL 25 MG PO TABS
100.0000 mg | ORAL_TABLET | Freq: Once | ORAL | Status: AC
  Administered 2020-04-17: 100 mg via ORAL
  Filled 2020-04-17: qty 4

## 2020-04-17 MED ORDER — HYDRALAZINE HCL 100 MG PO TABS: 100.0000 mg | ORAL_TABLET | Freq: Three times a day (TID) | ORAL | 0 refills | Status: DC

## 2020-04-17 NOTE — ED Triage Notes (Signed)
Patient complains of BP running high and blood sugar running high today. Patient states that she takes her BP meds as prescribed and hasnt taken diabetic meds for some time because doctor stopped them. patient alert and in no distress

## 2020-04-17 NOTE — Discharge Instructions (Signed)
Increase your Hydralazine to 100 mg three times a day.  Continue to check your blood sugar daily.  Eat a low salt and low carbohydrate diet.

## 2020-04-17 NOTE — ED Provider Notes (Signed)
[facility] EMERGENCY DEPARTMENT Provider Note   CSN: [id number] Arrival date & time: 04/17/20  1419     History No chief complaint on file.   [name] is a 86 y.o. female.  Pt presents to the ED today with elevated blood pressure and elevated blood sugar.  Pt has been having bp issues for a long time.  She saw her PCP, Dr. Inda Merlin, a few weeks ago.  He increased her hydralazine from 50 mg tid to 100 mg in the morning and at night and 50 mg mid day.  The pt said that has not done much.  The pt has a hx of DM, but it's been diet controlled.  She checks her blood sugar daily and it's been good until today when it was elevated.  She ate a raisin muffin today and that's all she's had to eat.  Pt denies any sob.  No cp.  No headache.        Past Medical History:  Diagnosis Date  . ANEMIA, IRON DEFICIENCY 05/07/2009   Qualifier: Diagnosis of  By: Loanne Drilling MD, Jacelyn Pi   . ARTHRITIS 05/30/2008   Qualifier: Diagnosis of  By: Marland Mcalpine    . DIABETES MELLITUS, TYPE II 07/16/2007   Qualifier: Diagnosis of  By: Marca Ancona RMA, Lucy    . Diastolic congestive heart failure ([place]) 09/2017  . DIVERTICULOSIS, COLON 05/30/2008   Qualifier: Diagnosis of  By: Marland Mcalpine    . Dyspnea   . Glaucoma   . HYPERLIPIDEMIA 07/16/2007   Qualifier: Diagnosis of  By: Reatha Armour, Lucy    . Hypertension   . Normocytic anemia     Patient Active Problem List   Diagnosis Date Noted  . Chest pain 11/20/2017  . Diastolic congestive heart failure ([place]) 09/30/2017  . Diabetes type 2, uncontrolled ([place])   . Headache   . Hypertensive urgency   . Hypokalemia   . Bradycardia   . Normocytic anemia   . Chronic diastolic CHF (congestive heart failure) ([place])   . Other emphysema ([place])   . Glaucoma   . Hypertension 02/20/2015  . Malignant hypertension 02/20/2015  . ANEMIA, IRON DEFICIENCY 05/07/2009  . DYSPNEA 05/07/2009  . CONSTIPATION 06/01/2008  . HEMORRHOIDS 05/30/2008  .  DIVERTICULOSIS, COLON 05/30/2008  . ARTHRITIS 05/30/2008  . COUGH 02/03/2008  . Diabetes mellitus type 2 with complications ([place]) 65/46/5035  . HYPERLIPIDEMIA 07/16/2007  . Essential hypertension 07/16/2007  . Coronary atherosclerosis 07/16/2007  . ALLERGIC RHINITIS 07/16/2007  . OSTEOPOROSIS 07/16/2007    Past Surgical History:  Procedure Laterality Date  . ABDOMINAL HYSTERECTOMY       OB History   No obstetric history on file.     Family History  Problem Relation Age of Onset  . Diabetes Mellitus II Other   . Pancreatic cancer Mother   . Heart disease Father   . CAD Brother     Social History   Tobacco Use  . Smoking status: Former Research scientist (life sciences)  . Smokeless tobacco: Never Used  Substance Use Topics  . Alcohol use: No  . Drug use: No    Home Medications Prior to Admission medications   Medication Sig Start Date End Date Taking? Authorizing Provider  acetaminophen (TYLENOL) 500 MG tablet Take 500 mg by mouth every 6 (six) hours as needed.   Yes [provider]  ALPRAZolam Duanne Moron) 0.5 MG tablet Take 0.25 mg by mouth daily as needed for anxiety or sleep.  09/07/17  Yes [provider]  amLODipine (NORVASC) 10 MG tablet Take 10 mg by mouth daily.   Yes [provider]  aspirin 81 MG tablet Take 81 mg by mouth daily.   Yes [provider]  cholecalciferol (VITAMIN D) 1000 UNITS tablet Take 2,000 Units by mouth daily.    Yes [provider]  furosemide (LASIX) 20 MG tablet One tablet once daily as needed for swelling Patient taking differently: Take 20 mg by mouth See admin instructions. Takes 1 tablet Tuesday,Thursday, Saturday and Sunday and 2 tablets on Monday, Wednesday and Friday. 11/15/18  Yes Lelon Perla, MD  magnesium hydroxide (MILK OF MAGNESIA) 400 MG/5ML suspension Take 15 mLs by mouth daily as needed for mild constipation.   Yes [provider]  Propylene Glycol (SYSTANE BALANCE OP) Apply 1 drop to eye daily  as needed (for dry eyes).   Yes [provider]  vitamin B-12 (CYANOCOBALAMIN) 1000 MCG tablet Take 1,000 mcg by mouth daily.   Yes [provider]  diclofenac Sodium (VOLTAREN) 1 % GEL Apply 4 g topically 4 (four) times daily. To affected joint. Patient not taking: Reported on 04/17/2020 03/30/20   Gregor Hams, MD  doxazosin (CARDURA) 2 MG tablet Take 1 tablet (2 mg total) by mouth daily. Patient not taking: Reported on 04/17/2020 07/13/19   Lelon Perla, MD  hydrALAZINE (APRESOLINE) 100 MG tablet Take 1 tablet (100 mg total) by mouth 3 (three) times daily. 04/17/20   Isla Pence, MD  loratadine (CLARITIN) 5 MG/5ML syrup Take 5 mLs (5 mg total) by mouth daily. Patient not taking: Reported on 04/17/2020 04/18/19   Wieters, Madelynn Done C, PA-C    Allergies    Wellbutrin [bupropion], Codeine, and Penicillins  Review of Systems   Review of Systems  All other systems reviewed and are negative.   Physical Exam Updated Vital Signs BP (!) 181/70 (BP Location: Right Arm)   Pulse 94   Temp 98.4 F (36.9 C) (Oral)   Resp 20   Ht 5\' 9"  (1.753 m)   Wt 69.4 kg   SpO2 99%   BMI 22.59 kg/m   Physical Exam Vitals and nursing note reviewed.  Constitutional:      Appearance: Normal appearance.  HENT:     Head: Normocephalic and atraumatic.     Right Ear: External ear normal.     Left Ear: External ear normal.     Nose: Nose normal.     Mouth/Throat:     Mouth: Mucous membranes are moist.     Pharynx: Oropharynx is clear.  Eyes:     Extraocular Movements: Extraocular movements intact.     Conjunctiva/sclera: Conjunctivae normal.     Pupils: Pupils are equal, round, and reactive to light.  Cardiovascular:     Rate and Rhythm: Normal rate and regular rhythm.     Pulses: Normal pulses.     Heart sounds: Normal heart sounds.  Pulmonary:     Effort: Pulmonary effort is normal.     Breath sounds: Normal breath sounds.  Abdominal:     General: Abdomen is flat. Bowel  sounds are normal.     Palpations: Abdomen is soft.  Musculoskeletal:        General: Normal range of motion.     Cervical back: Normal range of motion and neck supple.  Skin:    General: Skin is warm.     Capillary Refill: Capillary refill takes less than 2 seconds.  Neurological:  General: No focal deficit present.     Mental Status: She is alert and oriented to person, place, and time.  Psychiatric:        Mood and Affect: Mood normal.        Behavior: Behavior normal.        Thought Content: Thought content normal.        Judgment: Judgment normal.     ED Results / Procedures / Treatments   Labs (all labs ordered are listed, but only abnormal results are displayed) Labs Reviewed  COMPREHENSIVE METABOLIC PANEL - Abnormal; Notable for the following components:      Result Value   Sodium 129 (*)    Chloride 95 (*)    Glucose, Bld 234 (*)    BUN 27 (*)    Creatinine, Ser 1.77 (*)    Calcium 8.7 (*)    GFR calc non Af Amer 26 (*)    GFR calc Af Amer 30 (*)    All other components within normal limits  CBC - Abnormal; Notable for the following components:   RBC 3.74 (*)    Hemoglobin 10.8 (*)    HCT 34.5 (*)    All other components within normal limits  URINALYSIS, ROUTINE W REFLEX MICROSCOPIC - Abnormal; Notable for the following components:   Color, Urine COLORLESS (*)    Specific Gravity, Urine 1.003 (*)    All other components within normal limits  CBG MONITORING, ED - Abnormal; Notable for the following components:   Glucose-Capillary 155 (*)    All other components within normal limits    EKG EKG Interpretation  Date/Time:  Tuesday April 17 2020 19:07:08 EDT Ventricular Rate:  73 PR Interval:    QRS Duration: 99 QT Interval:  469 QTC Calculation: 517 R Axis:   -68 Text Interpretation: Sinus rhythm Sinus pause Prolonged PR interval Left anterior fascicular block Anterior infarct, old Prolonged QT interval Confirmed by Isla Pence [phone]) on  04/17/2020 7:08:30 PM   Radiology DG Chest 2 View  Result Date: 04/17/2020 CLINICAL DATA:  86 year old female with history of high blood pressure. EXAM: CHEST - 2 VIEW COMPARISON:  Chest x-ray 04/18/2019. FINDINGS: Lung volumes are increased with emphysematous changes. No acute consolidative airspace disease. No pleural effusions. No evidence of pulmonary edema. No definite suspicious appearing pulmonary nodules or masses are noted. Cephalization of the pulmonary vasculature, without frank pulmonary edema. Heart size is mildly enlarged. Upper mediastinal contours are within normal limits. Aortic atherosclerosis. IMPRESSION: 1. Cardiomegaly with pulmonary venous congestion, but no frank pulmonary edema. 2. Emphysema. 3. Aortic atherosclerosis. Electronically Signed   By: Vinnie Langton M.D.   On: 04/17/2020 18:48    Procedures Procedures (including critical care time)  Medications Ordered in ED Medications  sodium chloride flush (NS) 0.9 % injection 3 mL (has no administration in time range)  hydrALAZINE (APRESOLINE) tablet 100 mg (has no administration in time range)    ED Course  I have reviewed the triage vital signs and the nursing notes.  Pertinent labs & imaging results that were available during my care of the patient were reviewed by me and considered in my medical decision making (see chart for details).    MDM Rules/Calculators/A&P                      Pt's bp continues to be elevated.  I told her to increase her hydralazine to 100 mg tid.  BS is down to 155.  I don't  want to start on a diabetic medication now.  She is told to continue monitoring her blood sugar and to eat a low carb and a low salt diet.  Pt's na is low, but when corrected for glucose it is 132.  That is slightly lower than her normal, but not in the worrisome zone.  Pt is told to f/u with her pcp.  Return if worse.  Final Clinical Impression(s) / ED Diagnoses Final diagnoses:  Essential hypertension   Hyperglycemia    Rx / DC Orders ED Discharge Orders         Ordered    hydrALAZINE (APRESOLINE) 100 MG tablet  3 times daily     04/17/20 1930           Isla Pence, MD 04/17/20 1935

## 2020-04-17 NOTE — ED Notes (Signed)
Patient verbalizes understanding of discharge instructions. Opportunity for questioning and answers were provided. Armband removed by staff, pt discharged from ED wheelchair.

## 2020-04-20 ENCOUNTER — Telehealth (HOSPITAL_COMMUNITY): Payer: Self-pay | Admitting: Emergency Medicine

## 2020-04-20 ENCOUNTER — Ambulatory Visit (HOSPITAL_COMMUNITY)
Admission: EM | Admit: 2020-04-20 | Discharge: 2020-04-20 | Disposition: A | Discharge status: Home / Self Care | Payer: Medicare Other | Source: Home / Self Care | Attending: Family Medicine | Admitting: Family Medicine

## 2020-04-20 ENCOUNTER — Emergency Department (HOSPITAL_COMMUNITY)
Admission: EM | Admit: 2020-04-20 | Discharge: 2020-04-20 | Disposition: A | Discharge status: Home / Self Care | Payer: Medicare Other | Attending: Emergency Medicine | Admitting: Emergency Medicine

## 2020-04-20 ENCOUNTER — Encounter (HOSPITAL_COMMUNITY): Payer: Self-pay

## 2020-04-20 ENCOUNTER — Other Ambulatory Visit: Payer: Self-pay

## 2020-04-20 ENCOUNTER — Encounter (HOSPITAL_COMMUNITY): Payer: Self-pay | Admitting: Emergency Medicine

## 2020-04-20 DIAGNOSIS — Z79899 Other long term (current) drug therapy: Secondary | ICD-10-CM | POA: Insufficient documentation

## 2020-04-20 DIAGNOSIS — Z20822 Contact with and (suspected) exposure to covid-19: Secondary | ICD-10-CM | POA: Diagnosis not present

## 2020-04-20 DIAGNOSIS — E119 Type 2 diabetes mellitus without complications: Secondary | ICD-10-CM | POA: Insufficient documentation

## 2020-04-20 DIAGNOSIS — I110 Hypertensive heart disease with heart failure: Secondary | ICD-10-CM | POA: Insufficient documentation

## 2020-04-20 DIAGNOSIS — E871 Hypo-osmolality and hyponatremia: Secondary | ICD-10-CM

## 2020-04-20 DIAGNOSIS — Z7984 Long term (current) use of oral hypoglycemic drugs: Secondary | ICD-10-CM | POA: Insufficient documentation

## 2020-04-20 DIAGNOSIS — I10 Essential (primary) hypertension: Secondary | ICD-10-CM

## 2020-04-20 DIAGNOSIS — I5032 Chronic diastolic (congestive) heart failure: Secondary | ICD-10-CM | POA: Diagnosis not present

## 2020-04-20 LAB — COMPREHENSIVE METABOLIC PANEL
ALT: 14 U/L (ref 0–44)
AST: 24 U/L (ref 15–41)
Albumin: 4 g/dL (ref 3.5–5.0)
Alkaline Phosphatase: 36 U/L — ABNORMAL LOW (ref 38–126)
Anion gap: 11 (ref 5–15)
BUN: 35 mg/dL — ABNORMAL HIGH (ref 8–23)
CO2: 23 mmol/L (ref 22–32)
Calcium: 8.3 mg/dL — ABNORMAL LOW (ref 8.9–10.3)
Chloride: 93 mmol/L — ABNORMAL LOW (ref 98–111)
Creatinine, Ser: 1.86 mg/dL — ABNORMAL HIGH (ref 0.44–1.00)
GFR calc Af Amer: 28 mL/min — ABNORMAL LOW (ref >60)
GFR calc non Af Amer: 25 mL/min — ABNORMAL LOW (ref >60)
Glucose, Bld: 110 mg/dL — ABNORMAL HIGH (ref 70–99)
Potassium: 3.5 mmol/L (ref 3.5–5.1)
Sodium: 127 mmol/L — ABNORMAL LOW (ref 135–145)
Total Bilirubin: 0.6 mg/dL (ref 0.3–1.2)
Total Protein: 7.5 g/dL (ref 6.5–8.1)

## 2020-04-20 LAB — BASIC METABOLIC PANEL
Anion gap: 12 (ref 5–15)
BUN: 33 mg/dL — ABNORMAL HIGH (ref 8–23)
CO2: 21 mmol/L — ABNORMAL LOW (ref 22–32)
Calcium: 8.6 mg/dL — ABNORMAL LOW (ref 8.9–10.3)
Chloride: 93 mmol/L — ABNORMAL LOW (ref 98–111)
Creatinine, Ser: 1.86 mg/dL — ABNORMAL HIGH (ref 0.44–1.00)
GFR calc Af Amer: 28 mL/min — ABNORMAL LOW (ref >60)
GFR calc non Af Amer: 25 mL/min — ABNORMAL LOW (ref >60)
Glucose, Bld: 169 mg/dL — ABNORMAL HIGH (ref 70–99)
Potassium: 3.8 mmol/L (ref 3.5–5.1)
Sodium: 126 mmol/L — ABNORMAL LOW (ref 135–145)

## 2020-04-20 LAB — CBC WITH DIFFERENTIAL/PLATELET
Abs Immature Granulocytes: 0.03 10*3/uL (ref 0.00–0.07)
Basophils Absolute: 0 10*3/uL (ref 0.0–0.1)
Basophils Relative: 0 %
Eosinophils Absolute: 0.1 10*3/uL (ref 0.0–0.5)
Eosinophils Relative: 1 %
HCT: 31.4 % — ABNORMAL LOW (ref 36.0–46.0)
Hemoglobin: 10.2 g/dL — ABNORMAL LOW (ref 12.0–15.0)
Immature Granulocytes: 0 %
Lymphocytes Relative: 12 %
Lymphs Abs: 0.9 10*3/uL (ref 0.7–4.0)
MCH: 29.1 pg (ref 26.0–34.0)
MCHC: 32.5 g/dL (ref 30.0–36.0)
MCV: 89.7 fL (ref 80.0–100.0)
Monocytes Absolute: 1 10*3/uL (ref 0.1–1.0)
Monocytes Relative: 13 %
Neutro Abs: 5.6 10*3/uL (ref 1.7–7.7)
Neutrophils Relative %: 74 %
Platelets: 247 10*3/uL (ref 150–400)
RBC: 3.5 MIL/uL — ABNORMAL LOW (ref 3.87–5.11)
RDW: 12.8 % (ref 11.5–15.5)
WBC: 7.6 10*3/uL (ref 4.0–10.5)
nRBC: 0 % (ref 0.0–0.2)

## 2020-04-20 LAB — URINALYSIS, ROUTINE W REFLEX MICROSCOPIC
Bilirubin Urine: NEGATIVE
Glucose, UA: NEGATIVE mg/dL
Hgb urine dipstick: NEGATIVE
Ketones, ur: NEGATIVE mg/dL
Leukocytes,Ua: NEGATIVE
Nitrite: NEGATIVE
Protein, ur: NEGATIVE mg/dL
Specific Gravity, Urine: 1.003 — ABNORMAL LOW (ref 1.005–1.030)
pH: 6 (ref 5.0–8.0)

## 2020-04-20 LAB — OSMOLALITY, URINE: Osmolality, Ur: 142 mOsm/kg — ABNORMAL LOW (ref 300–900)

## 2020-04-20 LAB — NA AND K (SODIUM & POTASSIUM), RAND UR
Potassium Urine: 8 mmol/L
Sodium, Ur: 32 mmol/L

## 2020-04-20 LAB — CBG MONITORING, ED: Glucose-Capillary: 215 mg/dL — ABNORMAL HIGH (ref 70–99)

## 2020-04-20 LAB — CREATININE, URINE, RANDOM: Creatinine, Urine: 26.58 mg/dL

## 2020-04-20 LAB — TSH: TSH: 1.641 u[IU]/mL (ref 0.350–4.500)

## 2020-04-20 LAB — CORTISOL: Cortisol, Plasma: 9.3 ug/dL

## 2020-04-20 LAB — OSMOLALITY: Osmolality: 276 mOsm/kg (ref 275–295)

## 2020-04-20 NOTE — Discharge Instructions (Addendum)
Please continue to check sugars and blood pressure at home Take medicine as prescribed I will call about blood work in a few hours if abnormal Follow up with primary care next week Go to emergency room if symptoms worsening

## 2020-04-20 NOTE — Telephone Encounter (Signed)
Patient returned call, explained worsening sodium/chloride levels since prior ED visit.  Concerned about trending downward labs.  Patient has been lightheaded/dizzy, recommended further evaluation and treatment in the emergency room as she likely needs fluids, but given setting of CHF and CKD needs close monitoring.  Expresses understanding and plans to go to [facility].  Patient reports blood pressure improving from visit earlier today.

## 2020-04-20 NOTE — ED Triage Notes (Signed)
Pt states she took her bp this morning and it was elevated. Pt states bp was 141/84. Pt states her bs was 216. Pt states legs started feeling weak yesterday. Pt states the right leg feels heavier than the left. Pt was able to walk to exam room.

## 2020-04-20 NOTE — Telephone Encounter (Signed)
Sodium and Cl trending downward, given in setting of CHF, CKD recommending further evaluation and treatment in emergency room. Attempted toSodium and Cl trending downward, given in setting of CHF, CKD recommending further evaluation and treatment in emergency room. Attempted to call x 1 , no answer. Voicemail left.  call x 1 , no answer. Voicemail left.

## 2020-04-20 NOTE — ED Provider Notes (Signed)
[place]    CSN: [id number] Arrival date & time: 04/20/20  1142      History   Chief Complaint Chief Complaint  Patient presents with  . Hypertension    HPI Krista Rosales is a 86 y.o. female history of DM type II, diastolic CHF, hypertension, glaucoma, hyperlipidemia, presenting today for evaluation of elevated blood pressure and elevated blood sugar.  Patient notes that recently her blood pressure and blood sugars have not been controlled.  She notes that of recently her pressures have been around 382 systolic.  She notes that this morning her reading was 141/63 with a heart rate of 84.  Blood sugar 216 at home.  Patient was seen in the emergency room 2 days ago for similar and had CMP with low sodium, chloride, kidney function altered and low GFR.  Blood sugars 230s.  CBC with hemoglobin of 10.8.  Chest x-ray without pulmonary edema.  At the time she was increased to taking hydralazine 100 mg 3 times daily.  She returns today as her measurements have still been uncontrolled at times.  She reports an occasional mild headache.  Reports also intermittent blurry vision.  Denies double vision or blackening of vision.  She is also felt as if her bilateral lower legs have felt very heavy and slightly weak.  Right worse than left reported.  She denies leg swelling.  Denies leg pain.  Denies chest pain or shortness of breath.  Denies headache or vision changes at current.  Does feel lightheaded/dizzy with standing up too quickly, but denies any episodes of syncope.  HPI  Past Medical History:  Diagnosis Date  . ANEMIA, IRON DEFICIENCY 05/07/2009   Qualifier: Diagnosis of  By: Loanne Drilling MD, Jacelyn Pi   . ARTHRITIS 05/30/2008   Qualifier: Diagnosis of  By: Marland Mcalpine    . DIABETES MELLITUS, TYPE II 07/16/2007   Qualifier: Diagnosis of  By: Marca Ancona RMA, Lucy    . Diastolic congestive heart failure ([place]) 09/2017  . DIVERTICULOSIS, COLON 05/30/2008   Qualifier: Diagnosis of  By: Marland Mcalpine    . Dyspnea   . Glaucoma   . HYPERLIPIDEMIA 07/16/2007   Qualifier: Diagnosis of  By: Reatha Armour, Lucy    . Hypertension   . Normocytic anemia     Patient Active Problem List   Diagnosis Date Noted  . Chest pain 11/20/2017  . Diastolic congestive heart failure ([place]) 09/30/2017  . Diabetes type 2, uncontrolled ([place])   . Headache   . Hypertensive urgency   . Hypokalemia   . Bradycardia   . Normocytic anemia   . Chronic diastolic CHF (congestive heart failure) ([place])   . Other emphysema ([place])   . Glaucoma   . Hypertension 02/20/2015  . Malignant hypertension 02/20/2015  . ANEMIA, IRON DEFICIENCY 05/07/2009  . DYSPNEA 05/07/2009  . CONSTIPATION 06/01/2008  . HEMORRHOIDS 05/30/2008  . DIVERTICULOSIS, COLON 05/30/2008  . ARTHRITIS 05/30/2008  . COUGH 02/03/2008  . Diabetes mellitus type 2 with complications ([place]) 50/53/9767  . HYPERLIPIDEMIA 07/16/2007  . Essential hypertension 07/16/2007  . Coronary atherosclerosis 07/16/2007  . ALLERGIC RHINITIS 07/16/2007  . OSTEOPOROSIS 07/16/2007    Past Surgical History:  Procedure Laterality Date  . ABDOMINAL HYSTERECTOMY      OB History   No obstetric history on file.      Home Medications    Prior to Admission medications   Medication Sig Start Date End Date Taking? Authorizing Provider  acetaminophen (TYLENOL) 500 MG  tablet Take 500 mg by mouth every 6 (six) hours as needed.    [provider]  ALPRAZolam Duanne Moron) 0.5 MG tablet Take 0.25 mg by mouth daily as needed for anxiety or sleep.  09/07/17   [provider]  amLODipine (NORVASC) 10 MG tablet Take 10 mg by mouth daily.    [provider]  aspirin 81 MG tablet Take 81 mg by mouth daily.    [provider]  cholecalciferol (VITAMIN D) 1000 UNITS tablet Take 2,000 Units by mouth daily.     [provider]  diclofenac Sodium (VOLTAREN) 1 % GEL Apply 4 g topically 4 (four) times daily. To affected joint. Patient  not taking: Reported on 04/17/2020 03/30/20   Gregor Hams, MD  doxazosin (CARDURA) 2 MG tablet Take 1 tablet (2 mg total) by mouth daily. Patient not taking: Reported on 04/17/2020 07/13/19   Lelon Perla, MD  furosemide (LASIX) 20 MG tablet One tablet once daily as needed for swelling Patient taking differently: Take 20 mg by mouth See admin instructions. Takes 1 tablet Tuesday,Thursday, Saturday and Sunday and 2 tablets on Monday, Wednesday and Friday. 11/15/18   Lelon Perla, MD  hydrALAZINE (APRESOLINE) 100 MG tablet Take 1 tablet (100 mg total) by mouth 3 (three) times daily. 04/17/20   Isla Pence, MD  loratadine (CLARITIN) 5 MG/5ML syrup Take 5 mLs (5 mg total) by mouth daily. Patient not taking: Reported on 04/17/2020 04/18/19   Gloristine Turrubiates C, PA-C  magnesium hydroxide (MILK OF MAGNESIA) 400 MG/5ML suspension Take 15 mLs by mouth daily as needed for mild constipation.    [provider]  Propylene Glycol (SYSTANE BALANCE OP) Apply 1 drop to eye daily as needed (for dry eyes).    [provider]  vitamin B-12 (CYANOCOBALAMIN) 1000 MCG tablet Take 1,000 mcg by mouth daily.    [provider]    Family History Family History  Problem Relation Age of Onset  . Diabetes Mellitus II Other   . Pancreatic cancer Mother   . Heart disease Father   . CAD Brother     Social History Social History   Tobacco Use  . Smoking status: Former Research scientist (life sciences)  . Smokeless tobacco: Never Used  Substance Use Topics  . Alcohol use: No  . Drug use: No     Allergies   Wellbutrin [bupropion], Codeine, and Penicillins   Review of Systems Review of Systems  Constitutional: Negative for fatigue and fever.  HENT: Negative for congestion, sinus pressure and sore throat.   Eyes: Negative for photophobia, pain and visual disturbance.  Respiratory: Negative for cough and shortness of breath.   Cardiovascular: Negative for chest pain.  Gastrointestinal: Negative for  abdominal pain, nausea and vomiting.  Genitourinary: Negative for decreased urine volume and hematuria.  Musculoskeletal: Negative for myalgias, neck pain and neck stiffness.  Neurological: Positive for headaches. Negative for dizziness, syncope, facial asymmetry, speech difficulty, weakness, light-headedness and numbness.     Physical Exam Triage Vital Signs ED Triage Vitals  Enc Vitals Group     BP 04/20/20 1219 (!) 169/78     Pulse Rate 04/20/20 1219 77     Resp 04/20/20 1219 16     Temp 04/20/20 1219 99.5 F (37.5 C)     Temp Source 04/20/20 1219 Oral     SpO2 04/20/20 1219 100 %     Weight 04/20/20 1220 147 lb (66.7 kg)     Height 04/20/20 1220 5\' 9"  (1.753  m)     Head Circumference --      Peak Flow --      Pain Score 04/20/20 1219 0     Pain Loc --      Pain Edu? --      Excl. in [place]? --    No data found.  Updated Vital Signs BP (!) 169/78   Pulse 77   Temp 99.5 F (37.5 C) (Oral)   Resp 16   Ht 5\' 9"  (1.753 m)   Wt 147 lb (66.7 kg)   SpO2 100%   BMI 21.71 kg/m   Visual Acuity Right Eye Distance:   Left Eye Distance:   Bilateral Distance:    Right Eye Near:   Left Eye Near:    Bilateral Near:     Physical Exam Vitals and nursing note reviewed.  Constitutional:      Appearance: She is well-developed.     Comments: No acute distress  HENT:     Head: Normocephalic and atraumatic.     Nose: Nose normal.     Mouth/Throat:     Comments: Oral mucosa pink and moist, no tonsillar enlargement or exudate. Posterior pharynx patent and nonerythematous, no uvula deviation or swelling. Normal phonation. Palate elevates symmetrically Eyes:     Extraocular Movements: Extraocular movements intact.     Conjunctiva/sclera: Conjunctivae normal.     Pupils: Pupils are equal, round, and reactive to light.     Comments: Patient does require redirection and multiple instructions when checking extraocular motions, but all intact  Cardiovascular:     Rate and Rhythm:  Normal rate.  Pulmonary:     Effort: Pulmonary effort is normal. No respiratory distress.     Comments: Breathing comfortably at rest, CTABL, no wheezing, rales or other adventitious sounds auscultated Abdominal:     General: There is no distension.  Musculoskeletal:        General: Normal range of motion.     Cervical back: Neck supple.     Comments: Bilateral lower leg symmetric without edema, no calf tenderness bilaterally  Skin:    General: Skin is warm and dry.  Neurological:     General: No focal deficit present.     Mental Status: She is alert and oriented to person, place, and time.     Comments: Patient A&O x3, cranial nerves II-XII grossly intact, strength at shoulders, hips and knees 5/5, equal bilaterally. Gait without abnormality, although slowed      UC Treatments / Results  Labs (all labs ordered are listed, but only abnormal results are displayed) Labs Reviewed  CBG MONITORING, ED - Abnormal; Notable for the following components:      Result Value   Glucose-Capillary 215 (*)    All other components within normal limits  BASIC METABOLIC PANEL  CBG MONITORING, ED    EKG   Radiology No results found.  Procedures Procedures (including critical care time)  Medications Ordered in UC Medications - No data to display  Initial Impression / Assessment and Plan / UC Course  I have reviewed the triage vital signs and the nursing notes.  Pertinent labs & imaging results that were available during my care of the patient were reviewed by me and considered in my medical decision making (see chart for details).     Patient appears to be at her baseline neurologically.  EKG also appears stable from prior EKGs.  Does have irregular PR intervals noted in various leads, but no signs of ischemia  or infarction.  Blood pressure variable, today in clinic recheck of 161/54.  Diastolic readings have trended on the lower end.  No red flag symptoms today warranting further  evaluation in emergency room for stroke/intracranial abnormality.  Did discuss these with patient advised to follow-up in emergency room if any symptoms progressing or worsening.  Advised to continue taking medicines as prescribed, recommended close follow-up with PCP next week for further evaluation of hypertension and diabetes management.  Follow-up regularly with cardiologist as planned.  Discussed strict return precautions. Patient verbalized understanding and is agreeable with plan.  Final Clinical Impressions(s) / UC Diagnoses   Final diagnoses:  Essential hypertension  Type 2 diabetes mellitus without complication, without long-term current use of insulin ([place])     Discharge Instructions     Please continue to check sugars and blood pressure at home Take medicine as prescribed I will call about blood work in a few hours if abnormal Follow up with primary care next week Go to emergency room if symptoms worsening    ED Prescriptions    None     PDMP not reviewed this encounter.   Janith Lima, [place] 04/20/20 1512

## 2020-04-20 NOTE — ED Provider Notes (Signed)
[facility] DEPT Provider Note   CSN: [id number] Arrival date & time: 04/20/20  1855     History Chief Complaint  Patient presents with  . Abnormal Lab    Krista Rosales is a 86 y.o. female.  HPI   86 year old female referred to the emergency room for further evaluation of hyponatremia.  She was seen in the emergency room a few days ago and noted to have a sodium of 129.  She had her hydralazine increased because of hypertension.  She followed up today at urgent care and had repeat labs.  Sodium now 126.  She denies any medication changes aside from the hydralazine.  Denies any acute respiratory symptoms.  No new swelling.  She says that she drinks 6-7 bottles of water per day but has done this for quite some time.  Denies any significant increase in fluid intake recently.  Vague feeling of not herself for the past few days.   Past Medical History:  Diagnosis Date  . ANEMIA, IRON DEFICIENCY 05/07/2009   Qualifier: Diagnosis of  By: Loanne Drilling MD, Jacelyn Pi   . ARTHRITIS 05/30/2008   Qualifier: Diagnosis of  By: Marland Mcalpine    . DIABETES MELLITUS, TYPE II 07/16/2007   Qualifier: Diagnosis of  By: Marca Ancona RMA, Lucy    . Diastolic congestive heart failure ([place]) 09/2017  . DIVERTICULOSIS, COLON 05/30/2008   Qualifier: Diagnosis of  By: Marland Mcalpine    . Dyspnea   . Glaucoma   . HYPERLIPIDEMIA 07/16/2007   Qualifier: Diagnosis of  By: Reatha Armour, Lucy    . Hypertension   . Normocytic anemia     Patient Active Problem List   Diagnosis Date Noted  . Chest pain 11/20/2017  . Diastolic congestive heart failure ([place]) 09/30/2017  . Diabetes type 2, uncontrolled ([place])   . Headache   . Hypertensive urgency   . Hypokalemia   . Bradycardia   . Normocytic anemia   . Chronic diastolic CHF (congestive heart failure) ([place])   . Other emphysema ([place])   . Glaucoma   . Hypertension 02/20/2015  . Malignant hypertension 02/20/2015  . ANEMIA, IRON DEFICIENCY  05/07/2009  . DYSPNEA 05/07/2009  . CONSTIPATION 06/01/2008  . HEMORRHOIDS 05/30/2008  . DIVERTICULOSIS, COLON 05/30/2008  . ARTHRITIS 05/30/2008  . COUGH 02/03/2008  . Diabetes mellitus type 2 with complications ([place]) 18/29/9371  . HYPERLIPIDEMIA 07/16/2007  . Essential hypertension 07/16/2007  . Coronary atherosclerosis 07/16/2007  . ALLERGIC RHINITIS 07/16/2007  . OSTEOPOROSIS 07/16/2007    Past Surgical History:  Procedure Laterality Date  . ABDOMINAL HYSTERECTOMY       OB History   No obstetric history on file.     Family History  Problem Relation Age of Onset  . Diabetes Mellitus II Other   . Pancreatic cancer Mother   . Heart disease Father   . CAD Brother     Social History   Tobacco Use  . Smoking status: Former Research scientist (life sciences)  . Smokeless tobacco: Never Used  Substance Use Topics  . Alcohol use: No  . Drug use: No    Home Medications Prior to Admission medications   Medication Sig Start Date End Date Taking? Authorizing Provider  amLODipine (NORVASC) 10 MG tablet Take 10 mg by mouth daily.   Yes [provider]  amLODipine (NORVASC) 5 MG tablet Take 5 mg by mouth daily.   Yes [provider]  cholecalciferol (VITAMIN D) 1000 UNITS tablet Take 2,000 Units by  mouth daily.    Yes [provider]  doxazosin (CARDURA) 2 MG tablet Take 1 tablet (2 mg total) by mouth daily. 07/13/19  Yes Lelon Perla, MD  furosemide (LASIX) 20 MG tablet One tablet once daily as needed for swelling Patient taking differently: Take 20 mg by mouth See admin instructions. Takes 1 tablet Tuesday,Thursday, Saturday and Sunday and 2 tablets on Monday, Wednesday and Friday. 11/15/18  Yes Lelon Perla, MD  hydrALAZINE (APRESOLINE) 100 MG tablet Take 1 tablet (100 mg total) by mouth 3 (three) times daily. 04/17/20  Yes Isla Pence, MD  Multiple Vitamins-Minerals (MULTIVITAMIN ADULTS) TABS Take 1 tablet by mouth daily.   Yes [provider]    vitamin B-12 (CYANOCOBALAMIN) 1000 MCG tablet Take 1,000 mcg by mouth daily.   Yes [provider]  acetaminophen (TYLENOL) 500 MG tablet Take 500 mg by mouth every 6 (six) hours as needed.    [provider]  ALPRAZolam Duanne Moron) 0.5 MG tablet Take 0.25 mg by mouth 2 (two) times daily.  09/07/17   [provider]  aspirin 81 MG tablet Take 81 mg by mouth daily.    [provider]  diclofenac Sodium (VOLTAREN) 1 % GEL Apply 4 g topically 4 (four) times daily. To affected joint. Patient not taking: Reported on 04/17/2020 03/30/20   Gregor Hams, MD  loratadine (CLARITIN) 5 MG/5ML syrup Take 5 mLs (5 mg total) by mouth daily. Patient not taking: Reported on 04/17/2020 04/18/19   Wieters, Hallie C, PA-C  magnesium hydroxide (MILK OF MAGNESIA) 400 MG/5ML suspension Take 15 mLs by mouth daily as needed for mild constipation.    [provider]  Propylene Glycol (SYSTANE BALANCE OP) Apply 1 drop to eye daily as needed (for dry eyes).    [provider]    Allergies    Wellbutrin [bupropion], Codeine, and Penicillins  Review of Systems   Review of Systems All systems reviewed and negative, other than as noted in HPI.  Physical Exam Updated Vital Signs BP (!) 200/62   Pulse 77   Temp 98.2 F (36.8 C)   Resp 17   Ht 5\' 8"  (1.727 m)   Wt 66.7 kg   SpO2 99%   BMI 22.35 kg/m   Physical Exam Vitals and nursing note reviewed.  Constitutional:      General: She is not in acute distress.    Appearance: She is well-developed.  HENT:     Head: Normocephalic and atraumatic.  Eyes:     General:        Right eye: No discharge.        Left eye: No discharge.     Conjunctiva/sclera: Conjunctivae normal.  Cardiovascular:     Rate and Rhythm: Normal rate and regular rhythm.     Heart sounds: Normal heart sounds. No murmur. No friction rub. No gallop.   Pulmonary:     Effort: Pulmonary effort is normal. No respiratory distress.     Breath  sounds: Normal breath sounds.  Abdominal:     General: There is no distension.     Palpations: Abdomen is soft.     Tenderness: There is no abdominal tenderness.  Musculoskeletal:        General: No tenderness.     Cervical back: Neck supple.     Right lower leg: No edema.     Left lower leg: No edema.  Skin:    General: Skin is warm and dry.  Neurological:  Mental Status: She is alert.  Psychiatric:        Behavior: Behavior normal.        Thought Content: Thought content normal.     ED Results / Procedures / Treatments   Labs (all labs ordered are listed, but only abnormal results are displayed) Labs Reviewed  COMPREHENSIVE METABOLIC PANEL - Abnormal; Notable for the following components:      Result Value   Sodium 127 (*)    Chloride 93 (*)    Glucose, Bld 110 (*)    BUN 35 (*)    Creatinine, Ser 1.86 (*)    Calcium 8.3 (*)    Alkaline Phosphatase 36 (*)    GFR calc non Af Amer 25 (*)    GFR calc Af Amer 28 (*)    All other components within normal limits  OSMOLALITY, URINE - Abnormal; Notable for the following components:   Osmolality, Ur 142 (*)    All other components within normal limits  URINALYSIS, ROUTINE W REFLEX MICROSCOPIC - Abnormal; Notable for the following components:   Color, Urine STRAW (*)    Specific Gravity, Urine 1.003 (*)    All other components within normal limits  CBC WITH DIFFERENTIAL/PLATELET - Abnormal; Notable for the following components:   RBC 3.50 (*)    Hemoglobin 10.2 (*)    HCT 31.4 (*)    All other components within normal limits  SARS CORONAVIRUS 2 (TAT 6-24 HRS)  CREATININE, URINE, RANDOM  NA AND K (SODIUM & POTASSIUM), RAND UR  CORTISOL  OSMOLALITY  TSH    EKG None  Radiology No results found.  Procedures Procedures (including critical care time)  Medications Ordered in ED Medications - No data to display  ED Course  I have reviewed the triage vital signs and the nursing notes.  Pertinent labs & imaging  results that were available during my care of the patient were reviewed by me and considered in my medical decision making (see chart for details).    MDM Rules/Calculators/A&P                      86 year old female with hyponatremia.  Sodium was 129 mmol/L three days ago and 126 today. Repeat 127. Questionable if actually symptomatic. Vague symptoms of not feeling quite well. She is very lucid.   No obvious offending medications.  She does not appear to be overtly volume overloaded. Drinks several bottles of water per day but says she has done this for a long time.   Will have her cut back her water consumption and have repeat BMP next week. She says she drinks 6-7 bottles of water every day. Advised her to cut it back to 2-3. Emergent return precautions discussed. PCP follow-up otherwise.   Final Clinical Impression(s) / ED Diagnoses Final diagnoses:  Hyponatremia    Rx / DC Orders ED Discharge Orders    None       Virgel Manifold, MD 04/22/20 1554

## 2020-04-20 NOTE — ED Triage Notes (Signed)
Patient states urgent care sent her to the ED because her blood pressure is high, sugar high, and her sodium is high.

## 2020-04-20 NOTE — Discharge Instructions (Signed)
I want you to drink LESS water over the weekend. If you are normally drinking 6-7 bottles of water every day then I want you drinking 2-3. I want you to follow-up again next week with your PCP and have repeat blood work.

## 2020-04-21 LAB — SARS CORONAVIRUS 2 (TAT 6-24 HRS): SARS Coronavirus 2: NEGATIVE

## 2020-04-26 ENCOUNTER — Ambulatory Visit: Payer: Self-pay

## 2020-04-26 ENCOUNTER — Encounter: Payer: Self-pay | Admitting: Family Medicine

## 2020-04-26 ENCOUNTER — Ambulatory Visit (INDEPENDENT_AMBULATORY_CARE_PROVIDER_SITE_OTHER): Payer: Medicare Other | Admitting: Family Medicine

## 2020-04-26 ENCOUNTER — Other Ambulatory Visit: Payer: Self-pay

## 2020-04-26 VITALS — BP 144/72 | HR 90 | Ht 68.0 in | Wt 154.0 lb

## 2020-04-26 DIAGNOSIS — M1712 Unilateral primary osteoarthritis, left knee: Secondary | ICD-10-CM

## 2020-04-26 DIAGNOSIS — M25562 Pain in left knee: Secondary | ICD-10-CM | POA: Diagnosis not present

## 2020-04-26 DIAGNOSIS — G8929 Other chronic pain: Secondary | ICD-10-CM | POA: Diagnosis not present

## 2020-04-26 NOTE — Progress Notes (Signed)
I, [name], LAT, ATC, am serving as scribe for Dr. [name].  Krista Rosales is a 86 y.o. female who presents to [facility] today for f/u of L knee pain.  She was last seen by Dr. Georgina Snell on 03/30/20 and was referred to home health PT and was advised to use Voltaren gel.  Since her last visit, pt reports that her pain increased yesterday around the patella. Denies any falls or hitting that area. Constant, 10/10. Is using Voltaren gel for pain relief.  No injury  Diagnostic imaging: L knee XR- 03/30/20   Pertinent review of systems: No fevers or chills  Relevant historical information: Left knee osteoarthritis   Exam:  BP (!) 144/72   Pulse 90   Ht 5\' 8"  (1.727 m)   Wt 154 lb (69.9 kg)   SpO2 97%   BMI 23.42 kg/m  General: Well Developed, well nourished, and in no acute distress.   MSK: Left knee moderate effusion otherwise normal-appearing Range of motion 5-100 degrees with crepitation. Tender palpation along anterior knee. Stable ligamentous exam.  Intact strength.    Lab and Radiology Results  Procedure: Real-time Ultrasound Guided Injection of left knee superior patellar space Device: Philips Affiniti 50G Images permanently stored and available for review in the ultrasound unit. Verbal informed consent obtained.  Discussed risks and benefits of procedure. Warned about infection bleeding damage to structures skin hypopigmentation and fat atrophy among others. Patient expresses understanding and agreement Time-out conducted.   Noted no overlying erythema, induration, or other signs of local infection.   Skin prepped in a sterile fashion.   Local anesthesia: Topical Ethyl chloride.   With sterile technique and under real time ultrasound guidance:  40 mg of Kenalog and 2 mL of Marcaine injected easily.   Completed without difficulty   Pain immediately resolved suggesting accurate placement of the medication.   Advised to call if  fevers/chills, erythema, induration, drainage, or persistent bleeding.   Images permanently stored and available for review in the ultrasound unit.  Impression: Technically successful ultrasound guided injection.         Assessment and Plan: 86 y.o. female with left knee pain due to osteoarthritis.  Exacerbation of chronic pain.  Discussed options.  She might be a good candidate for hyaluronic acid however this has not been approved yet.  Work on authorization now.  She notes that " we have to do something" because the pain is quite bothersome today.  Reasonable to proceed with repeat steroid injection.  This may be somewhat helpful at least in the short-term.  Not optimistic that this is a good long-term solution.  Hopefully the pain will be better controlled at least until we can get hyaluronic acid injections authorized.  Additionally of note patient has had several emergency room visits since her last visit in 1 month.  She has not yet contacted her primary care office to schedule follow-up.  I stressed the importance of this and encouraged her to contact her PCPs office ASAP.   PDMP not reviewed this encounter. Orders Placed This Encounter  Procedures  . [place] LIMITED JOINT SPACE STRUCTURES LOW LEFT(NO LINKED CHARGES)    Order Specific Question:   Reason for Exam (SYMPTOM  OR DIAGNOSIS REQUIRED)    Answer:   eval left knee    Order Specific Question:   Preferred imaging location?    Answer:   [facility]   No orders of the defined types  were placed in this encounter.    Discussed warning signs or symptoms. Please see discharge instructions. Patient expresses understanding.   The above documentation has been reviewed and is accurate and complete [name]

## 2020-04-26 NOTE — Patient Instructions (Signed)
Check back as needed Will let you know once gel is approved

## 2020-04-27 ENCOUNTER — Telehealth: Payer: Self-pay

## 2020-04-27 NOTE — Telephone Encounter (Signed)
Dionca from [facility] home health called stating patient is refusing the PT. Just wanted [place] to be aware

## 2020-04-27 NOTE — Telephone Encounter (Signed)
Noted we will address this at follow-up visit on the 10th. Krista Rosales is old enough to make decisions for herself if she does not want physical therapy that is okay.

## 2020-04-30 ENCOUNTER — Ambulatory Visit: Payer: Medicare Other | Admitting: Family Medicine

## 2020-05-02 ENCOUNTER — Ambulatory Visit (INDEPENDENT_AMBULATORY_CARE_PROVIDER_SITE_OTHER): Payer: Medicare Other | Admitting: Family Medicine

## 2020-05-02 ENCOUNTER — Ambulatory Visit: Payer: Self-pay

## 2020-05-02 ENCOUNTER — Other Ambulatory Visit: Payer: Self-pay

## 2020-05-02 DIAGNOSIS — M25562 Pain in left knee: Secondary | ICD-10-CM

## 2020-05-02 DIAGNOSIS — M1712 Unilateral primary osteoarthritis, left knee: Secondary | ICD-10-CM | POA: Diagnosis not present

## 2020-05-02 DIAGNOSIS — G8929 Other chronic pain: Secondary | ICD-10-CM | POA: Diagnosis not present

## 2020-05-02 NOTE — Progress Notes (Signed)
Patient presents to clinic today for previously arranged Gelsyn injection left knee 1/3  Procedure: Real-time Ultrasound Guided Injection of left knee Gelsyn Device: Philips Affiniti 50G Images permanently stored and available for review in the ultrasound unit. Verbal informed consent obtained.  Discussed risks and benefits of procedure. Warned about infection bleeding damage to structures skin hypopigmentation and fat atrophy among others. Patient expresses understanding and agreement Time-out conducted.   Noted no overlying erythema, induration, or other signs of local infection.   Skin prepped in a sterile fashion.   Local anesthesia: Topical Ethyl chloride.   With sterile technique and under real time ultrasound guidance:  Gelsyn injected easily.   Completed without difficulty      Advised to call if fevers/chills, erythema, induration, drainage, or persistent bleeding.   Images permanently stored and available for review in the ultrasound unit.  Impression: Technically successful ultrasound guided injection.   Lot #[id number]  Return 1 week for injection left knee 2/3

## 2020-05-02 NOTE — Patient Instructions (Signed)
Thank you for coming in today. Recheck in 1 and 2 weeks for injections number 2 and 3.  Call or go to the ER if you develop a large red swollen joint with extreme pain or oozing puss.

## 2020-05-09 ENCOUNTER — Other Ambulatory Visit: Payer: Self-pay

## 2020-05-09 ENCOUNTER — Ambulatory Visit (INDEPENDENT_AMBULATORY_CARE_PROVIDER_SITE_OTHER): Payer: Medicare Other | Admitting: Family Medicine

## 2020-05-09 ENCOUNTER — Ambulatory Visit: Payer: Self-pay

## 2020-05-09 ENCOUNTER — Encounter: Payer: Self-pay | Admitting: Family Medicine

## 2020-05-09 DIAGNOSIS — M25562 Pain in left knee: Secondary | ICD-10-CM

## 2020-05-09 DIAGNOSIS — G8929 Other chronic pain: Secondary | ICD-10-CM | POA: Diagnosis not present

## 2020-05-09 NOTE — Progress Notes (Signed)
Patient presents to clinic today for Gelsyn injection left knee 2/3  Procedure: Real-time Ultrasound Guided Injection of left knee Device: Philips Affiniti 50G Images permanently stored and available for review in the ultrasound unit. Verbal informed consent obtained.  Discussed risks and benefits of procedure. Warned about infection bleeding damage to structures skin hypopigmentation and fat atrophy among others. Patient expresses understanding and agreement Time-out conducted.   Noted no overlying erythema, induration, or other signs of local infection.   Skin prepped in a sterile fashion.   Local anesthesia: Topical Ethyl chloride.   With sterile technique and under real time ultrasound guidance:  Gelsyn injected easily.   Completed without difficulty      Advised to call if fevers/chills, erythema, induration, drainage, or persistent bleeding.   Images permanently stored and available for review in the ultrasound unit.  Impression: Technically successful ultrasound guided injection.  Lot number [id number]  Return 1 week for left knee injection Gelsyn 3/3  Of note Nechama mentions that she has been stressed recently because her brother has been hospitalized with metastatic cancer and is not doing well.

## 2020-05-09 NOTE — Patient Instructions (Signed)
Thank you for coming in today. I am sorry about your brother.  Recheck in 1 week for the 3rd shot.  I can answer some general questions about hospice.

## 2020-05-16 ENCOUNTER — Ambulatory Visit (INDEPENDENT_AMBULATORY_CARE_PROVIDER_SITE_OTHER): Payer: Medicare Other | Admitting: Family Medicine

## 2020-05-16 ENCOUNTER — Ambulatory Visit: Payer: Self-pay

## 2020-05-16 ENCOUNTER — Other Ambulatory Visit: Payer: Self-pay

## 2020-05-16 DIAGNOSIS — G8929 Other chronic pain: Secondary | ICD-10-CM

## 2020-05-16 DIAGNOSIS — M25562 Pain in left knee: Secondary | ICD-10-CM

## 2020-05-16 DIAGNOSIS — M1712 Unilateral primary osteoarthritis, left knee: Secondary | ICD-10-CM | POA: Diagnosis not present

## 2020-05-16 NOTE — Progress Notes (Signed)
Krista Rosales presents to clinic today for previously arranged Gelsyn injection left knee 3/3  Procedure: Real-time Ultrasound Guided Injection of left knee Device: Philips Affiniti 50G Images permanently stored and available for review in the ultrasound unit. Verbal informed consent obtained.  Discussed risks and benefits of procedure. Warned about infection bleeding damage to structures skin hypopigmentation and fat atrophy among others. Patient expresses understanding and agreement Time-out conducted.   Noted no overlying erythema, induration, or other signs of local infection.   Skin prepped in a sterile fashion.   Local anesthesia: Topical Ethyl chloride.   With sterile technique and under real time ultrasound guidance:  Gelsyn injected easily.   Completed without difficulty    Advised to call if fevers/chills, erythema, induration, drainage, or persistent bleeding.   Images permanently stored and available for review in the ultrasound unit.  Impression: Technically successful ultrasound guided injection.   Lot #[id number]  Recheck as needed

## 2020-05-16 NOTE — Patient Instructions (Signed)
You had a L knee injection today.  Call or go to the ER if you develop a large red swollen joint with extreme pain or oozing puss.    

## 2020-06-27 ENCOUNTER — Ambulatory Visit: Payer: Self-pay

## 2020-06-27 ENCOUNTER — Ambulatory Visit: Payer: Medicare Other | Admitting: Family Medicine

## 2020-06-27 ENCOUNTER — Encounter: Payer: Self-pay | Admitting: Family Medicine

## 2020-06-27 ENCOUNTER — Other Ambulatory Visit: Payer: Self-pay

## 2020-06-27 VITALS — BP 138/62 | HR 73 | Ht 68.0 in | Wt 152.2 lb

## 2020-06-27 DIAGNOSIS — M25562 Pain in left knee: Secondary | ICD-10-CM

## 2020-06-27 DIAGNOSIS — G8929 Other chronic pain: Secondary | ICD-10-CM

## 2020-06-27 MED ORDER — DICLOFENAC SODIUM 1 % EX GEL: 4.0000 g | Freq: Four times a day (QID) | CUTANEOUS | 11 refills | Status: DC

## 2020-06-27 NOTE — Progress Notes (Signed)
I, [name], LAT, ATC, am serving as scribe for Dr. [name].  Krista Rosales is a 86 y.o. female who presents to [place] at [facility] today for f/u of L knee pain.  She was last seen by Dr. Georgina Snell on 05/16/20 and had her 3rd and final Gelsyn injection. Since her last visit, pt reports that her L knee pain has returned since last Wednesday.  She locates her pain to her L anterior knee but denies any swelling.  She notes increased pain w/ transitioning from sit-to-stand.  She did have a trial of home with physical therapy which she found not to be very helpful at all as they did not do much with her.  He states that she would like to avoid a total knee replacement.  Diagnostic testing: L knee XR- 03/30/20   Pertinent review of systems: No fevers or chills  Relevant historical information: Diastolic heart failure hypertension emphysema   Exam:  BP 138/62 (BP Location: Left Arm, Patient Position: Sitting, Cuff Size: Normal)   Pulse 73   Ht 5\' 8"  (1.727 m)   Wt 152 lb 3.2 oz (69 kg)   SpO2 97%   BMI 23.14 kg/m  General: Well Developed, well nourished, and in no acute distress.   MSK: Left knee mature scar overlying anterior lateral knee. Nontender. Normal motion with crepitation. Stable ligamentous exam.    Lab and Radiology Results  Procedure: Real-time Ultrasound Guided Injection of left knee superior patellar space Device: Philips Affiniti 50G Images permanently stored and available for review in the ultrasound unit. Verbal informed consent obtained.  Discussed risks and benefits of procedure. Warned about infection bleeding damage to structures skin hypopigmentation and fat atrophy among others. Patient expresses understanding and agreement Time-out conducted.   Noted no overlying erythema, induration, or other signs of local infection.   Skin prepped in a sterile fashion.   Local anesthesia: Topical Ethyl chloride.   With sterile technique and  under real time ultrasound guidance:  40 mg of Kenalog and 2 mL of Marcaine injected easily.   Completed without difficulty   Pain immediately resolved suggesting accurate placement of the medication.   Advised to call if fevers/chills, erythema, induration, drainage, or persistent bleeding.   Images permanently stored and available for review in the ultrasound unit.  Impression: Technically successful ultrasound guided injection.      Assessment and Plan: 86 y.o. female with persistent left knee pain.  Fundamentally patient has degenerative changes that are causing her knee pain.  Majority of her pain today seems to be patellofemoral related.  She is effectively failing conservative management.  She is already had a trial of home health physical therapy and hyaluronic acid injections which did not help.  Steroid injections only provide temporary benefit.  Last one was performed about 2-1/2 months ago.  Discussed that at this point steroid injections are not going to be a good long-term solution but are reasonable to do about every 3 months.  We will reperform a steroid injection today.  Also reemphasized quad strengthening exercises which should be helpful for patellofemoral related pain.  Stated that without a knee replacement is unlikely that she is going to have good resolution of her knee pain.  She very much would like to avoid total knee replacement.  Refill Voltaren gel recheck 3 months.   PDMP not reviewed this encounter. Orders Placed This Encounter  Procedures  . [place] LIMITED JOINT SPACE STRUCTURES LOW LEFT(NO LINKED CHARGES)  Order Specific Question:   Reason for Exam (SYMPTOM  OR DIAGNOSIS REQUIRED)    Answer:   k    Order Specific Question:   Preferred imaging location?    Answer:   [place]   Meds ordered this encounter  Medications  . diclofenac Sodium (VOLTAREN) 1 % GEL    Sig: Apply 4 g topically 4 (four) times daily. To affected joint.     Dispense:  400 g    Refill:  11    KNee OA. Failed ibuprofen and aleve and tylenol     Discussed warning signs or symptoms. Please see discharge instructions. Patient expresses understanding.   The above documentation has been reviewed and is accurate and complete [name], M.D.  Total encounter time 30 minutes including charting time date of service. Discussion regarding plan

## 2020-06-27 NOTE — Patient Instructions (Signed)
Use Voltaren gel  Exercises 3x a week See me back in 3 months

## 2020-07-07 ENCOUNTER — Other Ambulatory Visit: Payer: Self-pay | Admitting: Cardiology

## 2020-08-08 NOTE — Progress Notes (Signed)
HPI: FUbradycardia. Nuclear study January 2019 showed ejection fraction 46% and no ischemia or infarction. Ejection fraction felt better than calculated number. Echocardiogram inJanuary 2020 showed normal LV function, moderate left ventricular hypertrophy, mild diastolic dysfunction,mild mitral regurgitation.Monitor August 2020 showed sinus bradycardia with first-degree AV block, normal sinus rhythm, sinus tachycardia, intermittent Mobitz 1 second-degree AV block, multiple pauses with longest being 3.4 seconds, occasional PAC and PVC. Clonidine patch weanedto off. Since last seenpatient denies dyspnea, chest pain, palpitations or syncope.  Current Outpatient Medications  Medication Sig Dispense Refill  . acetaminophen (TYLENOL) 500 MG tablet Take 500 mg by mouth every 6 (six) hours as needed for moderate pain.     Marland Kitchen ALPRAZolam (XANAX) 0.5 MG tablet Take 0.25 mg by mouth 2 (two) times daily.   0  . amLODipine (NORVASC) 10 MG tablet Take 10 mg by mouth daily.    Marland Kitchen amLODipine (NORVASC) 5 MG tablet Take 5 mg by mouth daily.    . cholecalciferol (VITAMIN D) 1000 UNITS tablet Take 2,000 Units by mouth daily.     . diclofenac Sodium (VOLTAREN) 1 % GEL Apply 4 g topically 4 (four) times daily. To affected joint. 400 g 11  . doxazosin (CARDURA) 2 MG tablet TAKE 1 TABLET(2 MG) BY MOUTH DAILY 90 tablet 3  . furosemide (LASIX) 20 MG tablet One tablet once daily as needed for swelling (Patient taking differently: Take 20 mg by mouth See admin instructions. Takes 1 tablet Tuesday,Thursday, Saturday and Sunday and 2 tablets on Monday, Wednesday and Friday.) 30 tablet 4  . hydrALAZINE (APRESOLINE) 100 MG tablet Take 1 tablet (100 mg total) by mouth 3 (three) times daily. 30 tablet 0  . Lancets (ONETOUCH DELICA PLUS PNPYYF11M) MISC Apply 1 each topically 3 (three) times daily.    Marland Kitchen loratadine (CLARITIN) 5 MG/5ML syrup Take 5 mLs (5 mg total) by mouth daily. 60 mL 0  . magnesium hydroxide (MILK OF  MAGNESIA) 400 MG/5ML suspension Take 15 mLs by mouth daily as needed for mild constipation.    . Multiple Vitamins-Minerals (MULTIVITAMIN ADULTS) TABS Take 1 tablet by mouth daily.    Glory Rosebush VERIO test strip 1 each 3 (three) times daily.    Marland Kitchen Propylene Glycol (SYSTANE BALANCE OP) Apply 1 drop to eye daily as needed (for dry eyes).    . vitamin B-12 (CYANOCOBALAMIN) 1000 MCG tablet Take 1,000 mcg by mouth daily.     No current facility-administered medications for this visit.     Past Medical History:  Diagnosis Date  . ANEMIA, IRON DEFICIENCY 05/07/2009   Qualifier: Diagnosis of  By: Loanne Drilling MD, Jacelyn Pi   . ARTHRITIS 05/30/2008   Qualifier: Diagnosis of  By: Marland Mcalpine    . DIABETES MELLITUS, TYPE II 07/16/2007   Qualifier: Diagnosis of  By: Marca Ancona RMA, Lucy    . Diastolic congestive heart failure ([place]) 09/2017  . DIVERTICULOSIS, COLON 05/30/2008   Qualifier: Diagnosis of  By: Marland Mcalpine    . Dyspnea   . Glaucoma   . HYPERLIPIDEMIA 07/16/2007   Qualifier: Diagnosis of  By: Reatha Armour, Lucy    . Hypertension   . Normocytic anemia     Past Surgical History:  Procedure Laterality Date  . ABDOMINAL HYSTERECTOMY      Social History   Socioeconomic History  . Marital status: Widowed    Spouse name: Not on file  . Number of children: Not on file  . Years of education: Not on  file  . Highest education level: Not on file  Occupational History  . Not on file  Tobacco Use  . Smoking status: Former Research scientist (life sciences)  . Smokeless tobacco: Never Used  Vaping Use  . Vaping Use: Never used  Substance and Sexual Activity  . Alcohol use: No  . Drug use: No  . Sexual activity: Not on file  Other Topics Concern  . Not on file  Social History Narrative   Widowed.  No children.  Has a brother.     Social Determinants of Health   Financial Resource Strain:   . Difficulty of Paying Living Expenses: Not on file  Food Insecurity:   . Worried About Charity fundraiser in the Last  Year: Not on file  . Ran Out of Food in the Last Year: Not on file  Transportation Needs:   . Lack of Transportation (Medical): Not on file  . Lack of Transportation (Non-Medical): Not on file  Physical Activity:   . Days of Exercise per Week: Not on file  . Minutes of Exercise per Session: Not on file  Stress:   . Feeling of Stress : Not on file  Social Connections:   . Frequency of Communication with Friends and Family: Not on file  . Frequency of Social Gatherings with Friends and Family: Not on file  . Attends Religious Services: Not on file  . Active Member of Clubs or Organizations: Not on file  . Attends Archivist Meetings: Not on file  . Marital Status: Not on file  Intimate Partner Violence:   . Fear of Current or Ex-Partner: Not on file  . Emotionally Abused: Not on file  . Physically Abused: Not on file  . Sexually Abused: Not on file    Family History  Problem Relation Age of Onset  . Diabetes Mellitus II Other   . Pancreatic cancer Mother   . Heart disease Father   . CAD Brother     ROS: no fevers or chills, productive cough, hemoptysis, dysphasia, odynophagia, melena, hematochezia, dysuria, hematuria, rash, seizure activity, orthopnea, PND, pedal edema, claudication. Remaining systems are negative.  Physical Exam: Well-developed well-nourished in no acute distress.  Skin is warm and dry.  HEENT is normal.  Neck is supple.  Chest is clear to auscultation with normal expansion.  Cardiovascular exam is regular rate and rhythm.  2/6 systolic ejection murmur Abdominal exam nontender or distended. No masses palpated. Extremities show no edema. neuro grossly intact  A/P  1 chronic diastolic congestive heart failure-patient appears to be euvolemic.  Continue diuretic at present dose.  2 hypertension-blood pressure controlled.  Continue present medications and follow.  3 history of bradycardia-clonidine was discontinued and her heart rate did  increase.  She denies any history of dizziness or syncope at present.  We will continue to follow.  4 mitral regurgitation-mild on most recent echo.  5 chronic stage III kidney disease-followed by nephrology.  Kirk Ruths, MD

## 2020-08-13 ENCOUNTER — Other Ambulatory Visit: Payer: Self-pay

## 2020-08-13 ENCOUNTER — Ambulatory Visit: Payer: Medicare Other | Admitting: Cardiology

## 2020-08-13 ENCOUNTER — Encounter: Payer: Self-pay | Admitting: Cardiology

## 2020-08-13 VITALS — BP 130/70 | HR 90 | Temp 98.2°F | Ht 67.0 in | Wt 148.0 lb

## 2020-08-13 DIAGNOSIS — I10 Essential (primary) hypertension: Secondary | ICD-10-CM

## 2020-08-13 DIAGNOSIS — R001 Bradycardia, unspecified: Secondary | ICD-10-CM

## 2020-08-13 DIAGNOSIS — I5032 Chronic diastolic (congestive) heart failure: Secondary | ICD-10-CM | POA: Diagnosis not present

## 2020-08-13 NOTE — Patient Instructions (Signed)

## 2020-09-27 ENCOUNTER — Encounter: Payer: Self-pay | Admitting: Family Medicine

## 2020-09-27 ENCOUNTER — Other Ambulatory Visit: Payer: Self-pay

## 2020-09-27 ENCOUNTER — Ambulatory Visit: Payer: Self-pay

## 2020-09-27 ENCOUNTER — Ambulatory Visit (INDEPENDENT_AMBULATORY_CARE_PROVIDER_SITE_OTHER): Payer: Medicare Other | Admitting: Family Medicine

## 2020-09-27 VITALS — BP 150/70 | HR 65 | Ht 67.0 in | Wt 148.4 lb

## 2020-09-27 DIAGNOSIS — G8929 Other chronic pain: Secondary | ICD-10-CM

## 2020-09-27 DIAGNOSIS — M25561 Pain in right knee: Secondary | ICD-10-CM | POA: Diagnosis not present

## 2020-09-27 DIAGNOSIS — M1712 Unilateral primary osteoarthritis, left knee: Secondary | ICD-10-CM | POA: Diagnosis not present

## 2020-09-27 DIAGNOSIS — M25562 Pain in left knee: Secondary | ICD-10-CM

## 2020-09-27 NOTE — Patient Instructions (Signed)
Thank you for coming in today.   Call or go to the ER if you develop a large red swollen joint with extreme pain or oozing puss.    Recheck in 3 months.  

## 2020-09-27 NOTE — Progress Notes (Signed)
I, [name], LAT, ATC, am serving as scribe for Dr. [name].  Krista Rosales is a 86 y.o. female who presents to [place] at [facility] today for f/u of chronic L knee pain.  She was last seen by Dr. Georgina Snell on 06/27/20 and had a L knee injection and was advised to use Voltaren gel.  She completed a prior Gelsyn injection series on 05/16/20.  Since her last visit, pt reports that her L knee pain comes and goes and notes new R anterior knee pain.  She has some L knee swelling but denies any R knee swelling.  She notes the left knee distorted recently.  The steroid injection of the left knee lasted about 3 months.  She would like a repeat third injection left knee and because her right knee is hurting she would like a right knee injection as well.  Diagnostic testing: L knee XR- 03/30/20   Pertinent review of systems: No fevers or chills  Relevant historical information: Type 2 diabetes well controlled.  Hypertension.   Exam:  BP (!) 150/70 (BP Location: Left Arm, Patient Position: Sitting, Cuff Size: Normal)   Pulse 65   Ht 5\' 7"  (1.702 m)   Wt 148 lb 6.4 oz (67.3 kg)   SpO2 95%   BMI 23.24 kg/m  General: Well Developed, well nourished, and in no acute distress.   MSK: Right knee minimal effusion otherwise normal-appearing Tender palpation anterior medial knee.  Normal motion with crepitation.  Stable ligamentous exam.  Left knee moderate effusion otherwise normal-appearing Tender palpation medial knee.  Normal motion with crepitation.  Stable ligamentous exam.  Intact strength and gait bilaterally.    Lab and Radiology Results  Procedure: Real-time Ultrasound Guided Injection of right knee superior patellar space laterally Device: Philips Affiniti 50G Images permanently stored and available for review in PACS Verbal informed consent obtained.  Discussed risks and benefits of procedure. Warned about infection bleeding damage to structures skin  hypopigmentation and fat atrophy among others. Patient expresses understanding and agreement Time-out conducted.   Noted no overlying erythema, induration, or other signs of local infection.   Skin prepped in a sterile fashion.   Local anesthesia: Topical Ethyl chloride.   With sterile technique and under real time ultrasound guidance:  40 mg of Kenalog and 2 mL of Marcaine injected into joint. Fluid seen entering the joint capsule.   Completed without difficulty   Pain immediately resolved suggesting accurate placement of the medication.   Advised to call if fevers/chills, erythema, induration, drainage, or persistent bleeding.   Images permanently stored and available for review in the ultrasound unit.  Impression: Technically successful ultrasound guided injection.   Procedure: Real-time Ultrasound Guided Injection of left knee superior lateral patellar space Device: Philips Affiniti 50G Images permanently stored and available for review in PACS Verbal informed consent obtained.  Discussed risks and benefits of procedure. Warned about infection bleeding damage to structures skin hypopigmentation and fat atrophy among others. Patient expresses understanding and agreement Time-out conducted.   Noted no overlying erythema, induration, or other signs of local infection.   Skin prepped in a sterile fashion.   Local anesthesia: Topical Ethyl chloride.   With sterile technique and under real time ultrasound guidance:  40 mg of Kenalog and 2 mL of Marcaine injected into joint. Fluid seen entering the joint capsule.   Completed without difficulty   Pain immediately resolved suggesting accurate placement of the medication.   Advised to call if  fevers/chills, erythema, induration, drainage, or persistent bleeding.   Images permanently stored and available for review in the ultrasound unit.  Impression: Technically successful ultrasound guided injection.      Assessment and Plan: 86 y.o.  female with knee pain bilaterally left worse than right due to DJD.  Fortunately the steroid injections are lasting around 3 months.  We can continue to do this until they do not work any longer.  Ultimately she likely will require total knee replacement but she would like to try to avoid that if possible.  We have already tried hyaluronic acid injections in the left knee which did not help much.  Would not recommend repeating.  Recheck back in 3 months.  Return sooner if needed.   PDMP not reviewed this encounter. Orders Placed This Encounter  Procedures  . [place] LIMITED JOINT SPACE STRUCTURES LOW LEFT(NO LINKED CHARGES)    Order Specific Question:   Reason for Exam (SYMPTOM  OR DIAGNOSIS REQUIRED)    Answer:   L knee pain    Order Specific Question:   Preferred imaging location?    Answer:   [place]   No orders of the defined types were placed in this encounter.    Discussed warning signs or symptoms. Please see discharge instructions. Patient expresses understanding.   The above documentation has been reviewed and is accurate and complete [name], M.D.

## 2020-10-17 ENCOUNTER — Ambulatory Visit
Admission: EM | Admit: 2020-10-17 | Discharge: 2020-10-17 | Disposition: A | Discharge status: Home / Self Care | Payer: Medicare Other | Attending: Emergency Medicine | Admitting: Emergency Medicine

## 2020-10-17 ENCOUNTER — Other Ambulatory Visit: Payer: Self-pay

## 2020-10-17 DIAGNOSIS — I160 Hypertensive urgency: Secondary | ICD-10-CM | POA: Diagnosis not present

## 2020-10-17 DIAGNOSIS — M25442 Effusion, left hand: Secondary | ICD-10-CM

## 2020-10-17 DIAGNOSIS — M79645 Pain in left finger(s): Secondary | ICD-10-CM | POA: Diagnosis not present

## 2020-10-17 MED ORDER — DOXYCYCLINE HYCLATE 100 MG PO CAPS
100.0000 mg | ORAL_CAPSULE | Freq: Two times a day (BID) | ORAL | 0 refills | Status: AC
Start: 2020-10-17 — End: 2020-10-24

## 2020-10-17 NOTE — ED Provider Notes (Signed)
[facility]    CSN: [id number] Arrival date & time: 10/17/20  1524      History   Chief Complaint Chief Complaint  Patient presents with  . Hand Pain    HPI Krista Rosales is a 86 y.o. female  With history as below presenting for left middle finger PIP joint pain and swelling.  Has been going on since Sunday.  Denies injury, inciting event.  No recent change in diet, lifestyle, medications.  No preceding rash or pruritus.  Has not taken thing for this. Of note, patient's blood pressure is significantly elevated in office today: Has not yet taken her blood pressure medications.  Has a way to monitor blood pressure at home.  Followed routinely by her PCP for this.  Past Medical History:  Diagnosis Date  . ANEMIA, IRON DEFICIENCY 05/07/2009   Qualifier: Diagnosis of  By: Loanne Drilling MD, Jacelyn Pi   . ARTHRITIS 05/30/2008   Qualifier: Diagnosis of  By: Marland Mcalpine    . DIABETES MELLITUS, TYPE II 07/16/2007   Qualifier: Diagnosis of  By: Marca Ancona RMA, Lucy    . Diastolic congestive heart failure ([place]) 09/2017  . DIVERTICULOSIS, COLON 05/30/2008   Qualifier: Diagnosis of  By: Marland Mcalpine    . Dyspnea   . Glaucoma   . HYPERLIPIDEMIA 07/16/2007   Qualifier: Diagnosis of  By: Reatha Armour, Lucy    . Hypertension   . Normocytic anemia     Patient Active Problem List   Diagnosis Date Noted  . Chest pain 11/20/2017  . Diastolic congestive heart failure ([place]) 09/30/2017  . Headache   . Hypertensive urgency   . Hypokalemia   . Bradycardia   . Normocytic anemia   . Chronic diastolic CHF (congestive heart failure) ([place])   . Other emphysema ([place])   . Glaucoma   . Hypertension 02/20/2015  . Malignant hypertension 02/20/2015  . ANEMIA, IRON DEFICIENCY 05/07/2009  . DYSPNEA 05/07/2009  . CONSTIPATION 06/01/2008  . HEMORRHOIDS 05/30/2008  . DIVERTICULOSIS, COLON 05/30/2008  . ARTHRITIS 05/30/2008  . COUGH 02/03/2008  . Diabetes mellitus type 2 with complications  ([place]) 36/62/9476  . HYPERLIPIDEMIA 07/16/2007  . Essential hypertension 07/16/2007  . Coronary atherosclerosis 07/16/2007  . ALLERGIC RHINITIS 07/16/2007  . OSTEOPOROSIS 07/16/2007    Past Surgical History:  Procedure Laterality Date  . ABDOMINAL HYSTERECTOMY      OB History   No obstetric history on file.      Home Medications    Prior to Admission medications   Medication Sig Start Date End Date Taking? Authorizing Provider  acetaminophen (TYLENOL) 500 MG tablet Take 500 mg by mouth every 6 (six) hours as needed for moderate pain.    Yes [provider]  ALPRAZolam Duanne Moron) 0.5 MG tablet Take 0.25 mg by mouth 2 (two) times daily.  09/07/17  Yes [provider]  cholecalciferol (VITAMIN D) 1000 UNITS tablet Take 2,000 Units by mouth daily.    Yes [provider]  furosemide (LASIX) 20 MG tablet One tablet once daily as needed for swelling Patient taking differently: Take 20 mg by mouth See admin instructions. Takes 1 tablet Tuesday,Thursday, Saturday and Sunday and 2 tablets on Monday, Wednesday and Friday. 11/15/18  Yes Lelon Perla, MD  hydrALAZINE (APRESOLINE) 100 MG tablet Take 1 tablet (100 mg total) by mouth 3 (three) times daily. 04/17/20  Yes Isla Pence, MD  [facility] VERIO test strip 1 each 3 (three) times daily. 07/17/20  Yes [provider]  vitamin B-12 (CYANOCOBALAMIN) 1000 MCG tablet Take 1,000 mcg by mouth daily.   Yes [provider]  amLODipine (NORVASC) 10 MG tablet Take 10 mg by mouth daily.    [provider]  amLODipine (NORVASC) 5 MG tablet Take 5 mg by mouth daily.    [provider]  diclofenac Sodium (VOLTAREN) 1 % GEL Apply 4 g topically 4 (four) times daily. To affected joint. 06/27/20   Gregor Hams, MD  doxazosin (CARDURA) 2 MG tablet TAKE 1 TABLET(2 MG) BY MOUTH DAILY 07/09/20   Lelon Perla, MD  doxycycline (VIBRAMYCIN) 100 MG capsule Take 1 capsule (100 mg total) by mouth 2 (two)  times daily for 7 days. 10/17/20 10/24/20  Hall-Potvin, [place], PA-C  Lancets (ONETOUCH DELICA PLUS [id number]) MISC Apply 1 each topically 3 (three) times daily. 07/18/20   [provider]  loratadine (CLARITIN) 5 MG/5ML syrup Take 5 mLs (5 mg total) by mouth daily. 04/18/19   Wieters, Hallie C, PA-C  magnesium hydroxide (MILK OF MAGNESIA) 400 MG/5ML suspension Take 15 mLs by mouth daily as needed for mild constipation.    [provider]  Multiple Vitamins-Minerals (MULTIVITAMIN ADULTS) TABS Take 1 tablet by mouth daily.    [provider]  Propylene Glycol (SYSTANE BALANCE OP) Apply 1 drop to eye daily as needed (for dry eyes).    [provider]    Family History Family History  Problem Relation Age of Onset  . Diabetes Mellitus II Other   . Pancreatic cancer Mother   . Heart disease Father   . CAD Brother     Social History Social History   Tobacco Use  . Smoking status: Former Research scientist (life sciences)  . Smokeless tobacco: Never Used  Vaping Use  . Vaping Use: Never used  Substance Use Topics  . Alcohol use: No  . Drug use: No     Allergies   Wellbutrin [bupropion], Codeine, and Penicillins   Review of Systems Review of Systems  Constitutional: Negative for fatigue and fever.  HENT: Negative for ear pain, sinus pain, sore throat and voice change.   Eyes: Negative for pain, redness and visual disturbance.  Respiratory: Negative for cough and shortness of breath.   Cardiovascular: Negative for chest pain and palpitations.  Gastrointestinal: Negative for abdominal pain, diarrhea and vomiting.  Musculoskeletal: Positive for joint swelling. Negative for arthralgias and myalgias.  Skin: Negative for rash and wound.  Neurological: Negative for dizziness, syncope, facial asymmetry, speech difficulty, weakness, light-headedness, numbness and headaches.     Physical Exam Triage Vital Signs ED Triage Vitals  Enc Vitals Group     BP      Pulse       Resp      Temp      Temp src      SpO2      Weight      Height      Head Circumference      Peak Flow      Pain Score      Pain Loc      Pain Edu?      Excl. in [place]?    No data found.  Updated Vital Signs Pulse 80   Temp 98.3 F (36.8 C) (Oral)   Resp 20   SpO2 96%   Visual Acuity Right Eye Distance:   Left Eye Distance:   Bilateral Distance:    Right Eye Near:   Left Eye Near:    Bilateral Near:  Physical Exam Constitutional:      General: She is not in acute distress. HENT:     Head: Normocephalic and atraumatic.  Eyes:     General: No scleral icterus.    Pupils: Pupils are equal, round, and reactive to light.  Cardiovascular:     Rate and Rhythm: Normal rate.  Pulmonary:     Effort: Pulmonary effort is normal.  Musculoskeletal:        General: Normal range of motion.     Comments: Left middle finger with PIP joint pain, swelling, erythema.  No fluctuance, open wound or discharge.  Neurovascularly intact.  Skin:    General: Skin is warm.     Coloration: Skin is not jaundiced or pale.  Neurological:     Mental Status: She is alert and oriented to person, place, and time.      UC Treatments / Results  Labs (all labs ordered are listed, but only abnormal results are displayed) Labs Reviewed - No data to display  EKG   Radiology No results found.  Procedures Procedures (including critical care time)  Medications Ordered in UC Medications - No data to display  Initial Impression / Assessment and Plan / UC Course  I have reviewed the triage vital signs and the nursing notes.  Pertinent labs & imaging results that were available during my care of the patient were reviewed by me and considered in my medical decision making (see chart for details).     H&P concerning for gouty arthritis VS cellulitis.  Given comorbidities/risk, will start antibiotic.  More concerning however is patient's blood pressure: Denying signs/symptoms of endorgan  damage.  Declines EKG today.  States she will "go home and take my blood pressure medication and then check to see if it goes down".  Stressed importance of follow-up with PCP and/or going to ER should patient develop any signs or symptoms of endorgan damage given blood pressure readings.  Return precautions discussed, pt verbalized understanding and is agreeable to plan. Final Clinical Impressions(s) / UC Diagnoses   Final diagnoses:  Asymptomatic hypertensive urgency  Finger pain, left  Finger joint swelling, left     Discharge Instructions     Keep area(s) clean and dry. Take antibiotic as prescribed with food - important to complete course. Return for worsening pain, redness, swelling, discharge, fever.    ED Prescriptions    Medication Sig Dispense Auth. Provider   doxycycline (VIBRAMYCIN) 100 MG capsule Take 1 capsule (100 mg total) by mouth 2 (two) times daily for 7 days. 14 capsule Hall-Potvin, [place], PA-C     PDMP not reviewed this encounter.   Hall-Potvin, [place], [place] 10/17/20 1657

## 2020-10-17 NOTE — ED Triage Notes (Signed)
Pt states she woke Sunday and her left hand and index finger were painful and swollen. Pt has no idea how this occurred. Pt is aox4 and ambulatory.

## 2020-10-17 NOTE — Discharge Instructions (Addendum)
Keep area(s) clean and dry. Take antibiotic as prescribed with food - important to complete course. Return for worsening pain, redness, swelling, discharge, fever. 

## 2020-12-26 DIAGNOSIS — Z03818 Encounter for observation for suspected exposure to other biological agents ruled out: Secondary | ICD-10-CM | POA: Diagnosis not present

## 2020-12-27 NOTE — Progress Notes (Deleted)
   I, [name], LAT, ATC, am serving as scribe for Dr. [name].  Krista Rosales is a 87 y.o. female who presents to [place] [facility] today for f/u of chronic B knee pain.  She was last seen by Dr. Georgina Snell on 09/27/20 and had B knee injections.  Since her last visit, pt reports  Diagnostic testing: L knee XR- 03/30/20   Pertinent review of systems: ***  Relevant historical information: ***   Exam:  There were no vitals taken for this visit. General: Well Developed, well nourished, and in no acute distress.   MSK: ***    Lab and Radiology Results No results found for this or any previous visit (from the past 72 hour(s)). No results found.     Assessment and Plan: 87 y.o. female with ***   PDMP not reviewed this encounter. No orders of the defined types were placed in this encounter.  No orders of the defined types were placed in this encounter.    Discussed warning signs or symptoms. Please see discharge instructions. Patient expresses understanding.   ***

## 2020-12-28 ENCOUNTER — Ambulatory Visit: Payer: Medicare Other | Admitting: Family Medicine

## 2021-01-02 ENCOUNTER — Encounter (HOSPITAL_COMMUNITY): Payer: Self-pay

## 2021-01-02 ENCOUNTER — Emergency Department (HOSPITAL_COMMUNITY)
Admission: EM | Admit: 2021-01-02 | Discharge: 2021-01-03 | Disposition: A | Discharge status: Home / Self Care | Payer: Medicare HMO | Attending: Emergency Medicine | Admitting: Emergency Medicine

## 2021-01-02 ENCOUNTER — Other Ambulatory Visit: Payer: Self-pay

## 2021-01-02 ENCOUNTER — Ambulatory Visit (HOSPITAL_COMMUNITY)
Admission: EM | Admit: 2021-01-02 | Discharge: 2021-01-02 | Disposition: A | Discharge status: Home / Self Care | Payer: Medicare HMO

## 2021-01-02 DIAGNOSIS — E1159 Type 2 diabetes mellitus with other circulatory complications: Secondary | ICD-10-CM | POA: Insufficient documentation

## 2021-01-02 DIAGNOSIS — Z794 Long term (current) use of insulin: Secondary | ICD-10-CM | POA: Insufficient documentation

## 2021-01-02 DIAGNOSIS — R42 Dizziness and giddiness: Secondary | ICD-10-CM

## 2021-01-02 DIAGNOSIS — Z87891 Personal history of nicotine dependence: Secondary | ICD-10-CM | POA: Insufficient documentation

## 2021-01-02 DIAGNOSIS — I160 Hypertensive urgency: Secondary | ICD-10-CM

## 2021-01-02 DIAGNOSIS — I5032 Chronic diastolic (congestive) heart failure: Secondary | ICD-10-CM | POA: Diagnosis not present

## 2021-01-02 DIAGNOSIS — R29818 Other symptoms and signs involving the nervous system: Secondary | ICD-10-CM

## 2021-01-02 DIAGNOSIS — I10 Essential (primary) hypertension: Secondary | ICD-10-CM | POA: Diagnosis not present

## 2021-01-02 DIAGNOSIS — Z79899 Other long term (current) drug therapy: Secondary | ICD-10-CM | POA: Diagnosis not present

## 2021-01-02 DIAGNOSIS — I110 Hypertensive heart disease with heart failure: Secondary | ICD-10-CM | POA: Diagnosis not present

## 2021-01-02 DIAGNOSIS — R519 Headache, unspecified: Secondary | ICD-10-CM | POA: Diagnosis not present

## 2021-01-02 LAB — CBC
HCT: 37.1 % (ref 36.0–46.0)
Hemoglobin: 11.6 g/dL — ABNORMAL LOW (ref 12.0–15.0)
MCH: 27.8 pg (ref 26.0–34.0)
MCHC: 31.3 g/dL (ref 30.0–36.0)
MCV: 89 fL (ref 80.0–100.0)
Platelets: 359 10*3/uL (ref 150–400)
RBC: 4.17 MIL/uL (ref 3.87–5.11)
RDW: 14.9 % (ref 11.5–15.5)
WBC: 6.2 10*3/uL (ref 4.0–10.5)
nRBC: 0 % (ref 0.0–0.2)

## 2021-01-02 LAB — BASIC METABOLIC PANEL
Anion gap: 12 (ref 5–15)
BUN: 22 mg/dL (ref 8–23)
CO2: 22 mmol/L (ref 22–32)
Calcium: 8.9 mg/dL (ref 8.9–10.3)
Chloride: 101 mmol/L (ref 98–111)
Creatinine, Ser: 1.57 mg/dL — ABNORMAL HIGH (ref 0.44–1.00)
GFR, Estimated: 32 mL/min — ABNORMAL LOW (ref >60)
Glucose, Bld: 133 mg/dL — ABNORMAL HIGH (ref 70–99)
Potassium: 3.7 mmol/L (ref 3.5–5.1)
Sodium: 135 mmol/L (ref 135–145)

## 2021-01-02 NOTE — ED Provider Notes (Signed)
[place]   MRN: [id number] DOB: [birth year]  Subjective:   Krista Rosales is a 87 y.o. female presenting for 1 week history of persistent left-sided temporal frontal headache, dizziness and feeling unsteady on her feet.  Patient states that she has been checking her blood pressure and has been in the 381O systolic.  She states that normally her blood pressure is not that high.  She is compliant with her medications.  Denies any active confusion, vision change, chest pain, belly pain, hematuria, falls.  No current facility-administered medications for this encounter.  Current Outpatient Medications:  .  acetaminophen (TYLENOL) 500 MG tablet, Take 500 mg by mouth every 6 (six) hours as needed for moderate pain. , Disp: , Rfl:  .  ALPRAZolam (XANAX) 0.5 MG tablet, Take 0.25 mg by mouth 2 (two) times daily. , Disp: , Rfl: 0 .  amLODipine (NORVASC) 10 MG tablet, Take 10 mg by mouth daily., Disp: , Rfl:  .  amLODipine (NORVASC) 5 MG tablet, Take 5 mg by mouth daily., Disp: , Rfl:  .  cholecalciferol (VITAMIN D) 1000 UNITS tablet, Take 2,000 Units by mouth daily. , Disp: , Rfl:  .  diclofenac Sodium (VOLTAREN) 1 % GEL, Apply 4 g topically 4 (four) times daily. To affected joint., Disp: 400 g, Rfl: 11 .  doxazosin (CARDURA) 2 MG tablet, TAKE 1 TABLET(2 MG) BY MOUTH DAILY, Disp: 90 tablet, Rfl: 3 .  furosemide (LASIX) 20 MG tablet, One tablet once daily as needed for swelling (Patient taking differently: Take 20 mg by mouth See admin instructions. Takes 1 tablet Tuesday,Thursday, Saturday and Sunday and 2 tablets on Monday, Wednesday and Friday.), Disp: 30 tablet, Rfl: 4 .  hydrALAZINE (APRESOLINE) 100 MG tablet, Take 1 tablet (100 mg total) by mouth 3 (three) times daily., Disp: 30 tablet, Rfl: 0 .  Lancets (ONETOUCH DELICA PLUS [id number]) MISC, Apply 1 each topically 3 (three) times daily., Disp: , Rfl:  .  loratadine (CLARITIN) 5 MG/5ML syrup, Take 5 mLs (5 mg total) by mouth  daily., Disp: 60 mL, Rfl: 0 .  magnesium hydroxide (MILK OF MAGNESIA) 400 MG/5ML suspension, Take 15 mLs by mouth daily as needed for mild constipation., Disp: , Rfl:  .  Multiple Vitamins-Minerals (MULTIVITAMIN ADULTS) TABS, Take 1 tablet by mouth daily., Disp: , Rfl:  .  ONETOUCH VERIO test strip, 1 each 3 (three) times daily., Disp: , Rfl:  .  Propylene Glycol (SYSTANE BALANCE OP), Apply 1 drop to eye daily as needed (for dry eyes)., Disp: , Rfl:  .  vitamin B-12 (CYANOCOBALAMIN) 1000 MCG tablet, Take 1,000 mcg by mouth daily., Disp: , Rfl:    Allergies  Allergen Reactions  . Wellbutrin [Bupropion]     unknown  . Codeine     hallucinate  . Penicillins Rash    Did it involve swelling of the face/tongue/throat, SOB, or low BP? Y Did it involve sudden or severe rash/hives, skin peeling, or any reaction on the inside of your mouth or nose? Y Did you need to seek medical attention at a hospital or doctor's office? Y When did it last happen?While at hospital If all above answers are "NO", may proceed with cephalosporin use.     Past Medical History:  Diagnosis Date  . ANEMIA, IRON DEFICIENCY 05/07/2009   Qualifier: Diagnosis of  By: Loanne Drilling MD, Jacelyn Pi   . ARTHRITIS 05/30/2008   Qualifier: Diagnosis of  By: Marland Mcalpine    . DIABETES  MELLITUS, TYPE II 07/16/2007   Qualifier: Diagnosis of  By: Marca Ancona RMA, Lucy    . Diastolic congestive heart failure ([place]) 09/2017  . DIVERTICULOSIS, COLON 05/30/2008   Qualifier: Diagnosis of  By: Marland Mcalpine    . Dyspnea   . Glaucoma   . HYPERLIPIDEMIA 07/16/2007   Qualifier: Diagnosis of  By: Reatha Armour, Lucy    . Hypertension   . Normocytic anemia      Past Surgical History:  Procedure Laterality Date  . ABDOMINAL HYSTERECTOMY      Family History  Problem Relation Age of Onset  . Diabetes Mellitus II Other   . Pancreatic cancer Mother   . Heart disease Father   . CAD Brother     Social History   Tobacco Use  . Smoking  status: Former Research scientist (life sciences)  . Smokeless tobacco: Never Used  Vaping Use  . Vaping Use: Never used  Substance Use Topics  . Alcohol use: No  . Drug use: No    ROS   Objective:   Vitals: BP (!) 209/91   Pulse 83   Resp 19   SpO2 100%   BP Readings from Last 3 Encounters:  09/27/20 (!) 150/70  08/13/20 130/70  06/27/20 138/62   Physical Exam Constitutional:      General: She is not in acute distress.    Appearance: Normal appearance. She is well-developed. She is not ill-appearing, toxic-appearing or diaphoretic.  HENT:     Head: Normocephalic and atraumatic.     Nose: Nose normal.     Mouth/Throat:     Mouth: Mucous membranes are moist.  Eyes:     Extraocular Movements: Extraocular movements intact.     Pupils: Pupils are equal, round, and reactive to light.  Cardiovascular:     Rate and Rhythm: Normal rate and regular rhythm.     Pulses: Normal pulses.     Heart sounds: Normal heart sounds. No murmur heard. No friction rub. No gallop.   Pulmonary:     Effort: Pulmonary effort is normal. No respiratory distress.     Breath sounds: Normal breath sounds. No stridor. No wheezing, rhonchi or rales.  Skin:    General: Skin is warm and dry.     Findings: No rash.  Neurological:     Mental Status: She is alert and oriented to person, place, and time.     Cranial Nerves: No facial asymmetry.     Motor: No weakness or pronator drift.     Coordination: Romberg sign positive. Coordination abnormal.  Psychiatric:        Mood and Affect: Mood normal.        Behavior: Behavior normal.        Thought Content: Thought content normal.     Assessment and Plan :   PDMP not reviewed this encounter.  1. Left-sided headache   2. Dizziness   3. Positive Romberg test   4. Hypertensive urgency     Patient has hypertensive emergency given her positive Romberg and new onset wide gait.  Recommended she present to the emergency room by having her family drive her there now.  Concern  is for acute intracranial process, stroke. Counseled patient on potential for adverse effects with medications prescribed/recommended today, ER and return-to-clinic precautions discussed, patient verbalized understanding.    Jaynee Eagles, [place] 01/02/21 1858

## 2021-01-02 NOTE — ED Notes (Signed)
Called for triage no answer x2

## 2021-01-02 NOTE — Discharge Instructions (Signed)
Please have your family member drive you to the emergency room now as you will need imaging and blood work to rule out a stroke.

## 2021-01-02 NOTE — ED Triage Notes (Addendum)
Pt in with c/o HTN. States her BP was 206/75 then 174/85 Also c/o slight headache and feels like her "eyes are dim"  States that it was not high yesterday when she checked it, and she has been taking her BP meds daily

## 2021-01-02 NOTE — ED Provider Notes (Signed)
[facility] Provider Note   CSN: [id number] Arrival date & time: 01/02/21  1916     History Chief Complaint  Patient presents with  . Dizziness    Krista Rosales is a 87 y.o. female.  The history is provided by the patient.  Dizziness Quality:  Vertigo Severity:  Moderate Onset quality:  Gradual Duration: "at least a week" Timing:  Intermittent Progression:  Worsening Chronicity:  New Relieved by:  Nothing Worsened by:  Movement and standing up Associated symptoms: headaches   Associated symptoms: no chest pain, no hearing loss, no vision changes and no vomiting   Associated symptoms comment:  "legs are weak"   Patient reports she has been under a lot of stress recently due to loss of family member She reports recent increase in her blood pressured and left sided HA She also reports dizziness upon standing/walking Also reports vague "legs are weak" No cp/sob No abd pain No new visual changes     Past Medical History:  Diagnosis Date  . ANEMIA, IRON DEFICIENCY 05/07/2009   Qualifier: Diagnosis of  By: Loanne Drilling MD, Jacelyn Pi   . ARTHRITIS 05/30/2008   Qualifier: Diagnosis of  By: Marland Mcalpine    . DIABETES MELLITUS, TYPE II 07/16/2007   Qualifier: Diagnosis of  By: Marca Ancona RMA, Lucy    . Diastolic congestive heart failure ([place]) 09/2017  . DIVERTICULOSIS, COLON 05/30/2008   Qualifier: Diagnosis of  By: Marland Mcalpine    . Dyspnea   . Glaucoma   . HYPERLIPIDEMIA 07/16/2007   Qualifier: Diagnosis of  By: Reatha Armour, Lucy    . Hypertension   . Normocytic anemia     Patient Active Problem List   Diagnosis Date Noted  . Chest pain 11/20/2017  . Diastolic congestive heart failure ([place]) 09/30/2017  . Headache   . Hypertensive urgency   . Hypokalemia   . Bradycardia   . Normocytic anemia   . Chronic diastolic CHF (congestive heart failure) ([place])   . Other emphysema ([place])   . Glaucoma   . Hypertension 02/20/2015  .  Malignant hypertension 02/20/2015  . ANEMIA, IRON DEFICIENCY 05/07/2009  . DYSPNEA 05/07/2009  . CONSTIPATION 06/01/2008  . HEMORRHOIDS 05/30/2008  . DIVERTICULOSIS, COLON 05/30/2008  . ARTHRITIS 05/30/2008  . COUGH 02/03/2008  . Diabetes mellitus type 2 with complications ([place]) 81/82/9937  . HYPERLIPIDEMIA 07/16/2007  . Essential hypertension 07/16/2007  . Coronary atherosclerosis 07/16/2007  . ALLERGIC RHINITIS 07/16/2007  . OSTEOPOROSIS 07/16/2007    Past Surgical History:  Procedure Laterality Date  . ABDOMINAL HYSTERECTOMY       OB History   No obstetric history on file.     Family History  Problem Relation Age of Onset  . Diabetes Mellitus II Other   . Pancreatic cancer Mother   . Heart disease Father   . CAD Brother     Social History   Tobacco Use  . Smoking status: Former Research scientist (life sciences)  . Smokeless tobacco: Never Used  Vaping Use  . Vaping Use: Never used  Substance Use Topics  . Alcohol use: No  . Drug use: No    Home Medications Prior to Admission medications   Medication Sig Start Date End Date Taking? Authorizing Provider  acetaminophen (TYLENOL) 500 MG tablet Take 500 mg by mouth every 6 (six) hours as needed for moderate pain.     [provider]  ALPRAZolam Duanne Moron) 0.5 MG tablet Take 0.25 mg by mouth 2 (two)  times daily.  09/07/17   [provider]  amLODipine (NORVASC) 10 MG tablet Take 10 mg by mouth daily.    [provider]  amLODipine (NORVASC) 5 MG tablet Take 5 mg by mouth daily.    [provider]  cholecalciferol (VITAMIN D) 1000 UNITS tablet Take 2,000 Units by mouth daily.     [provider]  Cholecalciferol (VITAMIN D3) 100000 UNIT/GM POWD 2 tablets    [provider]  diclofenac Sodium (VOLTAREN) 1 % GEL Apply 4 g topically 4 (four) times daily. To affected joint. 06/27/20   Gregor Hams, MD  doxazosin (CARDURA) 2 MG tablet TAKE 1 TABLET(2 MG) BY MOUTH DAILY 07/09/20   Lelon Perla,  MD  furosemide (LASIX) 20 MG tablet One tablet once daily as needed for swelling Patient taking differently: Take 20 mg by mouth See admin instructions. Takes 1 tablet Tuesday,Thursday, Saturday and Sunday and 2 tablets on Monday, Wednesday and Friday. 11/15/18   Lelon Perla, MD  hydrALAZINE (APRESOLINE) 100 MG tablet Take 1 tablet (100 mg total) by mouth 3 (three) times daily. 04/17/20   Isla Pence, MD  Lancets [facility] DELICA PLUS [id number]) MISC Apply 1 each topically 3 (three) times daily. 07/18/20   [provider]  loratadine (CLARITIN) 5 MG/5ML syrup Take 5 mLs (5 mg total) by mouth daily. 04/18/19   Wieters, Hallie C, PA-C  magnesium hydroxide (MILK OF MAGNESIA) 400 MG/5ML suspension Take 15 mLs by mouth daily as needed for mild constipation.    [provider]  Multiple Vitamins-Minerals (MULTIVITAMIN ADULTS) TABS Take 1 tablet by mouth daily.    [provider]  omeprazole (PRILOSEC) 40 MG capsule Take 40 mg by mouth daily. 09/12/20   [provider]  [facility] VERIO test strip 1 each 3 (three) times daily. 07/17/20   [provider]  Propylene Glycol (SYSTANE BALANCE OP) Apply 1 drop to eye daily as needed (for dry eyes).    [provider]  vitamin B-12 (CYANOCOBALAMIN) 1000 MCG tablet Take 1,000 mcg by mouth daily.    [provider]    Allergies    Wellbutrin [bupropion], Codeine, and Penicillins  Review of Systems   Review of Systems  Constitutional: Negative for fever.  HENT: Negative for hearing loss and trouble swallowing.   Cardiovascular: Negative for chest pain.  Gastrointestinal: Negative for vomiting.  Neurological: Positive for dizziness and headaches. Negative for speech difficulty.  All other systems reviewed and are negative.   Physical Exam Updated Vital Signs BP (!) 244/122 (BP Location: Left Arm)   Pulse 79   Temp 98.5 F (36.9 C) (Oral)   Resp 16   SpO2 97%   Physical Exam    CONSTITUTIONAL: elderly, no distress HEAD: Normocephalic/atraumatic, no temporal tenderness EYES: EOMI/PERRL, no nystagmus,no ptosis, no corneal hazing ENMT: Mucous membranes moist NECK: supple no meningeal signs, no bruits CV: S1/S2 noted, no murmurs/rubs/gallops noted LUNGS: Lungs are clear to auscultation bilaterally, no apparent distress ABDOMEN: soft, nontender, no rebound or guarding GU:no cva tenderness NEURO:Awake/alert, face symmetric, no arm or leg drift is noted Equal 5/5 strength with shoulder abduction, elbow flex/extension, wrist flex/extension in upper extremities  Equal 5/5 strength with hip flexion,knee flex/extension, foot dorsi/plantar flexion Cranial nerves 3/4/5/6/06/29/09/11/12 tested and intact Gait is slow without ataxia Sensation to light touch intact in all extremities EXTREMITIES: pulses normal, full ROM, no deformities to the feet SKIN: warm, color normal PSYCH: no abnormalities of mood noted   ED Results /  Procedures / Treatments   Labs (all labs ordered are listed, but only abnormal results are displayed) Labs Reviewed  BASIC METABOLIC PANEL - Abnormal; Notable for the following components:      Result Value   Glucose, Bld 133 (*)    Creatinine, Ser 1.57 (*)    GFR, Estimated 32 (*)    All other components within normal limits  CBC - Abnormal; Notable for the following components:   Hemoglobin 11.6 (*)    All other components within normal limits  URINALYSIS, ROUTINE W REFLEX MICROSCOPIC - Abnormal; Notable for the following components:   Color, Urine STRAW (*)    All other components within normal limits  SEDIMENTATION RATE - Abnormal; Notable for the following components:   Sed Rate 53 (*)    All other components within normal limits  CBG MONITORING, ED - Abnormal; Notable for the following components:   Glucose-Capillary 128 (*)    All other components within normal limits  ETHANOL  PROTIME-INR  APTT  RAPID URINE DRUG SCREEN, HOSP  PERFORMED    EKG EKG Interpretation  Date/Time:  Thursday January 03 2021 01:01:28 EST Ventricular Rate:  72 PR Interval:    QRS Duration: 104 QT Interval:  443 QTC Calculation: 485 R Axis:   -57 Text Interpretation: Sinus rhythm Atrial premature complex Short PR interval Left anterior fascicular block Anterior infarct, old Confirmed by Ripley Fraise [phone]) on 01/03/2021 1:21:02 AM   Radiology CT HEAD WO CONTRAST  Result Date: 01/03/2021 CLINICAL DATA:  Dizziness EXAM: CT HEAD WITHOUT CONTRAST TECHNIQUE: Contiguous axial images were obtained from the base of the skull through the vertex without intravenous contrast. COMPARISON:  02/20/2015 FINDINGS: Brain: There is atrophy and chronic small vessel disease changes. No acute intracranial abnormality. Specifically, no hemorrhage, hydrocephalus, mass lesion, acute infarction, or significant intracranial injury. Vascular: No hyperdense vessel or unexpected calcification. Skull: No acute calvarial abnormality. Sinuses/Orbits: No acute findings Other: None IMPRESSION: Atrophy, chronic microvascular disease. No acute intracranial abnormality. Electronically Signed   By: Rolm Baptise M.D.   On: 01/03/2021 01:23    Procedures Procedures    Medications Ordered in ED Medications - No data to display  ED Course  I have reviewed the triage vital signs and the nursing notes.  Pertinent labs & imaging results that were available during my care of the patient were reviewed by me and considered in my medical decision making (see chart for details).    MDM Rules/Calculators/A&P                          11:55 PM Pt sent from urgent care for concern for stroke/intracranial process.  On my exam no focal neuro deficits Will start w/ CT head 2:06 AM Most of work-up at this time is unremarkable.  CT head negative.  She is ambulating independently without difficulty. EKG was abnormal, but will review prior EKGs, it appears similar.  She has been  seen by cardiology before and has been noted to have a Mobitz type I at times as well as sinus pauses and sinus tachycardia and PACs. This does not appear to be an acute issue. 2:53 AM Extensive work-up was unremarkable.  Patient eating a [place] sandwich without difficulty. She denies any weakness or dizziness.  She reports feeling fine when she ambulated. Telemetry monitoring does not reveal any acute abnormalities or worrisome rhythm Patient feels comfortable for discharge home. Low suspicion for occult stroke or other acute neurologic emergency.  Hypertension is slowly improving but she will also need to take her daily medications. Final Clinical Impression(s) / ED Diagnoses Final diagnoses:  Dizziness  Primary hypertension    Rx / DC Orders ED Discharge Orders    None       Ripley Fraise, MD 01/03/21 [phone]

## 2021-01-02 NOTE — ED Triage Notes (Signed)
Pt located in the lobby, states that when she stands she feels dizzy for the past 5-6 days.

## 2021-01-03 ENCOUNTER — Ambulatory Visit: Payer: Medicare Other | Admitting: Family Medicine

## 2021-01-03 ENCOUNTER — Emergency Department (HOSPITAL_COMMUNITY): Payer: Medicare HMO

## 2021-01-03 DIAGNOSIS — R42 Dizziness and giddiness: Secondary | ICD-10-CM | POA: Diagnosis not present

## 2021-01-03 LAB — CBG MONITORING, ED: Glucose-Capillary: 128 mg/dL — ABNORMAL HIGH (ref 70–99)

## 2021-01-03 LAB — URINALYSIS, ROUTINE W REFLEX MICROSCOPIC
Bilirubin Urine: NEGATIVE
Glucose, UA: NEGATIVE mg/dL
Hgb urine dipstick: NEGATIVE
Ketones, ur: NEGATIVE mg/dL
Leukocytes,Ua: NEGATIVE
Nitrite: NEGATIVE
Protein, ur: NEGATIVE mg/dL
Specific Gravity, Urine: 1.008 (ref 1.005–1.030)
pH: 5 (ref 5.0–8.0)

## 2021-01-03 LAB — RAPID URINE DRUG SCREEN, HOSP PERFORMED
Amphetamines: NOT DETECTED
Barbiturates: NOT DETECTED
Benzodiazepines: NOT DETECTED
Cocaine: NOT DETECTED
Opiates: NOT DETECTED
Tetrahydrocannabinol: NOT DETECTED

## 2021-01-03 LAB — SEDIMENTATION RATE: Sed Rate: 53 mm/hr — ABNORMAL HIGH (ref 0–22)

## 2021-01-03 LAB — PROTIME-INR
INR: 1.1 (ref 0.8–1.2)
Prothrombin Time: 13.5 seconds (ref 11.4–15.2)

## 2021-01-03 LAB — APTT: aPTT: 31 seconds (ref 24–36)

## 2021-01-03 LAB — ETHANOL: Alcohol, Ethyl (B): <10 mg/dL (ref <10)

## 2021-01-03 NOTE — Discharge Instructions (Addendum)

## 2021-01-03 NOTE — ED Notes (Signed)
Pt given a [place] sandwich and a cup of water.

## 2021-01-03 NOTE — ED Notes (Signed)
Pt ambulated to restroom. 

## 2021-01-07 DIAGNOSIS — Z79899 Other long term (current) drug therapy: Secondary | ICD-10-CM | POA: Diagnosis not present

## 2021-01-07 DIAGNOSIS — E1142 Type 2 diabetes mellitus with diabetic polyneuropathy: Secondary | ICD-10-CM | POA: Diagnosis not present

## 2021-01-07 DIAGNOSIS — E559 Vitamin D deficiency, unspecified: Secondary | ICD-10-CM | POA: Diagnosis not present

## 2021-01-07 DIAGNOSIS — J449 Chronic obstructive pulmonary disease, unspecified: Secondary | ICD-10-CM | POA: Diagnosis not present

## 2021-01-07 DIAGNOSIS — E785 Hyperlipidemia, unspecified: Secondary | ICD-10-CM | POA: Diagnosis not present

## 2021-01-07 DIAGNOSIS — I509 Heart failure, unspecified: Secondary | ICD-10-CM | POA: Diagnosis not present

## 2021-01-07 DIAGNOSIS — I10 Essential (primary) hypertension: Secondary | ICD-10-CM | POA: Diagnosis not present

## 2021-01-07 DIAGNOSIS — R634 Abnormal weight loss: Secondary | ICD-10-CM | POA: Diagnosis not present

## 2021-01-07 DIAGNOSIS — F411 Generalized anxiety disorder: Secondary | ICD-10-CM | POA: Diagnosis not present

## 2021-01-10 ENCOUNTER — Ambulatory Visit: Payer: Medicare Other | Admitting: Family Medicine

## 2021-01-17 NOTE — Progress Notes (Signed)
I, Krista Rosales, LAT, ATC acting as a scribe for Krista Leader, MD.  Krista Rosales is a 87 y.o. female who presents to [place] at [facility] today for chronic L knee pain. Pt completed a prior Gelsyn injection series on 05/16/20. Pt was last seen by Dr. Georgina Snell on 09/27/20. Today, pt reports bilat knee pain, L>R. Pt locates pain to anterior aspect and c/o leg weakness. Pt reports pain radiates down anterior shin and c/o legs feeling hot. Pt also c/o L 3rd finger. Pt reports swelling around PIP joint. Swelling has been ongoing since Saturday.  Dx imaging: 03/30/20 L knee XR  Pertinent review of systems: No fever or chills  Relevant historical information: HTN, CHF   Exam:  BP 136/88 (BP Location: Right Arm, Patient Position: Sitting, Cuff Size: Normal)   Pulse 71   Ht 5\' 7"  (1.702 m)   Wt 147 lb 12.8 oz (67 kg)   SpO2 95%   BMI 23.15 kg/m  General: Well Developed, well nourished, and in no acute distress.   MSK: left 3rd PIP with effusion mild. No erythremia.  Normal extension. Lacks full flexion.  Normal strength.   Knees bilaterally mild effusion without erythema.  Normal motion with crepitation.    Lab and Radiology Results  Procedure: Real-time Ultrasound Guided Injection of right knee superior lateral patellar space Device: Philips Affiniti 50G Images permanently stored and available for review in PACS Verbal informed consent obtained.  Discussed risks and benefits of procedure. Warned about infection bleeding damage to structures skin hypopigmentation and fat atrophy among others. Patient expresses understanding and agreement Time-out conducted.   Noted no overlying erythema, induration, or other signs of local infection.   Skin prepped in a sterile fashion.   Local anesthesia: Topical Ethyl chloride.   With sterile technique and under real time ultrasound guidance:  40 mg of Kenalog and 2 mL of Marcaine injected into knee joint. Fluid seen  entering the joint capsule.   Completed without difficulty   Pain immediately resolved suggesting accurate placement of the medication.   Advised to call if fevers/chills, erythema, induration, drainage, or persistent bleeding.   Images permanently stored and available for review in the ultrasound unit.  Impression: Technically successful ultrasound guided injection.  Procedure: Real-time Ultrasound Guided Injection of left knee superior lateral patellar space Device: Philips Affiniti 50G Images permanently stored and available for review in PACS Verbal informed consent obtained.  Discussed risks and benefits of procedure. Warned about infection bleeding damage to structures skin hypopigmentation and fat atrophy among others. Patient expresses understanding and agreement Time-out conducted.   Noted no overlying erythema, induration, or other signs of local infection.   Skin prepped in a sterile fashion.   Local anesthesia: Topical Ethyl chloride.   With sterile technique and under real time ultrasound guidance:  40 mg of Kenalog and 2 mL of Marcaine injected into knee joint. Fluid seen entering the joint capsule.   Completed without difficulty   Pain immediately resolved suggesting accurate placement of the medication.   Advised to call if fevers/chills, erythema, induration, drainage, or persistent bleeding.   Images permanently stored and available for review in the ultrasound unit.  Impression: Technically successful ultrasound guided injection.   Diagnostic Limited MSK Ultrasound of: Left third digit PIP Degenerative changes present at j bony structures with irregular joint surfaces and bone spurs.  Moderate joint effusion present. Impression: DJD with effusion.    Assessment and Plan: 87 y.o. female with bilateral knee  pain due to DJD.  Proceeding with steroid injections about every 3 months.  This seems to be working quite well and will continue this for now.  Patient with new  onset left third digit swelling and pain.  Seems to be waxing and waning over the past few months.  Clinically no sign of infection at this time.  She will probably get some benefit from the steroid injection in her knees systemically today which likely will help her finger.  Recheck in about a week if not improved.  That time we will likely proceed with steroid injection in the finger.  Recommend Voltaren gel.   PDMP not reviewed this encounter. Orders Placed This Encounter  Procedures  . [place] LIMITED JOINT SPACE STRUCTURES LOW BILAT(NO LINKED CHARGES)    Standing Status:   Future    Number of Occurrences:   1    Standing Expiration Date:   07/18/2021    Order Specific Question:   Reason for Exam (SYMPTOM  OR DIAGNOSIS REQUIRED)    Answer:   chronic bilat knee pain    Order Specific Question:   Preferred imaging location?    Answer:   [place]   No orders of the defined types were placed in this encounter.    Discussed warning signs or symptoms. Please see discharge instructions. Patient expresses understanding.   The above documentation has been reviewed and is accurate and complete Krista Rosales, M.D.

## 2021-01-18 ENCOUNTER — Ambulatory Visit (INDEPENDENT_AMBULATORY_CARE_PROVIDER_SITE_OTHER): Payer: Medicare HMO | Admitting: Family Medicine

## 2021-01-18 ENCOUNTER — Ambulatory Visit: Payer: Self-pay

## 2021-01-18 ENCOUNTER — Other Ambulatory Visit: Payer: Self-pay

## 2021-01-18 VITALS — BP 136/88 | HR 71 | Ht 67.0 in | Wt 147.8 lb

## 2021-01-18 DIAGNOSIS — G8929 Other chronic pain: Secondary | ICD-10-CM | POA: Diagnosis not present

## 2021-01-18 DIAGNOSIS — M25561 Pain in right knee: Secondary | ICD-10-CM

## 2021-01-18 DIAGNOSIS — M25562 Pain in left knee: Secondary | ICD-10-CM

## 2021-01-18 DIAGNOSIS — M79645 Pain in left finger(s): Secondary | ICD-10-CM | POA: Diagnosis not present

## 2021-01-18 NOTE — Patient Instructions (Signed)
Thank you for coming in today.  Call or go to the ER if you develop a large red swollen joint with extreme pain or oozing puss.   Please use voltaren gel up to 4x daily for pain as needed. Use on the finger.   Recheck in about 1 week for the finger if the pain and swelling does not get better.

## 2021-01-24 NOTE — Progress Notes (Signed)
   I, [name], LAT, ATC, am serving as scribe for Dr. [name].  Krista Rosales is a 87 y.o. female who presents to [place] at [facility] today for f/u of L 3rd finger pain and swelling.  She was last seen by Dr. Georgina Snell on 01/18/21 for chronic B knee pain and L 3rd finger pain and had B knee injections.  Since her last visit, pt reports L 3rd finger is still swollen and painful. Able to flex some, but not fully.    Pertinent review of systems: No fevers or chills  Relevant historical information: Hypertension   Exam:  BP 138/88 (BP Location: Right Arm, Patient Position: Sitting, Cuff Size: Normal)   Pulse 79   Ht 5\' 7"  (1.702 m)   Wt 142 lb (64.4 kg)   SpO2 96%   BMI 22.24 kg/m  General: Well Developed, well nourished, and in no acute distress.   MSK: Left hand swollen third PIP.  Mildly tender palpation.  Lacks full flexion.  No triggering present.  Intact strength.    Lab and Radiology Results  Procedure: Real-time Ultrasound Guided Injection of left third PIP radial aspect Device: Philips Affiniti 50G Images permanently stored and available for review in PACS Verbal informed consent obtained.  Discussed risks and benefits of procedure. Warned about infection bleeding damage to structures skin hypopigmentation and fat atrophy among others. Patient expresses understanding and agreement Time-out conducted.   Noted no overlying erythema, induration, or other signs of local infection.   Skin prepped in a sterile fashion.   Local anesthesia: Topical Ethyl chloride.   With sterile technique and under real time ultrasound guidance:  30 mg of Depo-Medrol and 0.5 milliliters of lidocaine (total volume of 0.75 mL of 80 mg/ml Depo-Medrol solution mixed one-to-one with lidocaine) injected into PIP. Fluid seen entering the calcium.   Completed without difficulty   Pain immediately resolved suggesting accurate placement of the medication.   Advised to call if  fevers/chills, erythema, induration, drainage, or persistent bleeding.   Images permanently stored and available for review in the ultrasound unit.  Impression: Technically successful ultrasound guided injection.      Assessment and Plan: 87 y.o. female with left third PIP pain and swelling.  Fundamentally due to degenerative joint disease.  Plan to treat with injection after feeling typical trial of conservative management.  Recheck back with me as needed.   PDMP not reviewed this encounter. Orders Placed This Encounter  Procedures  . [place] LIMITED JOINT SPACE STRUCTURES UP BILAT(NO LINKED CHARGES)    Standing Status:   Future    Number of Occurrences:   1    Standing Expiration Date:   07/25/2021    Order Specific Question:   Reason for Exam (SYMPTOM  OR DIAGNOSIS REQUIRED)    Answer:   chronic finger pain    Order Specific Question:   Preferred imaging location?    Answer:   [place]   No orders of the defined types were placed in this encounter.    Discussed warning signs or symptoms. Please see discharge instructions. Patient expresses understanding.   The above documentation has been reviewed and is accurate and complete [name], M.D.

## 2021-01-25 ENCOUNTER — Ambulatory Visit: Payer: Self-pay

## 2021-01-25 ENCOUNTER — Other Ambulatory Visit: Payer: Self-pay

## 2021-01-25 ENCOUNTER — Encounter: Payer: Self-pay | Admitting: Family Medicine

## 2021-01-25 ENCOUNTER — Ambulatory Visit (INDEPENDENT_AMBULATORY_CARE_PROVIDER_SITE_OTHER): Payer: Medicare HMO | Admitting: Family Medicine

## 2021-01-25 VITALS — BP 138/88 | HR 79 | Ht 67.0 in | Wt 142.0 lb

## 2021-01-25 DIAGNOSIS — M79645 Pain in left finger(s): Secondary | ICD-10-CM | POA: Diagnosis not present

## 2021-01-25 NOTE — Patient Instructions (Signed)
Thank you for coming in today.  Call or go to the ER if you develop a large red swollen joint with extreme pain or oozing puss.   Recheck with me as needed.

## 2021-01-29 DIAGNOSIS — M722 Plantar fascial fibromatosis: Secondary | ICD-10-CM | POA: Diagnosis not present

## 2021-01-29 DIAGNOSIS — M71571 Other bursitis, not elsewhere classified, right ankle and foot: Secondary | ICD-10-CM | POA: Diagnosis not present

## 2021-01-29 DIAGNOSIS — M7752 Other enthesopathy of left foot: Secondary | ICD-10-CM | POA: Diagnosis not present

## 2021-01-29 DIAGNOSIS — M7751 Other enthesopathy of right foot: Secondary | ICD-10-CM | POA: Diagnosis not present

## 2021-01-29 DIAGNOSIS — M71572 Other bursitis, not elsewhere classified, left ankle and foot: Secondary | ICD-10-CM | POA: Diagnosis not present

## 2021-02-04 NOTE — Progress Notes (Signed)
HPI: FUbradycardia. Nuclear study January 2019 showed ejection fraction 46% and no ischemia or infarction. Ejection fraction felt betterthancalculated number. Echocardiogram inJanuary 2020 showed normal LV function, moderate left ventricular hypertrophy, mild diastolic dysfunction,mild mitral regurgitation.Monitor August 2020 showed sinus bradycardia with first-degree AV block, normal sinus rhythm, sinus tachycardia, intermittent Mobitz 1 second-degree AV block, multiple pauses with longest being 3.4 seconds, occasional PAC and PVC. Clonidine patch weanedto off. Since last seenshe denies dyspnea, chest pain, palpitations or syncope.  Current Outpatient Medications  Medication Sig Dispense Refill  . acetaminophen (TYLENOL) 500 MG tablet Take 500 mg by mouth every 6 (six) hours as needed for moderate pain.     Marland Kitchen ALPRAZolam (XANAX) 0.5 MG tablet Take 0.25 mg by mouth 2 (two) times daily.  0  . amLODipine (NORVASC) 10 MG tablet Take 10 mg by mouth daily.    Marland Kitchen amLODipine (NORVASC) 5 MG tablet Take 5 mg by mouth daily.    . cholecalciferol (VITAMIN D) 1000 UNITS tablet Take 2,000 Units by mouth daily.     . Cholecalciferol (VITAMIN D3) 100000 UNIT/GM POWD 2 tablets    . diclofenac Sodium (VOLTAREN) 1 % GEL Apply 4 g topically 4 (four) times daily. To affected joint. 400 g 11  . doxazosin (CARDURA) 2 MG tablet TAKE 1 TABLET(2 MG) BY MOUTH DAILY 90 tablet 3  . furosemide (LASIX) 20 MG tablet One tablet once daily as needed for swelling (Patient taking differently: Take 20 mg by mouth See admin instructions. Takes 1 tablet Tuesday,Thursday, Saturday and Sunday and 2 tablets on Monday, Wednesday and Friday.) 30 tablet 4  . hydrALAZINE (APRESOLINE) 100 MG tablet Take 1 tablet (100 mg total) by mouth 3 (three) times daily. 30 tablet 0  . Lancets (ONETOUCH DELICA PLUS [id number]) MISC Apply 1 each topically 3 (three) times daily.    Marland Kitchen loratadine (CLARITIN) 5 MG/5ML syrup Take 5 mLs (5 mg total) by  mouth daily. 60 mL 0  . magnesium hydroxide (MILK OF MAGNESIA) 400 MG/5ML suspension Take 15 mLs by mouth daily as needed for mild constipation.    . Multiple Vitamins-Minerals (MULTIVITAMIN ADULTS) TABS Take 1 tablet by mouth daily.    Marland Kitchen omeprazole (PRILOSEC) 40 MG capsule Take 40 mg by mouth daily.    Glory Rosebush VERIO test strip 1 each 3 (three) times daily.    Marland Kitchen Propylene Glycol (SYSTANE BALANCE OP) Apply 1 drop to eye daily as needed (for dry eyes).    . vitamin B-12 (CYANOCOBALAMIN) 1000 MCG tablet Take 1,000 mcg by mouth daily.     No current facility-administered medications for this visit.     Past Medical History:  Diagnosis Date  . ANEMIA, IRON DEFICIENCY 05/07/2009   Qualifier: Diagnosis of  By: Loanne Drilling MD, Jacelyn Pi   . ARTHRITIS 05/30/2008   Qualifier: Diagnosis of  By: Marland Mcalpine    . DIABETES MELLITUS, TYPE II 07/16/2007   Qualifier: Diagnosis of  By: Marca Ancona RMA, Lucy    . Diastolic congestive heart failure ([place]) 09/2017  . DIVERTICULOSIS, COLON 05/30/2008   Qualifier: Diagnosis of  By: Marland Mcalpine    . Dyspnea   . Glaucoma   . HYPERLIPIDEMIA 07/16/2007   Qualifier: Diagnosis of  By: Reatha Armour, Lucy    . Hypertension   . Normocytic anemia     Past Surgical History:  Procedure Laterality Date  . ABDOMINAL HYSTERECTOMY      Social History   Socioeconomic History  . Marital status:  Widowed    Spouse name: Not on file  . Number of children: Not on file  . Years of education: Not on file  . Highest education level: Not on file  Occupational History  . Not on file  Tobacco Use  . Smoking status: Former Research scientist (life sciences)  . Smokeless tobacco: Never Used  Vaping Use  . Vaping Use: Never used  Substance and Sexual Activity  . Alcohol use: No  . Drug use: No  . Sexual activity: Never  Other Topics Concern  . Not on file  Social History Narrative   Widowed.  No children.  Has a brother.     Social Determinants of Health   Financial Resource Strain: Not on  file  Food Insecurity: Not on file  Transportation Needs: Not on file  Physical Activity: Not on file  Stress: Not on file  Social Connections: Not on file  Intimate Partner Violence: Not on file    Family History  Problem Relation Age of Onset  . Diabetes Mellitus II Other   . Pancreatic cancer Mother   . Heart disease Father   . CAD Brother     ROS: no fevers or chills, productive cough, hemoptysis, dysphasia, odynophagia, melena, hematochezia, dysuria, hematuria, rash, seizure activity, orthopnea, PND, pedal edema, claudication. Remaining systems are negative.  Physical Exam: Well-developed well-nourished in no acute distress.  Skin is warm and dry.  HEENT is normal.  Neck is supple.  Chest is clear to auscultation with normal expansion.  Cardiovascular exam is regular rate and rhythm.  Abdominal exam nontender or distended. No masses palpated. Extremities show no edema. neuro grossly intact  ECG-January 03, 2021-sinus rhythm with Mobitz 1 second-degree AV block, left axis deviation.  Personally reviewed  A/P  1 bradycardia-patient heart rate 86 today.  She is now off of all AV nodal blocking agents.  Previous electrocardiogram showed sinus rhythm with Mobitz 1 second-degree AV block but she is having symptoms from this.  We will continue to follow.  May need pacemaker in the future.  2 chronic diastolic congestive heart failure-she is euvolemic today.  Continue diuretic at present dose.  Check potassium and renal function.  3 hypertension-blood pressure elevated; however she checks this at home and states her systolic is typically in the 120s to 130s.  We will continue present medications and adjust based on follow-up readings.  4 mitral regurgitation-mild on most recent echocardiogram.  5 chronic stage III kidney disease-Per nephrology.  Kirk Ruths, MD

## 2021-02-12 DIAGNOSIS — M25571 Pain in right ankle and joints of right foot: Secondary | ICD-10-CM | POA: Diagnosis not present

## 2021-02-12 DIAGNOSIS — M65871 Other synovitis and tenosynovitis, right ankle and foot: Secondary | ICD-10-CM | POA: Diagnosis not present

## 2021-02-14 ENCOUNTER — Other Ambulatory Visit: Payer: Self-pay

## 2021-02-14 ENCOUNTER — Encounter: Payer: Self-pay | Admitting: Cardiology

## 2021-02-14 ENCOUNTER — Ambulatory Visit: Payer: Medicare HMO | Admitting: Cardiology

## 2021-02-14 VITALS — BP 162/58 | HR 86 | Ht 67.0 in | Wt 142.0 lb

## 2021-02-14 DIAGNOSIS — R001 Bradycardia, unspecified: Secondary | ICD-10-CM | POA: Diagnosis not present

## 2021-02-14 DIAGNOSIS — N183 Chronic kidney disease, stage 3 unspecified: Secondary | ICD-10-CM | POA: Diagnosis not present

## 2021-02-14 DIAGNOSIS — I130 Hypertensive heart and chronic kidney disease with heart failure and stage 1 through stage 4 chronic kidney disease, or unspecified chronic kidney disease: Secondary | ICD-10-CM | POA: Diagnosis not present

## 2021-02-14 DIAGNOSIS — I5032 Chronic diastolic (congestive) heart failure: Secondary | ICD-10-CM | POA: Diagnosis not present

## 2021-02-14 DIAGNOSIS — I10 Essential (primary) hypertension: Secondary | ICD-10-CM

## 2021-02-14 LAB — BASIC METABOLIC PANEL
BUN/Creatinine Ratio: 28 (ref 12–28)
BUN: 55 mg/dL — ABNORMAL HIGH (ref 8–27)
CO2: 18 mmol/L — ABNORMAL LOW (ref 20–29)
Calcium: 9.4 mg/dL (ref 8.7–10.3)
Chloride: 105 mmol/L (ref 96–106)
Creatinine, Ser: 1.98 mg/dL — ABNORMAL HIGH (ref 0.57–1.00)
GFR calc Af Amer: 26 mL/min/{1.73_m2} — ABNORMAL LOW (ref >59)
GFR calc non Af Amer: 23 mL/min/{1.73_m2} — ABNORMAL LOW (ref >59)
Glucose: 264 mg/dL — ABNORMAL HIGH (ref 65–99)
Potassium: 4.5 mmol/L (ref 3.5–5.2)
Sodium: 140 mmol/L (ref 134–144)

## 2021-02-14 NOTE — Patient Instructions (Signed)

## 2021-02-15 ENCOUNTER — Telehealth: Payer: Self-pay | Admitting: *Deleted

## 2021-02-15 DIAGNOSIS — I5032 Chronic diastolic (congestive) heart failure: Secondary | ICD-10-CM

## 2021-02-15 NOTE — Telephone Encounter (Signed)
-----   Message from Lelon Perla, MD sent at 02/15/2021  7:11 AM EST ----- Change lasix to 20 mg daily as needed for weight gain of 2-3 lbs or worsening pedal edema/increased SOB Kirk Ruths

## 2021-02-15 NOTE — Telephone Encounter (Signed)
Left message for pt to call.

## 2021-02-19 NOTE — Telephone Encounter (Signed)
Spoke with pt, aware of mediation change.

## 2021-02-19 NOTE — Telephone Encounter (Signed)
Left message for pt to call.

## 2021-02-26 DIAGNOSIS — M65871 Other synovitis and tenosynovitis, right ankle and foot: Secondary | ICD-10-CM | POA: Diagnosis not present

## 2021-02-26 DIAGNOSIS — M7661 Achilles tendinitis, right leg: Secondary | ICD-10-CM | POA: Diagnosis not present

## 2021-03-11 ENCOUNTER — Encounter (HOSPITAL_BASED_OUTPATIENT_CLINIC_OR_DEPARTMENT_OTHER): Payer: Self-pay | Admitting: *Deleted

## 2021-03-11 ENCOUNTER — Emergency Department (HOSPITAL_BASED_OUTPATIENT_CLINIC_OR_DEPARTMENT_OTHER)
Admission: EM | Admit: 2021-03-11 | Discharge: 2021-03-11 | Disposition: A | Discharge status: Home / Self Care | Payer: Medicare HMO | Attending: Emergency Medicine | Admitting: Emergency Medicine

## 2021-03-11 ENCOUNTER — Other Ambulatory Visit: Payer: Self-pay

## 2021-03-11 DIAGNOSIS — Z79899 Other long term (current) drug therapy: Secondary | ICD-10-CM | POA: Insufficient documentation

## 2021-03-11 DIAGNOSIS — I10 Essential (primary) hypertension: Secondary | ICD-10-CM

## 2021-03-11 DIAGNOSIS — I5030 Unspecified diastolic (congestive) heart failure: Secondary | ICD-10-CM | POA: Diagnosis not present

## 2021-03-11 DIAGNOSIS — Z87891 Personal history of nicotine dependence: Secondary | ICD-10-CM | POA: Diagnosis not present

## 2021-03-11 DIAGNOSIS — E119 Type 2 diabetes mellitus without complications: Secondary | ICD-10-CM | POA: Diagnosis not present

## 2021-03-11 DIAGNOSIS — I110 Hypertensive heart disease with heart failure: Secondary | ICD-10-CM | POA: Diagnosis present

## 2021-03-11 LAB — CBG MONITORING, ED: Glucose-Capillary: 132 mg/dL — ABNORMAL HIGH (ref 70–99)

## 2021-03-11 LAB — CBC WITH DIFFERENTIAL/PLATELET
Abs Immature Granulocytes: 0.03 10*3/uL (ref 0.00–0.07)
Basophils Absolute: 0 10*3/uL (ref 0.0–0.1)
Basophils Relative: 1 %
Eosinophils Absolute: 0.1 10*3/uL (ref 0.0–0.5)
Eosinophils Relative: 1 %
HCT: 34.9 % — ABNORMAL LOW (ref 36.0–46.0)
Hemoglobin: 11.4 g/dL — ABNORMAL LOW (ref 12.0–15.0)
Immature Granulocytes: 0 %
Lymphocytes Relative: 14 %
Lymphs Abs: 0.9 10*3/uL (ref 0.7–4.0)
MCH: 28.8 pg (ref 26.0–34.0)
MCHC: 32.7 g/dL (ref 30.0–36.0)
MCV: 88.1 fL (ref 80.0–100.0)
Monocytes Absolute: 0.8 10*3/uL (ref 0.1–1.0)
Monocytes Relative: 12 %
Neutro Abs: 4.9 10*3/uL (ref 1.7–7.7)
Neutrophils Relative %: 72 %
Platelets: 307 10*3/uL (ref 150–400)
RBC: 3.96 MIL/uL (ref 3.87–5.11)
RDW: 14.3 % (ref 11.5–15.5)
WBC: 6.8 10*3/uL (ref 4.0–10.5)
nRBC: 0 % (ref 0.0–0.2)

## 2021-03-11 LAB — BASIC METABOLIC PANEL
Anion gap: 10 (ref 5–15)
BUN: 31 mg/dL — ABNORMAL HIGH (ref 8–23)
CO2: 20 mmol/L — ABNORMAL LOW (ref 22–32)
Calcium: 8.8 mg/dL — ABNORMAL LOW (ref 8.9–10.3)
Chloride: 105 mmol/L (ref 98–111)
Creatinine, Ser: 1.59 mg/dL — ABNORMAL HIGH (ref 0.44–1.00)
GFR, Estimated: 32 mL/min — ABNORMAL LOW (ref >60)
Glucose, Bld: 152 mg/dL — ABNORMAL HIGH (ref 70–99)
Potassium: 4.4 mmol/L (ref 3.5–5.1)
Sodium: 135 mmol/L (ref 135–145)

## 2021-03-11 MED ORDER — ACETAMINOPHEN 325 MG PO TABS
ORAL_TABLET | ORAL | Status: AC
  Filled 2021-03-11: qty 2

## 2021-03-11 MED ORDER — DOXAZOSIN MESYLATE 4 MG PO TABS
4.0000 mg | ORAL_TABLET | Freq: Every day | ORAL | Status: DC
  Filled 2021-03-11: qty 1

## 2021-03-11 MED ORDER — AMLODIPINE BESYLATE 5 MG PO TABS
10.0000 mg | ORAL_TABLET | Freq: Once | ORAL | Status: AC
  Administered 2021-03-11: 10 mg via ORAL
  Filled 2021-03-11: qty 2

## 2021-03-11 MED ORDER — DOXAZOSIN MESYLATE 2 MG PO TABS: ORAL_TABLET | ORAL | 0 refills | Status: DC

## 2021-03-11 MED ORDER — AMLODIPINE BESYLATE 10 MG PO TABS: 10.0000 mg | ORAL_TABLET | Freq: Every day | ORAL | 0 refills | Status: DC

## 2021-03-11 MED ORDER — HYDRALAZINE HCL 20 MG/ML IJ SOLN
10.0000 mg | Freq: Once | INTRAMUSCULAR | Status: AC
  Administered 2021-03-11: 10 mg via INTRAVENOUS
  Filled 2021-03-11: qty 1

## 2021-03-11 MED ORDER — ACETAMINOPHEN 325 MG PO TABS
650.0000 mg | ORAL_TABLET | Freq: Once | ORAL | Status: AC
  Administered 2021-03-11: 650 mg via ORAL

## 2021-03-11 NOTE — ED Triage Notes (Signed)
Her BP has been elevated for a week. Denies pain.

## 2021-03-11 NOTE — ED Provider Notes (Signed)
[facility] EMERGENCY DEPARTMENT Provider Note  CSN: [id number] Arrival date & time: 03/11/21 1814    History Chief Complaint  Patient presents with  . Hypertension    HPI  Krista Rosales is a 87 y.o. female presents for evaluation of HTN. She has a history of same, was recently weaned off of clonidine patch and stopped Norvasc by cardiology due to symptomatic second degree AV block. She is currently only taking Hydralazine. She and family at bedside have noted increasing BP for the last several days including readings as high as 233 at home. She denies any CP, SOB but does have occasional headaches. She has been taking her hydralazine as prescribed without improvement.    Past Medical History:  Diagnosis Date  . ANEMIA, IRON DEFICIENCY 05/07/2009   Qualifier: Diagnosis of  By: Loanne Drilling MD, Jacelyn Pi   . ARTHRITIS 05/30/2008   Qualifier: Diagnosis of  By: Marland Mcalpine    . DIABETES MELLITUS, TYPE II 07/16/2007   Qualifier: Diagnosis of  By: Marca Ancona RMA, Lucy    . Diastolic congestive heart failure ([place]) 09/2017  . DIVERTICULOSIS, COLON 05/30/2008   Qualifier: Diagnosis of  By: Marland Mcalpine    . Dyspnea   . Glaucoma   . HYPERLIPIDEMIA 07/16/2007   Qualifier: Diagnosis of  By: Reatha Armour, Lucy    . Hypertension   . Normocytic anemia     Past Surgical History:  Procedure Laterality Date  . ABDOMINAL HYSTERECTOMY      Family History  Problem Relation Age of Onset  . Diabetes Mellitus II Other   . Pancreatic cancer Mother   . Heart disease Father   . CAD Brother     Social History   Tobacco Use  . Smoking status: Former Research scientist (life sciences)  . Smokeless tobacco: Never Used  Vaping Use  . Vaping Use: Never used  Substance Use Topics  . Alcohol use: No  . Drug use: No     Home Medications Prior to Admission medications   Medication Sig Start Date End Date Taking? Authorizing Provider  ALPRAZolam Duanne Moron) 0.5 MG tablet Take 0.25 mg by mouth 2 (two) times  daily. 09/07/17   [provider]  amLODipine (NORVASC) 10 MG tablet Take 1 tablet (10 mg total) by mouth daily. 03/11/21   Truddie Hidden, MD  cholecalciferol (VITAMIN D) 1000 UNITS tablet Take 2,000 Units by mouth daily.     [provider]  diclofenac Sodium (VOLTAREN) 1 % GEL Apply 4 g topically 4 (four) times daily. To affected joint. 06/27/20   Gregor Hams, MD  doxazosin (CARDURA) 2 MG tablet TAKE 1 TABLET(2 MG) BY MOUTH DAILY 03/11/21   Truddie Hidden, MD  hydrALAZINE (APRESOLINE) 100 MG tablet Take 1 tablet (100 mg total) by mouth 3 (three) times daily. 04/17/20   Isla Pence, MD  Lancets [facility] DELICA PLUS [id number]) MISC Apply 1 each topically 3 (three) times daily. 07/18/20   [provider]  magnesium hydroxide (MILK OF MAGNESIA) 400 MG/5ML suspension Take 15 mLs by mouth daily as needed for mild constipation.    [provider]  [facility] VERIO test strip 1 each 3 (three) times daily. 07/17/20   [provider]  furosemide (LASIX) 20 MG tablet One tablet once daily as needed for swelling Patient taking differently: Take 20 mg by mouth See admin instructions. Takes 1 tablet Tuesday,Thursday, Saturday and Sunday and 2 tablets on Monday, Wednesday and Friday. 11/15/18 03/11/21  Lelon Perla,  MD  loratadine (CLARITIN) 5 MG/5ML syrup Take 5 mLs (5 mg total) by mouth daily. 04/18/19 03/11/21  Wieters, Hallie C, PA-C  omeprazole (PRILOSEC) 40 MG capsule Take 40 mg by mouth daily. 09/12/20 03/11/21  [provider]     Allergies    Wellbutrin [bupropion], Codeine, and Penicillins   Review of Systems   Review of Systems A comprehensive review of systems was completed and negative except as noted in HPI.    Physical Exam BP (!) 197/67   Pulse 67   Temp 98.5 F (36.9 C) (Oral)   Resp 15   Ht 5\' 7"  (1.702 m)   Wt 66.6 kg   SpO2 100%   BMI 23.00 kg/m   Physical Exam Vitals and nursing note reviewed.  Constitutional:       Appearance: Normal appearance.  HENT:     Head: Normocephalic and atraumatic.     Nose: Nose normal.     Mouth/Throat:     Mouth: Mucous membranes are moist.  Eyes:     Extraocular Movements: Extraocular movements intact.     Conjunctiva/sclera: Conjunctivae normal.  Cardiovascular:     Rate and Rhythm: Normal rate. Rhythm irregular.     Heart sounds: Murmur heard.    Pulmonary:     Effort: Pulmonary effort is normal.     Breath sounds: Normal breath sounds.  Abdominal:     General: Abdomen is flat.     Palpations: Abdomen is soft.     Tenderness: There is no abdominal tenderness.  Musculoskeletal:        General: No swelling. Normal range of motion.     Cervical back: Neck supple.  Skin:    General: Skin is warm and dry.  Neurological:     General: No focal deficit present.     Mental Status: She is alert.  Psychiatric:        Mood and Affect: Mood normal.      ED Results / Procedures / Treatments   Labs (all labs ordered are listed, but only abnormal results are displayed) Labs Reviewed  BASIC METABOLIC PANEL - Abnormal; Notable for the following components:      Result Value   CO2 20 (*)    Glucose, Bld 152 (*)    BUN 31 (*)    Creatinine, Ser 1.59 (*)    Calcium 8.8 (*)    GFR, Estimated 32 (*)    All other components within normal limits  CBC WITH DIFFERENTIAL/PLATELET - Abnormal; Notable for the following components:   Hemoglobin 11.4 (*)    HCT 34.9 (*)    All other components within normal limits  CBG MONITORING, ED - Abnormal; Notable for the following components:   Glucose-Capillary 132 (*)    All other components within normal limits    EKG EKG Interpretation  Date/Time:  Monday March 11 2021 19:12:38 EDT Ventricular Rate:  70 PR Interval:    QRS Duration: 97 QT Interval:  433 QTC Calculation: 468 R Axis:   -73 Text Interpretation: Sinus rhythm Prolonged PR interval Inferior infarct, old Since last tracing Sinus rhythm has replaced  Accelerated junctional rhythm Confirmed by Calvert Cantor [phone]) on 03/11/2021 7:25:10 PM    Radiology No results found.  Procedures Procedures  Medications Ordered in the ED Medications  doxazosin (CARDURA) tablet 4 mg (4 mg Oral Not Given 03/11/21 1952)  acetaminophen (TYLENOL) 325 MG tablet (has no administration in time range)  hydrALAZINE (APRESOLINE) injection 10 mg (10 mg Intravenous  Given 03/11/21 1949)  amLODipine (NORVASC) tablet 10 mg (10 mg Oral Given 03/11/21 1948)  acetaminophen (TYLENOL) tablet 650 mg (650 mg Oral Given 03/11/21 2140)     MDM Rules/Calculators/A&P MDM Patient arrives with markedly elevated BP and tachycardia with EKG appearing to show a junctional rhythm. On my initial eval in the room her BP has improved marginally but HR is much better and repeat EKG shows sinus with prolonged PR but no signs of high degree AV block. Given her recent medication changes with cardiology, will discuss optimal management with on-call cardiologist.  ED Course  I have reviewed the triage vital signs and the nursing notes.  Pertinent labs & imaging results that were available during my care of the patient were reviewed by me and considered in my medical decision making (see chart for details).  Clinical Course as of 03/11/21 2154  Mon Mar 11, 2021  1945 Spoke with Dr. Sallyanne Kuster, Cardiology who has reviewed her recent visits and her EKGs today. There seems to be some confusion regarding her medication regimen. He does not see any mention of stopping the Norvasc or Cardura. She did have her Lasix held for increased creatinine but it is back down now. He recommends hydralazine IV for now and restart her Amlodipine and Cardura here. He also recommends restarting Lasix at 40mg  daily.  [CS]  2008 Discussed Dr. Victorino December recommendations with patient and family at bedside. She is apprehensive about restarting the lasix at this point given her worsening kidney function previously. Also  we do not have Cardura available here. Will give a dose of hydralazine IV and oral amlodipine and continue to monitor.   [CS]    Clinical Course User Index [CS] Truddie Hidden, MD    Final Clinical Impression(s) / ED Diagnoses Final diagnoses:  Hypertension, unspecified type    Rx / DC Orders ED Discharge Orders         Ordered    amLODipine (NORVASC) 10 MG tablet  Daily        03/11/21 2129    doxazosin (CARDURA) 2 MG tablet        03/11/21 2129           Truddie Hidden, MD 03/11/21 2154

## 2021-03-11 NOTE — ED Notes (Signed)
ED Provider at bedside. 

## 2021-03-11 NOTE — Progress Notes (Signed)
Discussed with Dr. Karle Starch. The patient presented today with elevated blood pressure and headache.  233/80. It appears that she misunderstood medication instructions and stopped virtually all her antihypertensive medications including amlodipine.  (Some of her previous medications were discontinued due to second-degree AV block). Furosemide is also reduced, but creatinine has improved from 1.98 on 02/14/2021, back to 1.59 today (this appears to be her baseline over the last 3 years or so). Does not have shortness of breath, overt congestive heart failure, chest discomfort or focal neurological complaints. Recommend resuming her amlodipine 10 mg once daily and can increase the doxazosin to 4 mg daily.  Would also restart furosemide 40 mg daily, since it's quite likely she will develop edema on multiple potent vasodilator medications and the diuretic should also help with her blood pressure.   We will make arrangements for follow-up visit in the clinic.

## 2021-03-11 NOTE — Discharge Instructions (Addendum)
Please continue your Hydralazine as prescribed. Restart your Cardura (doxazosin) and your Norvasc (amlodipine). Discuss your Lasix (furosemide) with your doctor.

## 2021-03-12 ENCOUNTER — Telehealth: Payer: Self-pay | Admitting: Cardiology

## 2021-03-12 NOTE — Telephone Encounter (Signed)
Spoke with pt, Follow up scheduled  

## 2021-03-15 ENCOUNTER — Ambulatory Visit: Payer: Medicare HMO | Admitting: Cardiology

## 2021-03-15 ENCOUNTER — Encounter: Payer: Self-pay | Admitting: Cardiology

## 2021-03-15 ENCOUNTER — Other Ambulatory Visit: Payer: Self-pay

## 2021-03-15 VITALS — BP 140/58 | HR 103 | Ht 69.0 in | Wt 148.0 lb

## 2021-03-15 DIAGNOSIS — R001 Bradycardia, unspecified: Secondary | ICD-10-CM | POA: Diagnosis not present

## 2021-03-15 DIAGNOSIS — I10 Essential (primary) hypertension: Secondary | ICD-10-CM

## 2021-03-15 DIAGNOSIS — I5032 Chronic diastolic (congestive) heart failure: Secondary | ICD-10-CM | POA: Diagnosis not present

## 2021-03-15 MED ORDER — HYDRALAZINE HCL 100 MG PO TABS: 100.0000 mg | ORAL_TABLET | Freq: Three times a day (TID) | ORAL | 3 refills | Status: DC

## 2021-03-15 MED ORDER — DOXAZOSIN MESYLATE 2 MG PO TABS: ORAL_TABLET | ORAL | 3 refills | Status: DC

## 2021-03-15 MED ORDER — AMLODIPINE BESYLATE 10 MG PO TABS: 10.0000 mg | ORAL_TABLET | Freq: Every day | ORAL | 3 refills | Status: AC

## 2021-03-15 NOTE — Patient Instructions (Signed)

## 2021-03-15 NOTE — Progress Notes (Signed)
HPI: FUbradycardia and hypertension. Nuclear study January 2019 showed ejection fraction 46% and no ischemia or infarction. Ejection fraction felt betterthancalculated number. Echocardiogram inJanuary 2020 showed normal LV function, moderate left ventricular hypertrophy, mild diastolic dysfunction,mild mitral regurgitation.Monitor August 2020 showed sinus bradycardia with first-degree AV block, normal sinus rhythm, sinus tachycardia, intermittent Mobitz 1 second-degree AV block, multiple pauses with longest being 3.4 seconds, occasional PAC and PVC. Clonidine patch weanedto off.  Patient seen on March 21 in the emergency room due to elevated blood pressure.  Patient had inadvertently stopped her amlodipine and Cardura as well as Lasix.  Since last seenshe has dyspnea on exertion at times unchanged.  No orthopnea, PND, pedal edema, chest pain or syncope.  No palpitations.  Current Outpatient Medications  Medication Sig Dispense Refill  . ALPRAZolam (XANAX) 0.5 MG tablet Take 0.25 mg by mouth 2 (two) times daily.  0  . amLODipine (NORVASC) 10 MG tablet Take 1 tablet (10 mg total) by mouth daily. 30 tablet 0  . aspirin EC 81 MG tablet Take 81 mg by mouth daily. Swallow whole.    . cholecalciferol (VITAMIN D) 1000 UNITS tablet Take 2,000 Units by mouth daily.     . diclofenac Sodium (VOLTAREN) 1 % GEL Apply 4 g topically 4 (four) times daily. To affected joint. 400 g 11  . doxazosin (CARDURA) 2 MG tablet TAKE 1 TABLET(2 MG) BY MOUTH DAILY 30 tablet 0  . hydrALAZINE (APRESOLINE) 100 MG tablet Take 1 tablet (100 mg total) by mouth 3 (three) times daily. 30 tablet 0  . Lancets (ONETOUCH DELICA PLUS [id number]) MISC Apply 1 each topically 3 (three) times daily.    . magnesium hydroxide (MILK OF MAGNESIA) 400 MG/5ML suspension Take 15 mLs by mouth daily as needed for mild constipation.    Krista Rosales VERIO test strip 1 each 3 (three) times daily.     No current facility-administered medications  for this visit.     Past Medical History:  Diagnosis Date  . ANEMIA, IRON DEFICIENCY 05/07/2009   Qualifier: Diagnosis of  By: Loanne Drilling MD, Jacelyn Pi   . ARTHRITIS 05/30/2008   Qualifier: Diagnosis of  By: Marland Mcalpine    . DIABETES MELLITUS, TYPE II 07/16/2007   Qualifier: Diagnosis of  By: Marca Ancona RMA, Lucy    . Diastolic congestive heart failure ([place]) 09/2017  . DIVERTICULOSIS, COLON 05/30/2008   Qualifier: Diagnosis of  By: Marland Mcalpine    . Dyspnea   . Glaucoma   . HYPERLIPIDEMIA 07/16/2007   Qualifier: Diagnosis of  By: Reatha Armour, Lucy    . Hypertension   . Normocytic anemia     Past Surgical History:  Procedure Laterality Date  . ABDOMINAL HYSTERECTOMY      Social History   Socioeconomic History  . Marital status: Widowed    Spouse name: Not on file  . Number of children: Not on file  . Years of education: Not on file  . Highest education level: Not on file  Occupational History  . Not on file  Tobacco Use  . Smoking status: Former Research scientist (life sciences)  . Smokeless tobacco: Never Used  Vaping Use  . Vaping Use: Never used  Substance and Sexual Activity  . Alcohol use: No  . Drug use: No  . Sexual activity: Never  Other Topics Concern  . Not on file  Social History Narrative   Widowed.  No children.  Has a brother.     Social Determinants of  Health   Financial Resource Strain: Not on file  Food Insecurity: Not on file  Transportation Needs: Not on file  Physical Activity: Not on file  Stress: Not on file  Social Connections: Not on file  Intimate Partner Violence: Not on file    Family History  Problem Relation Age of Onset  . Diabetes Mellitus II Other   . Pancreatic cancer Mother   . Heart disease Father   . CAD Brother     ROS: no fevers or chills, productive cough, hemoptysis, dysphasia, odynophagia, melena, hematochezia, dysuria, hematuria, rash, seizure activity, orthopnea, PND, pedal edema, claudication. Remaining systems are  negative.  Physical Exam: Well-developed well-nourished in no acute distress.  Skin is warm and dry.  HEENT is normal.  Neck is supple.  Chest is clear to auscultation with normal expansion.  Cardiovascular exam is regular rate and rhythm.  Abdominal exam nontender or distended. No masses palpated. Extremities show no edema. neuro grossly intact  ECG-accelerated junctional rhythm, left axis deviation, heart rate 99.  Personally reviewed  A/P  1 bradycardia-patient remains asymptomatic.  She is in accelerated junctional rhythm today with heart rate in the 90s.  Continue to avoid all AV nodal blocking agents.  She has had Mobitz 1 second-degree AV block in the past.  May require pacemaker in the future but will follow for now.  2 chronic diastolic congestive heart failure-she is euvolemic on examination.  She will take Lasix 40 mg daily as needed.  3 hypertension-patient's blood pressure was recently elevated.  However she was confused with instructions and was not taking amlodipine, Cardura or Lasix.  These have been resumed.  We will follow blood pressure and adjust medications as needed.  4 chronic stage III kidney disease-followed by nephrology.  5 mitral regurgitation-mild on most recent echocardiogram.  Kirk Ruths, MD

## 2021-03-19 DIAGNOSIS — F411 Generalized anxiety disorder: Secondary | ICD-10-CM | POA: Diagnosis not present

## 2021-03-19 DIAGNOSIS — E1165 Type 2 diabetes mellitus with hyperglycemia: Secondary | ICD-10-CM | POA: Diagnosis not present

## 2021-03-19 DIAGNOSIS — J449 Chronic obstructive pulmonary disease, unspecified: Secondary | ICD-10-CM | POA: Diagnosis not present

## 2021-03-19 DIAGNOSIS — E785 Hyperlipidemia, unspecified: Secondary | ICD-10-CM | POA: Diagnosis not present

## 2021-03-19 DIAGNOSIS — I509 Heart failure, unspecified: Secondary | ICD-10-CM | POA: Diagnosis not present

## 2021-03-19 DIAGNOSIS — Z79899 Other long term (current) drug therapy: Secondary | ICD-10-CM | POA: Diagnosis not present

## 2021-03-19 DIAGNOSIS — E1159 Type 2 diabetes mellitus with other circulatory complications: Secondary | ICD-10-CM | POA: Diagnosis not present

## 2021-03-19 DIAGNOSIS — E1142 Type 2 diabetes mellitus with diabetic polyneuropathy: Secondary | ICD-10-CM | POA: Diagnosis not present

## 2021-03-19 DIAGNOSIS — I110 Hypertensive heart disease with heart failure: Secondary | ICD-10-CM | POA: Diagnosis not present

## 2021-03-19 DIAGNOSIS — L84 Corns and callosities: Secondary | ICD-10-CM | POA: Diagnosis not present

## 2021-03-19 DIAGNOSIS — R634 Abnormal weight loss: Secondary | ICD-10-CM | POA: Diagnosis not present

## 2021-04-16 ENCOUNTER — Other Ambulatory Visit: Payer: Medicare HMO

## 2021-04-16 ENCOUNTER — Other Ambulatory Visit: Payer: Self-pay

## 2021-04-23 ENCOUNTER — Other Ambulatory Visit: Payer: Self-pay

## 2021-04-23 ENCOUNTER — Ambulatory Visit: Payer: Medicare HMO | Admitting: Podiatry

## 2021-04-23 DIAGNOSIS — L84 Corns and callosities: Secondary | ICD-10-CM | POA: Diagnosis not present

## 2021-04-23 DIAGNOSIS — E1149 Type 2 diabetes mellitus with other diabetic neurological complication: Secondary | ICD-10-CM

## 2021-04-23 DIAGNOSIS — B351 Tinea unguium: Secondary | ICD-10-CM

## 2021-04-23 DIAGNOSIS — M21621 Bunionette of right foot: Secondary | ICD-10-CM | POA: Diagnosis not present

## 2021-04-23 DIAGNOSIS — M21622 Bunionette of left foot: Secondary | ICD-10-CM

## 2021-04-23 DIAGNOSIS — M21619 Bunion of unspecified foot: Secondary | ICD-10-CM | POA: Diagnosis not present

## 2021-04-23 DIAGNOSIS — E119 Type 2 diabetes mellitus without complications: Secondary | ICD-10-CM

## 2021-04-23 NOTE — Progress Notes (Signed)
Subjective:   Patient ID: Krista Rosales, female   DOB: 87 y.o.   MRN: [id number]   HPI 87 year old female presents the office today requesting diabetic shoes.  Her last diabetic shoes were over 45 to 85 years old.  She is referred for diabetic shoes given neuropathy and callus formation by her primary care physician.  She denies any ulcerations.  She states that she checks her blood sugar to 3 times a day and it runs between 80s to 115's.  PCP: Dr. Marcellus Scott, MD   Review of Systems  All other systems reviewed and are negative.  Past Medical History:  Diagnosis Date  . ANEMIA, IRON DEFICIENCY 05/07/2009   Qualifier: Diagnosis of  By: Loanne Drilling MD, Jacelyn Pi   . ARTHRITIS 05/30/2008   Qualifier: Diagnosis of  By: Marland Mcalpine    . DIABETES MELLITUS, TYPE II 07/16/2007   Qualifier: Diagnosis of  By: Marca Ancona RMA, Lucy    . Diastolic congestive heart failure ([place]) 09/2017  . DIVERTICULOSIS, COLON 05/30/2008   Qualifier: Diagnosis of  By: Marland Mcalpine    . Dyspnea   . Glaucoma   . HYPERLIPIDEMIA 07/16/2007   Qualifier: Diagnosis of  By: Reatha Armour, Lucy    . Hypertension   . Normocytic anemia     Past Surgical History:  Procedure Laterality Date  . ABDOMINAL HYSTERECTOMY       Current Outpatient Medications:  .  ALPRAZolam (XANAX) 0.5 MG tablet, Take 0.25 mg by mouth 2 (two) times daily., Disp: , Rfl: 0 .  amLODipine (NORVASC) 10 MG tablet, Take 1 tablet (10 mg total) by mouth daily., Disp: 90 tablet, Rfl: 3 .  aspirin EC 81 MG tablet, Take 81 mg by mouth daily. Swallow whole., Disp: , Rfl:  .  cholecalciferol (VITAMIN D) 1000 UNITS tablet, Take 2,000 Units by mouth daily. , Disp: , Rfl:  .  diclofenac Sodium (VOLTAREN) 1 % GEL, Apply 4 g topically 4 (four) times daily. To affected joint., Disp: 400 g, Rfl: 11 .  doxazosin (CARDURA) 2 MG tablet, TAKE 1 TABLET(2 MG) BY MOUTH DAILY, Disp: 90 tablet, Rfl: 3 .  hydrALAZINE (APRESOLINE) 100 MG tablet, Take 1 tablet (100 mg total)  by mouth 3 (three) times daily., Disp: 270 tablet, Rfl: 3 .  Lancets (ONETOUCH DELICA PLUS [id number]) MISC, Apply 1 each topically 3 (three) times daily., Disp: , Rfl:  .  magnesium hydroxide (MILK OF MAGNESIA) 400 MG/5ML suspension, Take 15 mLs by mouth daily as needed for mild constipation., Disp: , Rfl:  .  ONETOUCH VERIO test strip, 1 each 3 (three) times daily., Disp: , Rfl:   Allergies  Allergen Reactions  . Wellbutrin [Bupropion]     unknown  . Codeine     hallucinate  . Penicillins Rash    Did it involve swelling of the face/tongue/throat, SOB, or low BP? Y Did it involve sudden or severe rash/hives, skin peeling, or any reaction on the inside of your mouth or nose? Y Did you need to seek medical attention at a hospital or doctor's office? Y When did it last happen?While at hospital If all above answers are "NO", may proceed with cephalosporin use.          Objective:  Physical Exam  General: AAO x3, NAD  Dermatological: Minimal callus formation present along the right tailor's bunion.  No ulceration.  The nails are hypertrophic, dystrophic with brown discoloration nails 2 through 5 bilaterally.  No edema, erythema or signs  of infection.  No open lesions.  Vascular: Dorsalis Pedis artery and Posterior Tibial artery pedal pulses are 1/4 bilateral with immedate capillary fill time.  There is no pain with calf compression, swelling, warmth, erythema.   Neruologic: Sensation decreased with Semmes Weinstein monofilament.  Musculoskeletal: Bunion, tailor's bunion, hammertoes are present.  Muscular strength 5/5 in all groups tested bilateral.   Assessment:   87 year old female presents for diabetic shoes     Plan:  -Treatment options discussed including all alternatives, risks, and complications -Etiology of symptoms were discussed -She was measured for diabetic shoes today by Cranford Mon, CMA -As a courtesy I sharply debrided the nails x8 without any complications or  bleeding. -Discussed daily foot inspection.

## 2021-05-02 DIAGNOSIS — E113593 Type 2 diabetes mellitus with proliferative diabetic retinopathy without macular edema, bilateral: Secondary | ICD-10-CM | POA: Diagnosis not present

## 2021-05-02 DIAGNOSIS — H35373 Puckering of macula, bilateral: Secondary | ICD-10-CM | POA: Diagnosis not present

## 2021-05-02 DIAGNOSIS — H35362 Drusen (degenerative) of macula, left eye: Secondary | ICD-10-CM | POA: Diagnosis not present

## 2021-05-02 DIAGNOSIS — H43822 Vitreomacular adhesion, left eye: Secondary | ICD-10-CM | POA: Diagnosis not present

## 2021-05-07 DIAGNOSIS — M65871 Other synovitis and tenosynovitis, right ankle and foot: Secondary | ICD-10-CM | POA: Diagnosis not present

## 2021-05-07 DIAGNOSIS — M25571 Pain in right ankle and joints of right foot: Secondary | ICD-10-CM | POA: Diagnosis not present

## 2021-05-22 DIAGNOSIS — M25571 Pain in right ankle and joints of right foot: Secondary | ICD-10-CM | POA: Diagnosis not present

## 2021-05-22 DIAGNOSIS — M21621 Bunionette of right foot: Secondary | ICD-10-CM | POA: Diagnosis not present

## 2021-05-22 DIAGNOSIS — M7671 Peroneal tendinitis, right leg: Secondary | ICD-10-CM | POA: Diagnosis not present

## 2021-05-28 DIAGNOSIS — Z0000 Encounter for general adult medical examination without abnormal findings: Secondary | ICD-10-CM | POA: Diagnosis not present

## 2021-05-28 DIAGNOSIS — Z79899 Other long term (current) drug therapy: Secondary | ICD-10-CM | POA: Diagnosis not present

## 2021-05-28 DIAGNOSIS — E1165 Type 2 diabetes mellitus with hyperglycemia: Secondary | ICD-10-CM | POA: Diagnosis not present

## 2021-05-28 DIAGNOSIS — E1142 Type 2 diabetes mellitus with diabetic polyneuropathy: Secondary | ICD-10-CM | POA: Diagnosis not present

## 2021-05-28 DIAGNOSIS — E785 Hyperlipidemia, unspecified: Secondary | ICD-10-CM | POA: Diagnosis not present

## 2021-05-28 DIAGNOSIS — I130 Hypertensive heart and chronic kidney disease with heart failure and stage 1 through stage 4 chronic kidney disease, or unspecified chronic kidney disease: Secondary | ICD-10-CM | POA: Diagnosis not present

## 2021-05-28 DIAGNOSIS — I509 Heart failure, unspecified: Secondary | ICD-10-CM | POA: Diagnosis not present

## 2021-05-28 DIAGNOSIS — I700 Atherosclerosis of aorta: Secondary | ICD-10-CM | POA: Diagnosis not present

## 2021-05-28 DIAGNOSIS — E559 Vitamin D deficiency, unspecified: Secondary | ICD-10-CM | POA: Diagnosis not present

## 2021-05-28 DIAGNOSIS — I10 Essential (primary) hypertension: Secondary | ICD-10-CM | POA: Diagnosis not present

## 2021-05-28 DIAGNOSIS — N184 Chronic kidney disease, stage 4 (severe): Secondary | ICD-10-CM | POA: Diagnosis not present

## 2021-05-28 DIAGNOSIS — J449 Chronic obstructive pulmonary disease, unspecified: Secondary | ICD-10-CM | POA: Diagnosis not present

## 2021-05-28 DIAGNOSIS — Z1389 Encounter for screening for other disorder: Secondary | ICD-10-CM | POA: Diagnosis not present

## 2021-06-07 ENCOUNTER — Encounter: Payer: Self-pay | Admitting: Family Medicine

## 2021-06-07 ENCOUNTER — Ambulatory Visit (INDEPENDENT_AMBULATORY_CARE_PROVIDER_SITE_OTHER): Payer: Medicare HMO | Admitting: Family Medicine

## 2021-06-07 ENCOUNTER — Other Ambulatory Visit: Payer: Self-pay

## 2021-06-07 ENCOUNTER — Ambulatory Visit: Payer: Self-pay

## 2021-06-07 ENCOUNTER — Ambulatory Visit (INDEPENDENT_AMBULATORY_CARE_PROVIDER_SITE_OTHER): Payer: Medicare HMO

## 2021-06-07 VITALS — BP 120/52 | HR 90 | Ht 69.0 in | Wt 149.8 lb

## 2021-06-07 DIAGNOSIS — M25531 Pain in right wrist: Secondary | ICD-10-CM

## 2021-06-07 DIAGNOSIS — M79641 Pain in right hand: Secondary | ICD-10-CM

## 2021-06-07 NOTE — Patient Instructions (Signed)
Thank you for coming in today.   Call or go to the ER if you develop a large red swollen joint with extreme pain or oozing puss.    Please use Voltaren gel (Generic Diclofenac Gel) up to 4x daily for pain as needed.  This is available over-the-counter as both the name brand Voltaren gel and the generic diclofenac gel.   Recheck soon if you are not getting better.

## 2021-06-07 NOTE — Progress Notes (Signed)
I, Krista Rosales, LAT, ATC, am serving as scribe for Dr. [name].  Krista Rosales is a 87 y.o. female who presents to [place] at [facility] today for R hand and wrist pain since Monday, June 13,2022.   Pt was previously seen by Dr. Georgina Snell on 01/18/21 for chronic bilat knee & L 3rd finger pain and was given bilat knee steroid injections. Pt completed a prior Gelsyn injection series on 05/16/20. Today, pt locates pain to her R wrist, hand and fingers.  She was moving a box of canned food on Monday and that's the only she can recall that might have contributed to her pain.  She reports that her R hand and wrist have been swollen since.  She is unable to make a fist.  She has been using Voltaren gel and taking Tylenol.  She notes the pain did not start until the day after she moved a box of canned goods.  She did not recall any specific injury or incident to cause the pain.   Pertinent review of systems: No fevers or chills  Relevant historical information: No history of gout   Exam:  BP (!) 120/52 (BP Location: Left Arm, Patient Position: Sitting, Cuff Size: Normal)   Pulse 90   Ht 5\' 9"  (1.753 m)   Wt 149 lb 12.8 oz (67.9 kg)   SpO2 96%   BMI 22.12 kg/m  General: Well Developed, well nourished, and in no acute distress.   MSK: Right wrist and hand wrist effusion with some swelling of the wrist and first MCP Decreased wrist motion with pain. Tender palpation dorsal wrist. Pulses cap refill and sensation are intact distally.    Lab and Radiology Results  Procedure: Real-time Ultrasound Guided Injection of right dorsal wrist Device: Philips Affiniti 50G Images permanently stored and available for review in PACS Ultrasound evaluation prior to injection reveals moderate wrist effusion dorsal wrist. Intact extensor tendons. Intact flexor tendons. Verbal informed consent obtained.  Discussed risks and benefits of procedure. Warned about infection bleeding damage  to structures skin hypopigmentation and fat atrophy among others. Patient expresses understanding and agreement Time-out conducted.   Noted no overlying erythema, induration, or other signs of local infection.   Skin prepped in a sterile fashion.   Local anesthesia: Topical Ethyl chloride.   With sterile technique and under real time ultrasound guidance: 40 mg of Kenalog and 2 mg of Marcaine injected into the wrist joint. Fluid seen entering the joint capsule.   Completed without difficulty   Pain immediately resolved suggesting accurate placement of the medication.   Advised to call if fevers/chills, erythema, induration, drainage, or persistent bleeding.   Images permanently stored and available for review in the ultrasound unit.  Impression: Technically successful ultrasound guided injection.   X-ray images right wrist obtained today personally and independently interpreted. No acute fractures visible. Degenerative changes present. Atherosclerosis radial artery present. Await for radiology review      Assessment and Plan: 87 y.o. female with right wrist pain and swelling without acute injury.  Thought to be exacerbation of DJD.  Gout is a possibility as well.  Discussed options.  Plan for steroid injection and Voltaren gel.  Recheck in a week or 2 if not improved.  Recommend wrist bracing.  Patient findings rigid wrist braces to be unremarkable.  Provided with an Ace wrap today in clinic and showed her how to reapply it herself.  She may benefit from a compressive wrist sleeve in the  future.   PDMP not reviewed this encounter. Orders Placed This Encounter  Procedures   [place] LIMITED JOINT SPACE STRUCTURES UP RIGHT(NO LINKED CHARGES)    Order Specific Question:   Reason for Exam (SYMPTOM  OR DIAGNOSIS REQUIRED)    Answer:   R wrist pain    Order Specific Question:   Preferred imaging location?    Answer:   [place]   DG Hand Complete Right    Standing  Status:   Future    Number of Occurrences:   1    Standing Expiration Date:   07/07/2021    Order Specific Question:   Reason for Exam (SYMPTOM  OR DIAGNOSIS REQUIRED)    Answer:   R hand pain    Order Specific Question:   Preferred imaging location?    Answer:   [facility]   No orders of the defined types were placed in this encounter.    Discussed warning signs or symptoms. Please see discharge instructions. Patient expresses understanding.   The above documentation has been reviewed and is accurate and complete [name], M.D.

## 2021-06-10 NOTE — Progress Notes (Signed)
X-ray right hand shows no fractures.  Arthritis changes are present.

## 2021-06-10 NOTE — Progress Notes (Signed)
I, Krista Rosales, LAT, ATC, am serving as scribe for Dr. Lynne Rosales.  Krista Rosales is a 87 y.o. female who presents to [place] at [facility] today for R 1st finger pain. Pt was previously seen by Dr. Georgina Snell on 06/07/21 for R wrist and hand pain and was given a R dorsal wrist steroid injection and was advised to use Voltaren gel and wear a wrist brace. Today, pt reports that the R wrist injection helped her wrist and hand pain.  She is now c/o of R index and middle finger pain at her PIP joints.  She states that she is unable to fully flex her R index and middle fingers at the PIP joint.  She states that she is using the Voltaren gel but notes that it makes her hand itch.  She has had similar injection into her PIP joint contralateral left hand second digit previously which helped.  She is interested in injections today.  As noted above she had a right wrist injection last week which is feeling a lot better now.  Dx imaging: 06/07/21 R hand XR  Pertinent review of systems: No fevers or chills  Relevant historical information: Hypertension.  Heart failure.  Diabetes   Exam:  BP 120/60 (BP Location: Right Arm, Patient Position: Sitting, Cuff Size: Normal)   Pulse (!) 56   Ht 5\' 9"  (1.753 m)   Wt 153 lb 12.8 oz (69.8 kg)   SpO2 97%   BMI 22.71 kg/m  General: Well Developed, well nourished, and in no acute distress.   MSK: Right hand no significant wrist swelling. Swelling and decreased motion second and third PIPs.  Tender to palpation.  No erythema.    Lab and Radiology Results No results found for this or any previous visit (from the past 72 hour(s)). DG Hand Complete Right  Result Date: 06/08/2021 CLINICAL DATA:  Right hand pain after lifting a heavy box 4 days ago. EXAM: RIGHT HAND - COMPLETE 3+ VIEW COMPARISON:  None. FINDINGS: There is no evidence of fracture or dislocation. Mild degenerative changes are seen in the first carpometacarpal joint and  multiple interphalangeal joints. Vascular calcifications are seen in the wrist and hand. IMPRESSION: No acute osseous injury. Electronically Signed   By: Zerita Boers M.D.   On: 06/08/2021 17:12   I, Krista Rosales, personally (independently) visualized and performed the interpretation of the images attached in this note.   Procedure: Real-time Ultrasound Guided Injection of right hand second PIP radial aspect Device: Philips Affiniti 50G Images permanently stored and available for review in PACS Large joint effusion present on ultrasound prior to injection Verbal informed consent obtained.  Discussed risks and benefits of procedure. Warned about infection bleeding damage to structures skin hypopigmentation and fat atrophy among others. Patient expresses understanding and agreement Time-out conducted.   Noted no overlying erythema, induration, or other signs of local infection.   Skin prepped in a sterile fashion.   Local anesthesia: Topical Ethyl chloride.   With sterile technique and under real time ultrasound guidance: 0.5 mL of Depo-Medrol 80 mg/mL solution and 0.5 mL of lidocaine injected into PIP. Fluid seen entering the joint capsule.   Completed without difficulty   Pain immediately resolved suggesting accurate placement of the medication.   Advised to call if fevers/chills, erythema, induration, drainage, or persistent bleeding.   Images permanently stored and available for review in the ultrasound unit.  Impression: Technically successful ultrasound guided injection.    Procedure: Real-time Ultrasound  Guided Injection of right hand third PIP radial aspect Device: Philips Affiniti 50G Images permanently stored and available for review in PACS Small joint effusion seen on ultrasound prior to injection Verbal informed consent obtained.  Discussed risks and benefits of procedure. Warned about infection bleeding damage to structures skin hypopigmentation and fat atrophy among  others. Patient expresses understanding and agreement Time-out conducted.   Noted no overlying erythema, induration, or other signs of local infection.   Skin prepped in a sterile fashion.   Local anesthesia: Topical Ethyl chloride.   With sterile technique and under real time ultrasound guidance: 0.5 mL of 80 mg/mL Depo-Medrol solution and 0.5 mL of lidocaine injected into third PIP. Fluid seen entering the joint capsule.   Completed without difficulty   Pain immediately resolved suggesting accurate placement of the medication.   Advised to call if fevers/chills, erythema, induration, drainage, or persistent bleeding.   Images permanently stored and available for review in the ultrasound unit.  Impression: Technically successful ultrasound guided injection.      Assessment and Plan: 87 y.o. female with pain and swelling second and third PIP secondary to hand DJD and joint effusion.  Patient is failing conservative management with time he home exercise program and Voltaren gel.  Wrist injection for similar wrist effusion last week helped a lot and she has had good benefit from prior hand injections PIPs left hand.  She would like to proceed with an injection today of the second and third PIP which I believe is reasonable.  Plan for steroid injection today.  Recheck as needed.  Continue conservative management.   PDMP not reviewed this encounter. Orders Placed This Encounter  Procedures   [place] LIMITED JOINT SPACE STRUCTURES UP RIGHT(NO LINKED CHARGES)    Order Specific Question:   Reason for Exam (SYMPTOM  OR DIAGNOSIS REQUIRED)    Answer:   R index finger pain    Order Specific Question:   Preferred imaging location?    Answer:   [place]   No orders of the defined types were placed in this encounter.    Discussed warning signs or symptoms. Please see discharge instructions. Patient expresses understanding.   The above documentation has been reviewed  and is accurate and complete Krista Rosales, M.D.

## 2021-06-11 ENCOUNTER — Ambulatory Visit (INDEPENDENT_AMBULATORY_CARE_PROVIDER_SITE_OTHER): Payer: Medicare HMO | Admitting: Family Medicine

## 2021-06-11 ENCOUNTER — Ambulatory Visit: Payer: Self-pay

## 2021-06-11 ENCOUNTER — Encounter: Payer: Self-pay | Admitting: Family Medicine

## 2021-06-11 ENCOUNTER — Other Ambulatory Visit: Payer: Self-pay

## 2021-06-11 VITALS — BP 120/60 | HR 56 | Ht 69.0 in | Wt 153.8 lb

## 2021-06-11 DIAGNOSIS — M79641 Pain in right hand: Secondary | ICD-10-CM | POA: Diagnosis not present

## 2021-06-11 DIAGNOSIS — M79644 Pain in right finger(s): Secondary | ICD-10-CM

## 2021-06-11 NOTE — Patient Instructions (Signed)
Thank you for coming in today.   Call or go to the ER if you develop a large red swollen joint with extreme pain or oozing puss.    Recheck as needed.    Please use Voltaren gel (Generic Diclofenac Gel) up to 4x daily for pain as needed.  This is available over-the-counter as both the name brand Voltaren gel and the generic diclofenac gel.   I am here for you as needed. Have a good summer.

## 2021-07-23 ENCOUNTER — Other Ambulatory Visit: Payer: Self-pay

## 2021-07-23 ENCOUNTER — Ambulatory Visit (INDEPENDENT_AMBULATORY_CARE_PROVIDER_SITE_OTHER): Payer: Medicare HMO | Admitting: Family Medicine

## 2021-07-23 ENCOUNTER — Ambulatory Visit: Payer: Self-pay

## 2021-07-23 ENCOUNTER — Encounter: Payer: Self-pay | Admitting: Family Medicine

## 2021-07-23 VITALS — BP 128/80 | HR 68 | Ht 69.0 in | Wt 151.4 lb

## 2021-07-23 DIAGNOSIS — M79641 Pain in right hand: Secondary | ICD-10-CM

## 2021-07-23 DIAGNOSIS — M79645 Pain in left finger(s): Secondary | ICD-10-CM

## 2021-07-23 DIAGNOSIS — M79644 Pain in right finger(s): Secondary | ICD-10-CM | POA: Diagnosis not present

## 2021-07-23 NOTE — Patient Instructions (Signed)
Thank you for coming in today.   Call or go to the ER if you develop a large red swollen joint with extreme pain or oozing puss.    Please use Voltaren gel (Generic Diclofenac Gel) up to 4x daily for pain as needed.  This is available over-the-counter as both the name brand Voltaren gel and the generic diclofenac gel.   Return as needed.

## 2021-07-23 NOTE — Progress Notes (Signed)
I, [name], LAT, ATC, am serving as scribe for Dr. [name].  Krista Rosales is a 87 y.o. female who presents to [place] at [facility] today for f/u of hand and finger pain.  She was last seen by Dr. Georgina Snell on 06/11/21 for R index and middle finger pain at the PIP joints and had injections into both fingers.  She has tried Voltaren gel but does not prefer this medication as it makes her hand itch.  Since her last visit, pt reports PIP joints on the L 3rd finger and R 2nd finger have been bothering her. Pt notes L 3rd finger is swollen and she is unable to make a fist.  Diagnostic imaging: R hand XR- 06/07/21  Pertinent review of systems: No fevers or chills  Relevant historical information: Heart failure   Exam:  BP 128/80   Pulse 68   Ht 5\' 9"  (1.753 m)   Wt 151 lb 6.4 oz (68.7 kg)   SpO2 93%   BMI 22.36 kg/m  General: Well Developed, well nourished, and in no acute distress.   MSK: Right second PIP swollen tender palpation. Left third PIP swollen tender to palpation.  Decreased motion of the digits.  Pulses cap refill and sensation are intact.  Lab and Radiology Results  Procedure: Real-time Ultrasound Guided Injection of left third PIP radial aspect Device: Philips Affiniti 50G Images permanently stored and available for review in PACS Verbal informed consent obtained.  Discussed risks and benefits of procedure. Warned about infection bleeding damage to structures skin hypopigmentation and fat atrophy among others. Patient expresses understanding and agreement Time-out conducted.   Noted no overlying erythema, induration, or other signs of local infection.   Skin prepped in a sterile fashion.   Local anesthesia: Topical Ethyl chloride.   With sterile technique and under real time ultrasound guidance: 0.5 mL of 80 mg/mL Depo-Medrol solution and 0.5 mL of lidocaine injected into PIP joint. Fluid seen entering the joint capsule.   Completed  without difficulty   Pain immediately resolved suggesting accurate placement of the medication.   Advised to call if fevers/chills, erythema, induration, drainage, or persistent bleeding.   Images permanently stored and available for review in the ultrasound unit.  Impression: Technically successful ultrasound guided injection.    Procedure: Real-time Ultrasound Guided Injection of right second PIP radial aspect Device: Philips Affiniti 50G Images permanently stored and available for review in PACS Verbal informed consent obtained.  Discussed risks and benefits of procedure. Warned about infection bleeding damage to structures skin hypopigmentation and fat atrophy among others. Patient expresses understanding and agreement Time-out conducted.   Noted no overlying erythema, induration, or other signs of local infection.   Skin prepped in a sterile fashion.   Local anesthesia: Topical Ethyl chloride.   With sterile technique and under real time ultrasound guidance: 0.25 mL of 80 mg/mL solution of Depo-Medrol and 0.25 mL of lidocaine injected into second PIP joint. Fluid seen entering the joint capsule.   Completed without difficulty   Pain immediately resolved suggesting accurate placement of the medication.   Advised to call if fevers/chills, erythema, induration, drainage, or persistent bleeding.   Images permanently stored and available for review in the ultrasound unit.  Impression: Technically successful ultrasound guided injection.         Assessment and Plan: 87 y.o. female with swelling and pain of right second and left third PIP joints.  Said to be due to DJD.  She has  had similar episodes of this issue with another joints in her hands.  Plan for steroid injection today.  Continue Voltaren gel.  Recheck as needed.   PDMP not reviewed this encounter. Orders Placed This Encounter  Procedures   [place] LIMITED JOINT SPACE STRUCTURES UP BILAT(NO LINKED CHARGES)    Standing  Status:   Future    Number of Occurrences:   1    Standing Expiration Date:   01/23/2022    Order Specific Question:   Reason for Exam (SYMPTOM  OR DIAGNOSIS REQUIRED)    Answer:   finger pain    Order Specific Question:   Preferred imaging location?    Answer:   [facility]   [place] LIMITED JOINT SPACE STRUCTURES UP RIGHT(NO LINKED CHARGES)    Order Specific Question:   Reason for Exam (SYMPTOM  OR DIAGNOSIS REQUIRED)    Answer:   inj    Order Specific Question:   Preferred imaging location?    Answer:   [place]   No orders of the defined types were placed in this encounter.    Discussed warning signs or symptoms. Please see discharge instructions. Patient expresses understanding.   The above documentation has been reviewed and is accurate and complete [name], M.D.

## 2021-07-25 ENCOUNTER — Telehealth: Payer: Self-pay | Admitting: Family Medicine

## 2021-07-25 NOTE — Telephone Encounter (Signed)
Patient called stating that she was seen on Tuesday by Dr Georgina Snell. She had an injection in her finger but she is still not able to bend it and it is still painful. She has also been putting the "cream" on it that was suggested. She asked what else she can do?  Please advise.

## 2021-07-26 NOTE — Telephone Encounter (Signed)
Lets give it a week and see how it does.

## 2021-07-30 NOTE — Telephone Encounter (Signed)
Called pt and her finger is feeling better by giving the injection a bit more time to take effect.

## 2021-08-06 ENCOUNTER — Encounter (HOSPITAL_COMMUNITY): Payer: Self-pay

## 2021-08-06 ENCOUNTER — Other Ambulatory Visit: Payer: Self-pay

## 2021-08-06 ENCOUNTER — Ambulatory Visit (HOSPITAL_COMMUNITY)
Admission: EM | Admit: 2021-08-06 | Discharge: 2021-08-06 | Disposition: A | Discharge status: Home / Self Care | Payer: Medicare HMO | Attending: Emergency Medicine | Admitting: Emergency Medicine

## 2021-08-06 ENCOUNTER — Ambulatory Visit (INDEPENDENT_AMBULATORY_CARE_PROVIDER_SITE_OTHER): Payer: Medicare HMO

## 2021-08-06 DIAGNOSIS — Z833 Family history of diabetes mellitus: Secondary | ICD-10-CM | POA: Diagnosis not present

## 2021-08-06 DIAGNOSIS — Z79899 Other long term (current) drug therapy: Secondary | ICD-10-CM | POA: Diagnosis not present

## 2021-08-06 DIAGNOSIS — I700 Atherosclerosis of aorta: Secondary | ICD-10-CM | POA: Diagnosis not present

## 2021-08-06 DIAGNOSIS — Z7982 Long term (current) use of aspirin: Secondary | ICD-10-CM | POA: Insufficient documentation

## 2021-08-06 DIAGNOSIS — R0781 Pleurodynia: Secondary | ICD-10-CM | POA: Diagnosis not present

## 2021-08-06 DIAGNOSIS — R109 Unspecified abdominal pain: Secondary | ICD-10-CM

## 2021-08-06 DIAGNOSIS — Z87891 Personal history of nicotine dependence: Secondary | ICD-10-CM | POA: Insufficient documentation

## 2021-08-06 DIAGNOSIS — E119 Type 2 diabetes mellitus without complications: Secondary | ICD-10-CM | POA: Insufficient documentation

## 2021-08-06 DIAGNOSIS — R42 Dizziness and giddiness: Secondary | ICD-10-CM | POA: Diagnosis not present

## 2021-08-06 DIAGNOSIS — Z20822 Contact with and (suspected) exposure to covid-19: Secondary | ICD-10-CM | POA: Diagnosis not present

## 2021-08-06 DIAGNOSIS — Z880 Allergy status to penicillin: Secondary | ICD-10-CM | POA: Insufficient documentation

## 2021-08-06 DIAGNOSIS — Z885 Allergy status to narcotic agent status: Secondary | ICD-10-CM | POA: Insufficient documentation

## 2021-08-06 LAB — CBC
HCT: 37.2 % (ref 36.0–46.0)
Hemoglobin: 11.7 g/dL — ABNORMAL LOW (ref 12.0–15.0)
MCH: 28 pg (ref 26.0–34.0)
MCHC: 31.5 g/dL (ref 30.0–36.0)
MCV: 89 fL (ref 80.0–100.0)
Platelets: 268 10*3/uL (ref 150–400)
RBC: 4.18 MIL/uL (ref 3.87–5.11)
RDW: 15 % (ref 11.5–15.5)
WBC: 8.5 10*3/uL (ref 4.0–10.5)
nRBC: 0 % (ref 0.0–0.2)

## 2021-08-06 LAB — SARS CORONAVIRUS 2 (TAT 6-24 HRS): SARS Coronavirus 2: NEGATIVE

## 2021-08-06 NOTE — Discharge Instructions (Addendum)
You need to go to ER if any chest pain or shortness of breath  Dizziness can come from several things such as blood pressure or dehydration  Check my chart for results  I will call you with lab results

## 2021-08-06 NOTE — ED Triage Notes (Signed)
Pt presents with RUQ abdominal pain and states she felt dizzy when she got out of her bed this morning.   Pt denies headaches, back pain and chest pain. States she has had night sweats.Pt denies SOB.

## 2021-08-06 NOTE — ED Provider Notes (Signed)
[place]    CSN: [id number] Arrival date & time: 08/06/21  0856      History   Chief Complaint Chief Complaint  Patient presents with   Dizziness   Abdominal Pain    HPI Krista Rosales is a 87 y.o. female.   Pt is here for dizziness and lt side rib pain. Pt states that she is very active walks 6 miles a day. Has controlled DM with diet. Checked her cbg it was 100. She started to have rib pain and wanted to come in to have a covid test done. She does have HP and checked pervious 132/86 states bp is always elevated when she goes to MD. Has had anemia in the past but has not had this checked. Denies any abd pain, no n/v/d, no sob, no chest pain. Alert x4,    Past Medical History:  Diagnosis Date   ANEMIA, IRON DEFICIENCY 05/07/2009   Qualifier: Diagnosis of  By: Loanne Drilling MD, Sean A    ARTHRITIS 05/30/2008   Qualifier: Diagnosis of  By: Smith NCMA, [place], TYPE II 07/16/2007   Qualifier: Diagnosis of  By: Marca Ancona RMA, Lucy     Diastolic congestive heart failure ([place]) 09/2017   DIVERTICULOSIS, COLON 05/30/2008   Qualifier: Diagnosis of  By: Marland Mcalpine     Dyspnea    Glaucoma    HYPERLIPIDEMIA 07/16/2007   Qualifier: Diagnosis of  By: Marca Ancona RMA, Lucy     Hypertension    Normocytic anemia     Patient Active Problem List   Diagnosis Date Noted   Chest pain 40/98/1191   Diastolic congestive heart failure ([place]) 09/30/2017   Headache    Hypertensive urgency    Hypokalemia    Bradycardia    Normocytic anemia    Chronic diastolic CHF (congestive heart failure) ([place])    Other emphysema ([place])    Glaucoma    Hypertension 02/20/2015   Malignant hypertension 02/20/2015   ANEMIA, IRON DEFICIENCY 05/07/2009   DYSPNEA 05/07/2009   CONSTIPATION 06/01/2008   HEMORRHOIDS 05/30/2008   DIVERTICULOSIS, COLON 05/30/2008   ARTHRITIS 05/30/2008   COUGH 02/03/2008   Diabetes mellitus type 2 with complications ([place]) 47/82/9562   HYPERLIPIDEMIA  07/16/2007   Essential hypertension 07/16/2007   Coronary atherosclerosis 07/16/2007   ALLERGIC RHINITIS 07/16/2007   OSTEOPOROSIS 07/16/2007    Past Surgical History:  Procedure Laterality Date   ABDOMINAL HYSTERECTOMY      OB History   No obstetric history on file.      Home Medications    Prior to Admission medications   Medication Sig Start Date End Date Taking? Authorizing Provider  ALPRAZolam Duanne Moron) 0.5 MG tablet Take 0.25 mg by mouth 2 (two) times daily. 09/07/17   [provider]  amLODipine (NORVASC) 10 MG tablet Take 1 tablet (10 mg total) by mouth daily. 03/15/21   Lelon Perla, MD  aspirin EC 81 MG tablet Take 81 mg by mouth daily. Swallow whole.    [provider]  cholecalciferol (VITAMIN D) 1000 UNITS tablet Take 2,000 Units by mouth daily.     [provider]  diclofenac Sodium (VOLTAREN) 1 % GEL Apply 4 g topically 4 (four) times daily. To affected joint. 06/27/20   Gregor Hams, MD  doxazosin (CARDURA) 2 MG tablet TAKE 1 TABLET(2 MG) BY MOUTH DAILY 03/15/21   Lelon Perla, MD  hydrALAZINE (APRESOLINE) 100 MG tablet Take 1 tablet (100 mg total) by  mouth 3 (three) times daily. 03/15/21   Lelon Perla, MD  Lancets [facility] DELICA PLUS [id number]) MISC Apply 1 each topically 3 (three) times daily. 07/18/20   [provider]  magnesium hydroxide (MILK OF MAGNESIA) 400 MG/5ML suspension Take 15 mLs by mouth daily as needed for mild constipation.    [provider]  [facility] VERIO test strip 1 each 3 (three) times daily. 07/17/20   [provider]  furosemide (LASIX) 20 MG tablet One tablet once daily as needed for swelling Patient taking differently: Take 20 mg by mouth See admin instructions. Takes 1 tablet Tuesday,Thursday, Saturday and Sunday and 2 tablets on Monday, Wednesday and Friday. 11/15/18 03/11/21  Lelon Perla, MD  loratadine (CLARITIN) 5 MG/5ML syrup Take 5 mLs (5 mg total) by mouth daily.  04/18/19 03/11/21  Wieters, Hallie C, PA-C  omeprazole (PRILOSEC) 40 MG capsule Take 40 mg by mouth daily. 09/12/20 03/11/21  [provider]    Family History Family History  Problem Relation Age of Onset   Diabetes Mellitus II Other    Pancreatic cancer Mother    Heart disease Father    CAD Brother     Social History Social History   Tobacco Use   Smoking status: Former   Smokeless tobacco: Never  Scientific laboratory technician Use: Never used  Substance Use Topics   Alcohol use: No   Drug use: No     Allergies   Wellbutrin [bupropion], Codeine, and Penicillins   Review of Systems Review of Systems  Constitutional: Negative.   HENT: Negative.    Eyes: Negative.   Respiratory:  Negative for cough and shortness of breath.        Lt rib pain when taking a deep breath   Genitourinary: Negative.   Skin:  Positive for pallor.  Neurological:  Positive for dizziness. Negative for syncope, speech difficulty, weakness, light-headedness, numbness and headaches.    Physical Exam Triage Vital Signs ED Triage Vitals  Enc Vitals Group     BP 08/06/21 0918 (!) 164/69     Pulse Rate 08/06/21 0918 68     Resp 08/06/21 0918 15     Temp 08/06/21 0918 98.5 F (36.9 C)     Temp Source 08/06/21 0918 Oral     SpO2 08/06/21 0918 100 %     Weight --      Height --      Head Circumference --      Peak Flow --      Pain Score 08/06/21 0917 0     Pain Loc --      Pain Edu? --      Excl. in [place]? --    No data found.  Updated Vital Signs BP (!) 164/69 (BP Location: Right Arm)   Pulse 68   Temp 98.5 F (36.9 C) (Oral)   Resp 15   SpO2 100%   Visual Acuity Right Eye Distance:   Left Eye Distance:   Bilateral Distance:    Right Eye Near:   Left Eye Near:    Bilateral Near:     Physical Exam Constitutional:      Appearance: She is well-developed.  Cardiovascular:     Rate and Rhythm: Normal rate.  Abdominal:     General: Abdomen is flat. Bowel sounds are normal.      Palpations: Abdomen is soft.  Neurological:     Mental Status: She is alert.     UC Treatments /  Results  Labs (all labs ordered are listed, but only abnormal results are displayed) Labs Reviewed  CBC - Abnormal; Notable for the following components:      Result Value   Hemoglobin 11.7 (*)    All other components within normal limits  SARS CORONAVIRUS 2 (TAT 6-24 HRS)    EKG   Radiology DG Chest 2 View  Result Date: 08/06/2021 CLINICAL DATA:  Right rib pain. EXAM: CHEST - 2 VIEW COMPARISON:  April 17, 2020. FINDINGS: Stable cardiomediastinal silhouette. Minimal bibasilar subsegmental atelectasis or scarring is noted. The visualized skeletal structures are unremarkable. IMPRESSION: Minimal bibasilar subsegmental atelectasis or scarring is noted. Aortic Atherosclerosis (ICD10-I70.0). Electronically Signed   By: Marijo Conception M.D.   On: 08/06/2021 10:34    Procedures Procedures (including critical care time)  Medications Ordered in UC Medications - No data to display  Initial Impression / Assessment and Plan / UC Course  I have reviewed the triage vital signs and the nursing notes.  Pertinent labs & imaging results that were available during my care of the patient were reviewed by me and considered in my medical decision making (see chart for details).     Pt states that she can not wait for results that she is feeling better  I expressed that her x ray was normal covid test will take upto 3 days to return.  I will call pt with lab results when they return  Stay hydrated and if she has chest pain or sob to go to the ER  Called pt and LVM per pt about lab values normal and she needs to follow up with her pcp  Final Clinical Impressions(s) / UC Diagnoses   Final diagnoses:  Dizziness  Rib pain     Discharge Instructions      You need to go to ER if any chest pain or shortness of breath  Dizziness can come from several things such as blood pressure or dehydration   Check my chart for results  I will call you with lab results       ED Prescriptions   None    PDMP not reviewed this encounter.   Marney Setting, NP 08/06/21 [phone]

## 2021-08-14 ENCOUNTER — Other Ambulatory Visit: Payer: Self-pay

## 2021-08-14 ENCOUNTER — Ambulatory Visit (INDEPENDENT_AMBULATORY_CARE_PROVIDER_SITE_OTHER): Payer: Medicare HMO

## 2021-08-14 ENCOUNTER — Encounter: Payer: Self-pay | Admitting: Family Medicine

## 2021-08-14 ENCOUNTER — Ambulatory Visit: Payer: Medicare HMO | Admitting: Family Medicine

## 2021-08-14 ENCOUNTER — Ambulatory Visit: Payer: Self-pay

## 2021-08-14 VITALS — BP 132/82 | HR 75 | Ht 69.0 in | Wt 149.0 lb

## 2021-08-14 DIAGNOSIS — M25531 Pain in right wrist: Secondary | ICD-10-CM

## 2021-08-14 NOTE — Progress Notes (Signed)
I, Krista Rosales, LAT, ATC acting as a scribe for Krista Leader, MD.  Krista Rosales is a 87 y.o. female who presents to [place] at [facility] today for f/u of hand and finger pain. Pt was last seen by Dr. Georgina Snell on 07/23/21 and was given steroid injections in her L 3rd  PIP joint and R 2nd PIP joint and was advised to cont using Voltaren gel. Today, pt reports on Saturday, she was unfolding frozen [place] wings trying to pull them about and it hurt her R wrist. Pt locates pain to all over carpals and in the R 1st finger.  Dx imaging: 06/07/21 R hand XR  Pertinent review of systems: No fevers or chills  Relevant historical information: Hypertension and diabetes, heart failure   Exam:  BP 132/82   Pulse 75   Ht 5\' 9"  (1.753 m)   Wt 149 lb (67.6 kg)   SpO2 95%   BMI 22.00 kg/m  General: Well Developed, well nourished, and in no acute distress.   MSK: Right wrist large effusion.  Tender palpation dorsal and radial and volar wrist. Decreased wrist motion. Decree strength with pain. Pulses cap refill and sensation are intact distally.    Lab and Radiology Results Procedure: Real-time Ultrasound Guided Injection of right dorsal wrist Device: Philips Affiniti 50G Images permanently stored and available for review in PACS Ultrasound evaluation prior to injection reveals intact tendon structures.  Moderate wrist effusion.  Bony structures intact Verbal informed consent obtained.  Discussed risks and benefits of procedure. Warned about infection bleeding damage to structures skin hypopigmentation and fat atrophy among others. Patient expresses understanding and agreement Time-out conducted.   Noted no overlying erythema, induration, or other signs of local infection.   Skin prepped in a sterile fashion.   Local anesthesia: Topical Ethyl chloride.   With sterile technique and under real time ultrasound guidance: 40 mg of Kenalog and 2 mL of Marcaine injected into  dorsal wrist joint. Fluid seen entering the joint capsule.   Completed without difficulty   Pain immediately resolved suggesting accurate placement of the medication.   Advised to call if fevers/chills, erythema, induration, drainage, or persistent bleeding.   Images permanently stored and available for review in the ultrasound unit.  Impression: Technically successful ultrasound guided injection.    X-ray images right wrist obtained today personally and independently interpreted. No acute fractures.  Degenerative changes present throughout the wrist. Await formal radiology  Custom fiberglass thumb spica splint created and fitted in clinic today.   Assessment and Plan: 87 y.o. female with right wrist pain and effusion.  This occurred somewhat suddenly.  Await x-ray over read from radiology however per my read no fractures are visible.  Plan for wrist injection and thumb spica splint made in clinic today.  Recheck in 3 weeks.  Return sooner if needed.   PDMP not reviewed this encounter. Orders Placed This Encounter  Procedures   [place] LIMITED JOINT SPACE STRUCTURES UP RIGHT(NO LINKED CHARGES)    Order Specific Question:   Reason for Exam (SYMPTOM  OR DIAGNOSIS REQUIRED)    Answer:   eval rt wrist    Order Specific Question:   Preferred imaging location?    Answer:   [place]   DG Wrist Complete Right    Standing Status:   Future    Number of Occurrences:   1    Standing Expiration Date:   08/14/2022    Order Specific Question:  Reason for Exam (SYMPTOM  OR DIAGNOSIS REQUIRED)    Answer:   eval wrist brace    Order Specific Question:   Preferred imaging location?    Answer:   [facility]   No orders of the defined types were placed in this encounter.    Discussed warning signs or symptoms. Please see discharge instructions. Patient expresses understanding.   The above documentation has been reviewed and is accurate and complete Krista Rosales,  M.D.

## 2021-08-14 NOTE — Patient Instructions (Addendum)
Thank you for coming in today.   Please get an Xray today before you leave   You received an injection today. Seek immediate medical attention if the joint becomes red, extremely painful, or is oozing fluid.   Use the splint as needed.   Ok to take it off if you want to.    Recheck with me in 3 weeks.

## 2021-08-15 ENCOUNTER — Telehealth: Payer: Self-pay | Admitting: Podiatry

## 2021-08-15 NOTE — Telephone Encounter (Signed)
Diabetic shoes/inserts in..lvm for pt to call to schedule an appt to pick them up. 

## 2021-08-16 ENCOUNTER — Ambulatory Visit (INDEPENDENT_AMBULATORY_CARE_PROVIDER_SITE_OTHER): Payer: Medicare HMO

## 2021-08-16 ENCOUNTER — Other Ambulatory Visit: Payer: Self-pay

## 2021-08-16 DIAGNOSIS — M2011 Hallux valgus (acquired), right foot: Secondary | ICD-10-CM | POA: Diagnosis not present

## 2021-08-16 DIAGNOSIS — M2012 Hallux valgus (acquired), left foot: Secondary | ICD-10-CM | POA: Diagnosis not present

## 2021-08-16 DIAGNOSIS — E1140 Type 2 diabetes mellitus with diabetic neuropathy, unspecified: Secondary | ICD-10-CM | POA: Diagnosis not present

## 2021-08-16 DIAGNOSIS — M2041 Other hammer toe(s) (acquired), right foot: Secondary | ICD-10-CM | POA: Diagnosis not present

## 2021-08-16 DIAGNOSIS — E1149 Type 2 diabetes mellitus with other diabetic neurological complication: Secondary | ICD-10-CM

## 2021-08-16 DIAGNOSIS — M2042 Other hammer toe(s) (acquired), left foot: Secondary | ICD-10-CM | POA: Diagnosis not present

## 2021-08-16 NOTE — Progress Notes (Signed)
Patient in office today to pick-up diabetic shoes. Shoes were tried on with custom inserts and patient was satisfied with the fit and feel of the shoes. Patient was educated on the break-in process and return policy. Patient verbalized understanding. Advised patient to call the office with any questions, comments or concerns.

## 2021-08-16 NOTE — Progress Notes (Signed)
Wrist x-ray shows some arthritis.  No fractures are present.

## 2021-09-03 NOTE — Progress Notes (Deleted)
   I, Krista Rosales, LAT, ATC, am serving as scribe for Dr. Lynne Rosales.  Krista Rosales is a 87 y.o. female who presents to [place] at [facility] today for f/u of R wrist pain.  She was last seen by Dr. Georgina Rosales on 08/14/21 and had a R dorsal wrist steroid injection.  She was also provided w/ a fabricated thumb spica splint.  Today, pt reports  Diagnostic testing: R wrist XR- 08/14/21; R hand XR- 06/07/21   Pertinent review of systems: ***  Relevant historical information: ***   Exam:  There were no vitals taken for this visit. General: Well Developed, well nourished, and in no acute distress.   MSK: ***    Lab and Radiology Results No results found for this or any previous visit (from the past 72 hour(s)). No results found.     Assessment and Plan: 87 y.o. female with ***   PDMP not reviewed this encounter. No orders of the defined types were placed in this encounter.  No orders of the defined types were placed in this encounter.    Discussed warning signs or symptoms. Please see discharge instructions. Patient expresses understanding.   ***

## 2021-09-04 ENCOUNTER — Ambulatory Visit: Payer: Medicare HMO | Admitting: Family Medicine

## 2021-09-04 ENCOUNTER — Ambulatory Visit: Payer: Medicare HMO | Admitting: Sports Medicine

## 2021-09-09 DIAGNOSIS — L97512 Non-pressure chronic ulcer of other part of right foot with fat layer exposed: Secondary | ICD-10-CM | POA: Diagnosis not present

## 2021-09-09 DIAGNOSIS — Z79899 Other long term (current) drug therapy: Secondary | ICD-10-CM | POA: Diagnosis not present

## 2021-09-09 DIAGNOSIS — E119 Type 2 diabetes mellitus without complications: Secondary | ICD-10-CM | POA: Diagnosis not present

## 2021-09-09 DIAGNOSIS — M21621 Bunionette of right foot: Secondary | ICD-10-CM | POA: Diagnosis not present

## 2021-09-10 ENCOUNTER — Ambulatory Visit: Payer: Medicare HMO | Admitting: Cardiology

## 2021-09-10 NOTE — Progress Notes (Signed)
HPI: FU bradycardia and hypertension. Nuclear study January 2019 showed ejection fraction 46% and no ischemia or infarction. Ejection fraction felt better than calculated number. Echocardiogram in January 2020 showed normal LV function, moderate left ventricular hypertrophy, mild diastolic dysfunction, mild mitral regurgitation. Monitor August 2020 showed sinus bradycardia with first-degree AV block, normal sinus rhythm, sinus tachycardia, intermittent Mobitz 1 second-degree AV block, multiple pauses with longest being 3.4 seconds, occasional PAC and PVC. Clonidine patch weaned to off.  Since last seen patient has dyspnea with more vigorous activities.  She denies orthopnea PND, pedal edema, chest pain or syncope.  Current Outpatient Medications  Medication Sig Dispense Refill   ALPRAZolam (XANAX) 0.5 MG tablet Take 0.25 mg by mouth 2 (two) times daily.  0   amLODipine (NORVASC) 10 MG tablet Take 1 tablet (10 mg total) by mouth daily. 90 tablet 3   aspirin EC 81 MG tablet Take 81 mg by mouth daily. Swallow whole.     Blood Glucose Monitoring Suppl (TRUE METRIX METER) w/Device KIT      cholecalciferol (VITAMIN D) 1000 UNITS tablet Take 2,000 Units by mouth daily.      diclofenac Sodium (VOLTAREN) 1 % GEL Apply 4 g topically 4 (four) times daily. To affected joint. 400 g 11   doxazosin (CARDURA) 2 MG tablet TAKE 1 TABLET(2 MG) BY MOUTH DAILY 90 tablet 3   hydrALAZINE (APRESOLINE) 100 MG tablet Take 1 tablet (100 mg total) by mouth 3 (three) times daily. 270 tablet 3   Lancets (ONETOUCH DELICA PLUS [id number]) MISC Apply 1 each topically 3 (three) times daily.     ONETOUCH VERIO test strip 1 each 3 (three) times daily.     magnesium hydroxide (MILK OF MAGNESIA) 400 MG/5ML suspension Take 15 mLs by mouth daily as needed for mild constipation. (Patient not taking: Reported on 09/13/2021)     No current facility-administered medications for this visit.     Past Medical History:  Diagnosis Date    ANEMIA, IRON DEFICIENCY 05/07/2009   Qualifier: Diagnosis of  By: Loanne Drilling MD, Sean A    ARTHRITIS 05/30/2008   Qualifier: Diagnosis of  By: Smith NCMA, [place], TYPE II 07/16/2007   Qualifier: Diagnosis of  By: Marca Ancona RMA, Lucy     Diastolic congestive heart failure ([place]) 09/2017   DIVERTICULOSIS, COLON 05/30/2008   Qualifier: Diagnosis of  By: Marland Mcalpine     Dyspnea    Glaucoma    HYPERLIPIDEMIA 07/16/2007   Qualifier: Diagnosis of  By: Marca Ancona RMA, Lucy     Hypertension    Normocytic anemia     Past Surgical History:  Procedure Laterality Date   ABDOMINAL HYSTERECTOMY      Social History   Socioeconomic History   Marital status: Widowed    Spouse name: Not on file   Number of children: Not on file   Years of education: Not on file   Highest education level: Not on file  Occupational History   Not on file  Tobacco Use   Smoking status: Former   Smokeless tobacco: Never  Vaping Use   Vaping Use: Never used  Substance and Sexual Activity   Alcohol use: No   Drug use: No   Sexual activity: Never  Other Topics Concern   Not on file  Social History Narrative   Widowed.  No children.  Has a brother.     Social Determinants of Radio broadcast assistant  Strain: Not on file  Food Insecurity: Not on file  Transportation Needs: Not on file  Physical Activity: Not on file  Stress: Not on file  Social Connections: Not on file  Intimate Partner Violence: Not on file    Family History  Problem Relation Age of Onset   Diabetes Mellitus II Other    Pancreatic cancer Mother    Heart disease Father    CAD Brother     ROS: no fevers or chills, productive cough, hemoptysis, dysphasia, odynophagia, melena, hematochezia, dysuria, hematuria, rash, seizure activity, orthopnea, PND, pedal edema, claudication. Remaining systems are negative.  Physical Exam: Well-developed well-nourished in no acute distress.  Skin is warm and dry.  HEENT is  normal.  Neck is supple.  Chest is clear to auscultation with normal expansion.  Cardiovascular exam is regular rate and rhythm.  2/6 systolic murmur left sternal border.  S2 is not diminished. Abdominal exam nontender or distended. No masses palpated. Extremities show no edema. neuro grossly intact  A/P  1 Bradycardia-patient continues to be asymptomatic.  Continue to avoid AV nodal blocking agents as she has had Mobitz 1 second-degree AV block in the past.  May require pacemaker in the future.  2 hypertension-blood pressure controlled.  Continue present medications and follow.  3 chronic diastolic congestive heart failure-euvolemic on examination.  Presently on no diuretics.  Will follow.  4 mitral regurgitation-mild on most recent echocardiogram.  5 chronic stage III kidney disease-Per nephrology.  Kirk Ruths, MD

## 2021-09-13 ENCOUNTER — Ambulatory Visit: Payer: Medicare HMO | Admitting: Cardiology

## 2021-09-13 ENCOUNTER — Other Ambulatory Visit: Payer: Self-pay

## 2021-09-13 ENCOUNTER — Encounter: Payer: Self-pay | Admitting: Cardiology

## 2021-09-13 VITALS — BP 144/50 | HR 62 | Ht 69.6 in | Wt 149.5 lb

## 2021-09-13 DIAGNOSIS — I5032 Chronic diastolic (congestive) heart failure: Secondary | ICD-10-CM

## 2021-09-13 DIAGNOSIS — R001 Bradycardia, unspecified: Secondary | ICD-10-CM

## 2021-09-13 DIAGNOSIS — I10 Essential (primary) hypertension: Secondary | ICD-10-CM

## 2021-09-13 NOTE — Patient Instructions (Signed)

## 2021-09-16 DIAGNOSIS — L97512 Non-pressure chronic ulcer of other part of right foot with fat layer exposed: Secondary | ICD-10-CM | POA: Diagnosis not present

## 2021-09-25 NOTE — Progress Notes (Signed)
I, [name], LAT, ATC, am serving as scribe for Dr. [name].  Krista Rosales is a 87 y.o. female who presents to [place] at [facility] today for f/u of R wrist pain.  She was last seen by Dr. Georgina Snell on 08/14/21 and had a R wrist injection and was placed in a fiberglass splint.  Today, pt reports that her R wrist flared up over the last few days.  She is having difficulty w/ R wrist AROM and states that she "can't lift anything."  Diagnostic imaging: R wrist XR- 08/14/21; R hand XR- 06/07/21   Pertinent review of systems: No fevers or chills  Relevant historical information: No known history of gout.  CKD 3.  Recent foot surgery.   Exam:  BP (!) 110/50 (BP Location: Left Arm, Patient Position: Sitting, Cuff Size: Normal)   Pulse 60   Ht 5' 9.6" (1.768 m)   Wt 152 lb 12.8 oz (69.3 kg)   SpO2 94%   BMI 22.18 kg/m  General: Well Developed, well nourished, and in no acute distress.   MSK: Right wrist moderate effusion.  Decreased range of motion.  Tender palpation dorsal wrist.  Intact grip strength.  Pulses cap refill and sensation are intact distally.    Lab and Radiology Results  Procedure: Real-time Ultrasound Guided Injection of right wrist dorsal radial joint Device: Philips Affiniti 50G Images permanently stored and available for review in PACS Verbal informed consent obtained.  Discussed risks and benefits of procedure. Warned about infection bleeding damage to structures skin hypopigmentation and fat atrophy among others. Patient expresses understanding and agreement Time-out conducted.   Noted no overlying erythema, induration, or other signs of local infection.   Skin prepped in a sterile fashion.   Local anesthesia: Topical Ethyl chloride.   With sterile technique and under real time ultrasound guidance: 40 mg of Kenalog and 2 mL of Marcaine injected into the wrist joint. Fluid seen entering the joint capsule.   Completed without difficulty    Pain immediately resolved suggesting accurate placement of the medication.   Advised to call if fevers/chills, erythema, induration, drainage, or persistent bleeding.   Images permanently stored and available for review in the ultrasound unit.  Impression: Technically successful ultrasound guided injection.   EXAM: RIGHT WRIST - COMPLETE 3+ VIEW   COMPARISON:  Right hand radiographs 06/07/2009   FINDINGS: There is no acute fracture or dislocation. Alignment is normal. There is radiocarpal joint space narrowing with associated subchondral sclerosis. There is mild thumb and index finger CMC joint space narrowing. The remaining joint spaces appear preserved.   Extensive vascular calcifications are noted. The soft tissues are otherwise unremarkable.   IMPRESSION: Mild degenerative changes about the wrist as above. No acute findings.     Electronically Signed   By: Valetta Mole M.D.   On: 08/15/2021 09:43   I, [name], personally (independently) visualized and performed the interpretation of the images attached in this note.       Assessment and Plan: 87 y.o. female with right wrist pain and effusion.  This is becoming a recurrent issue for [facility]  She has steroid injection about 6 weeks ago which did not last very long.  X-ray did not show anything more than mild DJD.  At this point the etiology of her wrist pain and effusion is not clear.  I suspect gout we will obtain uric acid and metabolic panel to look for gout.  If this is abnormal we  will treat for gout if not abnormal next step would be MRI of the wrist to evaluate cause of pain. [name] agrees with the plan.   PDMP not reviewed this encounter. Orders Placed This Encounter  Procedures   [place] LIMITED JOINT SPACE STRUCTURES UP RIGHT(NO LINKED CHARGES)    Order Specific Question:   Reason for Exam (SYMPTOM  OR DIAGNOSIS REQUIRED)    Answer:   R wrist pain    Order Specific Question:   Preferred imaging location?     Answer:   [facility] Specific Question:   Has the patient fasted?    Answer:   No   Uric acid   No orders of the defined types were placed in this encounter.    Discussed warning signs or symptoms. Please see discharge instructions. Patient expresses understanding.   The above documentation has been reviewed and is accurate and complete [name], M.D.

## 2021-09-26 ENCOUNTER — Ambulatory Visit: Payer: Self-pay

## 2021-09-26 ENCOUNTER — Ambulatory Visit: Payer: Medicare HMO | Admitting: Family Medicine

## 2021-09-26 ENCOUNTER — Encounter: Payer: Self-pay | Admitting: Family Medicine

## 2021-09-26 ENCOUNTER — Other Ambulatory Visit: Payer: Self-pay

## 2021-09-26 VITALS — BP 110/50 | HR 60 | Ht 69.6 in | Wt 152.8 lb

## 2021-09-26 DIAGNOSIS — M109 Gout, unspecified: Secondary | ICD-10-CM | POA: Diagnosis not present

## 2021-09-26 DIAGNOSIS — M2550 Pain in unspecified joint: Secondary | ICD-10-CM

## 2021-09-26 DIAGNOSIS — M25531 Pain in right wrist: Secondary | ICD-10-CM

## 2021-09-26 LAB — URIC ACID: Uric Acid, Serum: 10.4 mg/dL — ABNORMAL HIGH (ref 2.4–7.0)

## 2021-09-26 LAB — BASIC METABOLIC PANEL
BUN: 33 mg/dL — ABNORMAL HIGH (ref 6–23)
CO2: 21 mEq/L (ref 19–32)
Calcium: 8.9 mg/dL (ref 8.4–10.5)
Chloride: 106 mEq/L (ref 96–112)
Creatinine, Ser: 1.95 mg/dL — ABNORMAL HIGH (ref 0.40–1.20)
GFR: 23.1 mL/min — ABNORMAL LOW (ref >60.00)
Glucose, Bld: 228 mg/dL — ABNORMAL HIGH (ref 70–99)
Potassium: 4.5 mEq/L (ref 3.5–5.1)
Sodium: 137 mEq/L (ref 135–145)

## 2021-09-26 NOTE — Patient Instructions (Addendum)
Good to see you today.  You had  a R wrist injection.  Call or go to the ER if you develop a large red swollen joint with extreme pain or oozing puss.   Please get labs today before you leave.  If labs show gout, I'll treat for gout and if they don't show gout, I'll order an MRI.  Follow-up as needed.

## 2021-09-30 DIAGNOSIS — L97512 Non-pressure chronic ulcer of other part of right foot with fat layer exposed: Secondary | ICD-10-CM | POA: Diagnosis not present

## 2021-10-01 MED ORDER — ALLOPURINOL 100 MG PO TABS: 50.0000 mg | ORAL_TABLET | ORAL | 1 refills | Status: DC

## 2021-10-01 NOTE — Addendum Note (Signed)
Addended by: Gregor Hams on: 10/01/2021 06:55 AM   Modules accepted: Orders

## 2021-10-01 NOTE — Progress Notes (Signed)
Uric acid is significantly elevated to 10.4.  This indicates that your wrist pain is probably coming from gout.  Your kidney function is slowly worsening over the last year.  This may be contributing to the gout as well.  I have added a medicine called allopurinol which is used to lower uric acid and treat gout.  However because of your kidney function we need to use a very low dose.  Please take one half of a pill of allopurinol every other day.  Recheck in 1 month.

## 2021-10-14 DIAGNOSIS — L97512 Non-pressure chronic ulcer of other part of right foot with fat layer exposed: Secondary | ICD-10-CM | POA: Diagnosis not present

## 2021-10-15 NOTE — Progress Notes (Signed)
Office Note    CC: Concern for peripheral arterial disease Requesting Provider:  Josetta Huddle, MD  HPI: Krista Rosales is a 87 y.o. ([birth year]) female presenting at the request of .Josetta Huddle, MD with concern for peripheral arterial disease.  She is followed by Dr. Jacqualyn Posey, with podiatry, and was recently fitted for new diabetic shoes.  Being fitted, he appreciated the wound the lateral aspect of the right foot.  Per [place], this has been there for over a month and is failed to heal.    Rose remains independent, but her ambulation has been limited by trying to be nonweightbearing on the right foot.  She denies fevers, chills, erythema, induration.  She has no pain the ulceration.  Longstanding diabetes.  The pt is not on a statin for cholesterol management.  The pt is  on a daily aspirin.   Other AC:  - The pt is  on medication for hypertension.   The pt is is diabetic.  Tobacco hx:  -  Past Medical History:  Diagnosis Date   ANEMIA, IRON DEFICIENCY 05/07/2009   Qualifier: Diagnosis of  By: Loanne Drilling MD, Sean A    ARTHRITIS 05/30/2008   Qualifier: Diagnosis of  By: Smith NCMA, [place], TYPE II 07/16/2007   Qualifier: Diagnosis of  By: Marca Ancona RMA, Lucy     Diastolic congestive heart failure ([place]) 09/2017   DIVERTICULOSIS, COLON 05/30/2008   Qualifier: Diagnosis of  By: Marland Mcalpine     Dyspnea    Glaucoma    HYPERLIPIDEMIA 07/16/2007   Qualifier: Diagnosis of  By: Marca Ancona RMA, Lucy     Hypertension    Normocytic anemia     Past Surgical History:  Procedure Laterality Date   ABDOMINAL HYSTERECTOMY      Social History   Socioeconomic History   Marital status: Widowed    Spouse name: Not on file   Number of children: Not on file   Years of education: Not on file   Highest education level: Not on file  Occupational History   Not on file  Tobacco Use   Smoking status: Former   Smokeless tobacco: Never  Vaping Use   Vaping Use: Never used   Substance and Sexual Activity   Alcohol use: No   Drug use: No   Sexual activity: Never  Other Topics Concern   Not on file  Social History Narrative   Widowed.  No children.  Has a brother.     Social Determinants of Health   Financial Resource Strain: Not on file  Food Insecurity: Not on file  Transportation Needs: Not on file  Physical Activity: Not on file  Stress: Not on file  Social Connections: Not on file  Intimate Partner Violence: Not on file    Family History  Problem Relation Age of Onset   Diabetes Mellitus II Other    Pancreatic cancer Mother    Heart disease Father    CAD Brother     Current Outpatient Medications  Medication Sig Dispense Refill   allopurinol (ZYLOPRIM) 100 MG tablet Take 0.5 tablets (50 mg total) by mouth every other day. 15 tablet 1   ALPRAZolam (XANAX) 0.5 MG tablet Take 0.25 mg by mouth 2 (two) times daily.  0   amLODipine (NORVASC) 10 MG tablet Take 1 tablet (10 mg total) by mouth daily. 90 tablet 3   aspirin EC 81 MG tablet Take 81 mg by mouth daily. Swallow whole.  Blood Glucose Monitoring Suppl (TRUE METRIX METER) w/Device KIT      cholecalciferol (VITAMIN D) 1000 UNITS tablet Take 2,000 Units by mouth daily.      diclofenac Sodium (VOLTAREN) 1 % GEL Apply 4 g topically 4 (four) times daily. To affected joint. 400 g 11   doxazosin (CARDURA) 2 MG tablet TAKE 1 TABLET(2 MG) BY MOUTH DAILY 90 tablet 3   hydrALAZINE (APRESOLINE) 100 MG tablet Take 1 tablet (100 mg total) by mouth 3 (three) times daily. 270 tablet 3   Lancets (ONETOUCH DELICA PLUS [id number]) MISC Apply 1 each topically 3 (three) times daily.     magnesium hydroxide (MILK OF MAGNESIA) 400 MG/5ML suspension Take 15 mLs by mouth daily as needed for mild constipation. (Patient not taking: No sig reported)     ONETOUCH VERIO test strip 1 each 3 (three) times daily.     No current facility-administered medications for this visit.    Allergies  Allergen Reactions    Wellbutrin [Bupropion]     unknown   Codeine     hallucinate   Penicillins Rash    Did it involve swelling of the face/tongue/throat, SOB, or low BP? Y Did it involve sudden or severe rash/hives, skin peeling, or any reaction on the inside of your mouth or nose? Y Did you need to seek medical attention at a hospital or doctor's office? Y When did it last happen?  While at hospital    If all above answers are "NO", may proceed with cephalosporin use.      REVIEW OF SYSTEMS:   [X]  denotes positive finding, [ ]  denotes negative finding Cardiac  Comments:  Chest pain or chest pressure:    Shortness of breath upon exertion:    Short of breath when lying flat:    Irregular heart rhythm:        Vascular    Pain in calf, thigh, or hip brought on by ambulation:    Pain in feet at night that wakes you up from your sleep:     Blood clot in your veins:    Leg swelling:         Pulmonary    Oxygen at home:    Productive cough:     Wheezing:         Neurologic    Sudden weakness in arms or legs:     Sudden numbness in arms or legs:     Sudden onset of difficulty speaking or slurred speech:    Temporary loss of vision in one eye:     Problems with dizziness:         Gastrointestinal    Blood in stool:     Vomited blood:         Genitourinary    Burning when urinating:     Blood in urine:        Psychiatric    Major depression:         Hematologic    Bleeding problems:    Problems with blood clotting too easily:        Skin    Rashes or ulcers:        Constitutional    Fever or chills:      PHYSICAL EXAMINATION:  There were no vitals filed for this visit.  General:  WDWN in NAD; vital signs documented above Gait: Not observed HENT: WNL, normocephalic Pulmonary: normal non-labored breathing , without Rales, rhonchi,  wheezing Cardiac: regular HR,  Abdomen: soft,  NT, no masses Skin: without rashes Vascular Exam/Pulses:  Right Left  Radial 2+ (normal) 2+  (normal)  Ulnar 2+ (normal) 2+ (normal)  Femoral 2+ 2+  Popliteal    DP absent absent  PT absent absent   Extremities: with ischemic changes, without Gangrene , without cellulitis; with open wounds; Right lateral foot   Musculoskeletal: no muscle wasting or atrophy  Neurologic: A&O X 3;  No focal weakness or paresthesias are detected Psychiatric:  The pt has Normal affect.   Non-Invasive Vascular Imaging:   Noninvasive vascular imaging was significant reviewed.  Noncompressible ABIs likely due to medial calcinosis from longstanding diabetes.  Monophasic waveforms with poor toe pressure 49 on the right, 73 on the left.    ASSESSMENT/PLAN: LADONNA VANORDER is a 87 y.o. female presenting with nonhealing ulceration on the lateral aspect of her right foot.  Valeska's peripheral arterial disease is defined as Rutherford 5 critical limb ischemia. Without intervention, roses wound will likely not heal and subsequent amputation at undefined level would be needed. Aashritha was adamant she will not receive any amputation.  After discussing the risk and benefits of lower extremity angiogram with both Elza and her daughter (who has excellent background medical knowledge) Hagar elected to proceed.  WIFI Score: Wound: 1, Ischemia 2, Foot infection 0 Estimate risk of amputation at one year without revascularization Moderate  Recommend the following which can slow the progression of atherosclerosis and reduce the risk of major adverse cardiac / limb events:  Aspirin 10m PO QD.  Atorvastatin 40-80mg PO QD (or other "high intensity" statin therapy). Complete cessation from all tobacco products. Blood glucose control with goal A1c < 7%. Blood pressure control with goal blood pressure < 140/90 mmHg. Lipid reduction therapy with goal LDL-C <100 mg/dL (<70 if symptomatic from PAD).      [email or web address]    JBroadus John MD [facility] [phone]

## 2021-10-17 ENCOUNTER — Other Ambulatory Visit: Payer: Self-pay | Admitting: *Deleted

## 2021-10-17 DIAGNOSIS — I739 Peripheral vascular disease, unspecified: Secondary | ICD-10-CM

## 2021-10-18 ENCOUNTER — Ambulatory Visit: Payer: Medicare HMO | Admitting: Vascular Surgery

## 2021-10-18 ENCOUNTER — Encounter: Payer: Self-pay | Admitting: Vascular Surgery

## 2021-10-18 ENCOUNTER — Ambulatory Visit (HOSPITAL_COMMUNITY)
Admission: RE | Admit: 2021-10-18 | Discharge: 2021-10-18 | Disposition: A | Discharge status: Home / Self Care | Payer: Medicare HMO | Source: Ambulatory Visit | Attending: Vascular Surgery | Admitting: Vascular Surgery

## 2021-10-18 ENCOUNTER — Other Ambulatory Visit: Payer: Self-pay

## 2021-10-18 VITALS — BP 168/73 | HR 60 | Temp 97.9°F | Resp 20 | Ht 69.0 in | Wt 147.0 lb

## 2021-10-18 DIAGNOSIS — I739 Peripheral vascular disease, unspecified: Secondary | ICD-10-CM | POA: Diagnosis not present

## 2021-10-18 DIAGNOSIS — I70221 Atherosclerosis of native arteries of extremities with rest pain, right leg: Secondary | ICD-10-CM | POA: Diagnosis not present

## 2021-10-23 ENCOUNTER — Other Ambulatory Visit: Payer: Self-pay

## 2021-10-23 ENCOUNTER — Encounter (HOSPITAL_COMMUNITY): Payer: Self-pay | Admitting: Vascular Surgery

## 2021-10-23 ENCOUNTER — Ambulatory Visit (HOSPITAL_COMMUNITY)
Admission: RE | Admit: 2021-10-23 | Discharge: 2021-10-23 | Disposition: A | Discharge status: Home / Self Care | Payer: Medicare HMO | Attending: Vascular Surgery | Admitting: Vascular Surgery

## 2021-10-23 ENCOUNTER — Encounter (HOSPITAL_COMMUNITY)
Admission: RE | Disposition: A | Discharge status: Home / Self Care | Payer: Self-pay | Source: Home / Self Care | Attending: Vascular Surgery

## 2021-10-23 DIAGNOSIS — I10 Essential (primary) hypertension: Secondary | ICD-10-CM | POA: Insufficient documentation

## 2021-10-23 DIAGNOSIS — Z888 Allergy status to other drugs, medicaments and biological substances status: Secondary | ICD-10-CM | POA: Insufficient documentation

## 2021-10-23 DIAGNOSIS — E11621 Type 2 diabetes mellitus with foot ulcer: Secondary | ICD-10-CM | POA: Insufficient documentation

## 2021-10-23 DIAGNOSIS — Z7982 Long term (current) use of aspirin: Secondary | ICD-10-CM | POA: Insufficient documentation

## 2021-10-23 DIAGNOSIS — Z885 Allergy status to narcotic agent status: Secondary | ICD-10-CM | POA: Diagnosis not present

## 2021-10-23 DIAGNOSIS — Z880 Allergy status to penicillin: Secondary | ICD-10-CM | POA: Insufficient documentation

## 2021-10-23 DIAGNOSIS — L97519 Non-pressure chronic ulcer of other part of right foot with unspecified severity: Secondary | ICD-10-CM | POA: Diagnosis not present

## 2021-10-23 DIAGNOSIS — I70235 Atherosclerosis of native arteries of right leg with ulceration of other part of foot: Secondary | ICD-10-CM | POA: Diagnosis not present

## 2021-10-23 DIAGNOSIS — E1151 Type 2 diabetes mellitus with diabetic peripheral angiopathy without gangrene: Secondary | ICD-10-CM | POA: Insufficient documentation

## 2021-10-23 HISTORY — PX: ABDOMINAL AORTOGRAM W/LOWER EXTREMITY: CATH118223

## 2021-10-23 LAB — POCT I-STAT, CHEM 8
BUN: 42 mg/dL — ABNORMAL HIGH (ref 8–23)
Calcium, Ion: 1.11 mmol/L — ABNORMAL LOW (ref 1.15–1.40)
Chloride: 113 mmol/L — ABNORMAL HIGH (ref 98–111)
Creatinine, Ser: 2 mg/dL — ABNORMAL HIGH (ref 0.44–1.00)
Glucose, Bld: 179 mg/dL — ABNORMAL HIGH (ref 70–99)
HCT: 30 % — ABNORMAL LOW (ref 36.0–46.0)
Hemoglobin: 10.2 g/dL — ABNORMAL LOW (ref 12.0–15.0)
Potassium: 4.9 mmol/L (ref 3.5–5.1)
Sodium: 138 mmol/L (ref 135–145)
TCO2: 16 mmol/L — ABNORMAL LOW (ref 22–32)

## 2021-10-23 SURGERY — ABDOMINAL AORTOGRAM W/LOWER EXTREMITY
Anesthesia: LOCAL

## 2021-10-23 MED ORDER — VERAPAMIL HCL 2.5 MG/ML IV SOLN
INTRAVENOUS | Status: AC
  Filled 2021-10-23: qty 2

## 2021-10-23 MED ORDER — LIDOCAINE HCL (PF) 1 % IJ SOLN
INTRAMUSCULAR | Status: DC | PRN
  Administered 2021-10-23: 15 mL

## 2021-10-23 MED ORDER — HYDRALAZINE HCL 20 MG/ML IJ SOLN: 5.0000 mg | INTRAMUSCULAR | Status: DC | PRN

## 2021-10-23 MED ORDER — HEPARIN (PORCINE) IN NACL 1000-0.9 UT/500ML-% IV SOLN
INTRAVENOUS | Status: DC | PRN
  Administered 2021-10-23 (×2): 500 mL

## 2021-10-23 MED ORDER — FENTANYL CITRATE (PF) 100 MCG/2ML IJ SOLN
INTRAMUSCULAR | Status: AC
  Filled 2021-10-23: qty 2

## 2021-10-23 MED ORDER — SODIUM CHLORIDE 0.9% FLUSH: 3.0000 mL | INTRAVENOUS | Status: DC | PRN

## 2021-10-23 MED ORDER — LABETALOL HCL 5 MG/ML IV SOLN: 10.0000 mg | INTRAVENOUS | Status: DC | PRN

## 2021-10-23 MED ORDER — LIDOCAINE HCL (PF) 1 % IJ SOLN
INTRAMUSCULAR | Status: AC
  Filled 2021-10-23: qty 30

## 2021-10-23 MED ORDER — SODIUM CHLORIDE 0.9 % IV SOLN: INTRAVENOUS | Status: DC

## 2021-10-23 MED ORDER — ACETAMINOPHEN 325 MG PO TABS: 650.0000 mg | ORAL_TABLET | ORAL | Status: DC | PRN

## 2021-10-23 MED ORDER — ONDANSETRON HCL 4 MG/2ML IJ SOLN: 4.0000 mg | Freq: Four times a day (QID) | INTRAMUSCULAR | Status: DC | PRN

## 2021-10-23 MED ORDER — SODIUM CHLORIDE 0.9% FLUSH: 3.0000 mL | Freq: Two times a day (BID) | INTRAVENOUS | Status: DC

## 2021-10-23 MED ORDER — MIDAZOLAM HCL 2 MG/2ML IJ SOLN
INTRAMUSCULAR | Status: DC | PRN
  Administered 2021-10-23: 1 mg via INTRAVENOUS

## 2021-10-23 MED ORDER — IODIXANOL 320 MG/ML IV SOLN
INTRAVENOUS | Status: DC | PRN
  Administered 2021-10-23: 75 mL via INTRA_ARTERIAL

## 2021-10-23 MED ORDER — SODIUM CHLORIDE 0.9 % IV SOLN: 250.0000 mL | INTRAVENOUS | Status: DC | PRN

## 2021-10-23 MED ORDER — MIDAZOLAM HCL 2 MG/2ML IJ SOLN
INTRAMUSCULAR | Status: AC
  Filled 2021-10-23: qty 2

## 2021-10-23 MED ORDER — FENTANYL CITRATE (PF) 100 MCG/2ML IJ SOLN
INTRAMUSCULAR | Status: DC | PRN
  Administered 2021-10-23: 25 ug via INTRAVENOUS

## 2021-10-23 MED ORDER — SODIUM CHLORIDE 0.9 % WEIGHT BASED INFUSION: 1.0000 mL/kg/h | INTRAVENOUS | Status: DC

## 2021-10-23 MED ORDER — HEPARIN (PORCINE) IN NACL 1000-0.9 UT/500ML-% IV SOLN
INTRAVENOUS | Status: AC
  Filled 2021-10-23: qty 1000

## 2021-10-23 SURGICAL SUPPLY — 11 items
CATH OMNI FLUSH 5F 65CM (CATHETERS) ×2 IMPLANT
DEVICE CLOSURE MYNXGRIP 5F (Vascular Products) ×2 IMPLANT
GLIDEWIRE ADV .035X260CM (WIRE) ×2 IMPLANT
KIT MICROPUNCTURE NIT STIFF (SHEATH) ×2 IMPLANT
KIT PV (KITS) ×2 IMPLANT
SHEATH PINNACLE 5F 10CM (SHEATH) ×2 IMPLANT
SHEATH PROBE COVER 6X72 (BAG) ×2 IMPLANT
SYR MEDRAD MARK V 150ML (SYRINGE) ×2 IMPLANT
TRANSDUCER W/STOPCOCK (MISCELLANEOUS) ×2 IMPLANT
TRAY PV CATH (CUSTOM PROCEDURE TRAY) ×2 IMPLANT
WIRE BENTSON .035X145CM (WIRE) ×2 IMPLANT

## 2021-10-23 NOTE — Progress Notes (Deleted)
Office Note  Patient seen and examined this morning in preoperative holding She was last seen in clinic on Friday with Rutherford 5 critical limb ischemia in the right leg No changes in medications, medical history since her visit Did not take her baby aspirin this morning After discussing the risks and benefits of right lower extremity angiogram, in an effort to improve perfusion to her leg, Krista Rosales elected to proceed. Most recent creatinine 2, will adjust contrast max accordingly.  Broadus John MD   CC: Concern for peripheral arterial disease Requesting Provider:  No ref. provider found  HPI: Krista Rosales is a 87 y.o. (1934-12-21) female presenting at the request of .Josetta Huddle, MD with concern for peripheral arterial disease.  She is followed by Dr. Jacqualyn Posey, with podiatry, and was recently fitted for new diabetic shoes.  Being fitted, he appreciated the wound the lateral aspect of the right foot.  Per [place], this has been there for over a month and is failed to heal.    Krista Rosales remains independent, but her ambulation has been limited by trying to be nonweightbearing on the right foot.  She denies fevers, chills, erythema, induration.  She has no pain the ulceration.  Longstanding diabetes.  The pt is not on a statin for cholesterol management.  The pt is  on a daily aspirin.   Other AC:  - The pt is  on medication for hypertension.   The pt is is diabetic.  Tobacco hx:  -  Past Medical History:  Diagnosis Date   ANEMIA, IRON DEFICIENCY 05/07/2009   Qualifier: Diagnosis of  By: Loanne Drilling MD, Sean A    ARTHRITIS 05/30/2008   Qualifier: Diagnosis of  By: Smith NCMA, [place], TYPE II 07/16/2007   Qualifier: Diagnosis of  By: Marca Ancona RMA, Lucy     Diastolic congestive heart failure ([place]) 09/2017   DIVERTICULOSIS, COLON 05/30/2008   Qualifier: Diagnosis of  By: Marland Mcalpine     Dyspnea    Glaucoma    HYPERLIPIDEMIA 07/16/2007   Qualifier: Diagnosis of  By:  Marca Ancona RMA, Lucy     Hypertension    Normocytic anemia     Past Surgical History:  Procedure Laterality Date   ABDOMINAL HYSTERECTOMY      Social History   Socioeconomic History   Marital status: Widowed    Spouse name: Not on file   Number of children: Not on file   Years of education: Not on file   Highest education level: Not on file  Occupational History   Not on file  Tobacco Use   Smoking status: Former   Smokeless tobacco: Never  Vaping Use   Vaping Use: Never used  Substance and Sexual Activity   Alcohol use: No   Drug use: No   Sexual activity: Never  Other Topics Concern   Not on file  Social History Narrative   Widowed.  No children.  Has a brother.     Social Determinants of Health   Financial Resource Strain: Not on file  Food Insecurity: Not on file  Transportation Needs: Not on file  Physical Activity: Not on file  Stress: Not on file  Social Connections: Not on file  Intimate Partner Violence: Not on file    Family History  Problem Relation Age of Onset   Diabetes Mellitus II Other    Pancreatic cancer Mother    Heart disease Father    CAD Brother  Current Facility-Administered Medications  Medication Dose Route Frequency Provider Last Rate Last Admin   0.9 %  sodium chloride infusion   Intravenous Continuous Broadus John, MD 100 mL/hr at 10/23/21 0725 New Bag at 10/23/21 0725    Allergies  Allergen Reactions   Wellbutrin [Bupropion]     unknown   Codeine     hallucinate   Penicillins Rash    Did it involve swelling of the face/tongue/throat, SOB, or low BP? Y Did it involve sudden or severe rash/hives, skin peeling, or any reaction on the inside of your mouth or nose? Y Did you need to seek medical attention at a hospital or doctor's office? Y When did it last happen?  While at hospital    If all above answers are "NO", may proceed with cephalosporin use.      REVIEW OF SYSTEMS:   [X]  denotes positive finding, [ ]   denotes negative finding Cardiac  Comments:  Chest pain or chest pressure:    Shortness of breath upon exertion:    Short of breath when lying flat:    Irregular heart rhythm:        Vascular    Pain in calf, thigh, or hip brought on by ambulation:    Pain in feet at night that wakes you up from your sleep:     Blood clot in your veins:    Leg swelling:         Pulmonary    Oxygen at home:    Productive cough:     Wheezing:         Neurologic    Sudden weakness in arms or legs:     Sudden numbness in arms or legs:     Sudden onset of difficulty speaking or slurred speech:    Temporary loss of vision in one eye:     Problems with dizziness:         Gastrointestinal    Blood in stool:     Vomited blood:         Genitourinary    Burning when urinating:     Blood in urine:        Psychiatric    Major depression:         Hematologic    Bleeding problems:    Problems with blood clotting too easily:        Skin    Rashes or ulcers:        Constitutional    Fever or chills:      PHYSICAL EXAMINATION:  Vitals:   10/23/21 0652  BP: (!) 145/58  Pulse: 67  Resp: 15  Temp: 98.3 F (36.8 C)  TempSrc: Oral  SpO2: 100%  Weight: 66.2 kg  Height: 5\' 8"  (1.727 m)    General:  WDWN in NAD; vital signs documented above Gait: Not observed HENT: WNL, normocephalic Pulmonary: normal non-labored breathing , without Rales, rhonchi,  wheezing Cardiac: regular HR,  Abdomen: soft, NT, no masses Skin: without rashes Vascular Exam/Pulses:  Right Left  Radial 2+ (normal) 2+ (normal)  Ulnar 2+ (normal) 2+ (normal)  Femoral 2+ 2+  Popliteal    DP absent absent  PT absent absent   Extremities: with ischemic changes, without Gangrene , without cellulitis; with open wounds; Right lateral foot   Musculoskeletal: no muscle wasting or atrophy  Neurologic: A&O X 3;  No focal weakness or paresthesias are detected Psychiatric:  The pt has Normal affect.   Non-Invasive  Vascular Imaging:  Noninvasive vascular imaging was significant reviewed.  Noncompressible ABIs likely due to medial calcinosis from longstanding diabetes.  Monophasic waveforms with poor toe pressure 49 on the right, 73 on the left.    ASSESSMENT/PLAN: Krista Rosales is a 87 y.o. female presenting with nonhealing ulceration on the lateral aspect of her right foot.  Krista Rosales's peripheral arterial disease is defined as Rutherford 5 critical limb ischemia. Without intervention, roses wound will likely not heal and subsequent amputation at undefined level would be needed. Krista Rosales was adamant she will not receive any amputation.  After discussing the risk and benefits of lower extremity angiogram with both Krista Rosales and her daughter (who has excellent background medical knowledge) Krista Rosales elected to proceed.  WIFI Score: Wound: 1, Ischemia 2, Foot infection 0 Estimate risk of amputation at one year without revascularization Moderate  Recommend the following which can slow the progression of atherosclerosis and reduce the risk of major adverse cardiac / limb events:  Aspirin 81mg  PO QD.  Atorvastatin 40-80mg  PO QD (or other "high intensity" statin therapy). Complete cessation from all tobacco products. Blood glucose control with goal A1c < 7%. Blood pressure control with goal blood pressure < 140/90 mmHg. Lipid reduction therapy with goal LDL-C <100 mg/dL (<70 if symptomatic from PAD).      [email or web address]     Broadus John, MD [facility] [phone]

## 2021-10-23 NOTE — Progress Notes (Signed)
Pt ambulated without difficulty or bleeding.   Discharged home with her niece who will drive and stay with pt x 24 hrs.

## 2021-10-23 NOTE — Op Note (Signed)
    Patient name: Krista Rosales MRN: [id number] DOB: 1934/01/03 Sex: female  10/23/2021 Pre-operative Diagnosis: Right lower extremity Rutherford 5 critical limb ischemia Post-operative diagnosis:  Same Surgeon:  Broadus John, MD Procedure Performed: 1.  Ultrasound-guided micropuncture access of the left common femoral artery 2.  Aortogram 3.  Second-order cannulation, right lower extremity angiogram 4.  31 minutes sedation time 5.  Device assisted closure-[employer]   Indications:    [name] is a 87 y.o. female presenting with nonhealing ulceration on the lateral aspect of her right foot. Lahari's peripheral arterial disease is defined as Rutherford 5 critical limb ischemia. [name] was adamant she will not receive any amputation.  After discussing the risk and benefits of lower extremity angiogram with both Leslieanne and her daughter (who has excellent background medical knowledge) elected to proceed.  Findings:   Aortogram: Normal bilateral renal arteries, normal infrarenal abdominal aorta.  On the right: Normal common iliac artery, normal internal iliac artery, normal external iliac artery, normal common iliac artery, normal superficial femoral artery, normal profunda, normal popliteal artery.  Anterior tibial artery with inline flow to the ankle, then become atretic with diffuse collaterals.  Peroneal artery with inline flow to the ankle, giving off atretic medial and lateral perforators.  Posterior tibial artery occluded 3 cm from its ostia with no reconstitution in the foot.  Dorsalis pedis fills through collaterals.  No plantar arteries appreciated.  On the left: Normal common iliac artery, normal internal iliac artery, normal external iliac artery, normal common femoral artery   Procedure:  The patient was identified in the holding area and taken to room 8.  The patient was then placed supine on the table and prepped and draped in the usual sterile fashion.  A time out was  called.  Ultrasound was used to evaluate the left common femoral artery.  It was patent .  A digital ultrasound image was acquired.  A micropuncture needle was used to access the left common femoral artery under ultrasound guidance.  An 018 wire was advanced without resistance and a micropuncture sheath was placed.  The 018 wire was removed and a benson wire was placed.  The micropuncture sheath was exchanged for a 5 french sheath.  An omniflush catheter was advanced over the wire to the level of L-1.  An abdominal angiogram was obtained.  Next, using the omniflush catheter and a benson wire, the aortic bifurcation was crossed and the catheter was placed into theright external iliac artery and right runoff was obtained.   Please see results above  Patient with diffuse microvascular disease at the level of the ankle and into the foot.  This is not amenable to endovascular intervention. Patient would benefit from continued wound care, pressure offloading on the lateral aspect of her right foot.  I will discuss these findings with Dr. Jacqualyn Posey - DPM    Cassandria Santee, MD [facility]: [phone]

## 2021-10-23 NOTE — H&P (Signed)
Office Note  Patient seen and examined this morning in preoperative holding She was last seen in clinic on Friday with Rutherford 5 critical limb ischemia in the right leg No changes in medications, medical history since her visit Did not take her baby aspirin this morning After discussing the risks and benefits of right lower extremity angiogram, in an effort to improve perfusion to her leg, Krista Rosales elected to proceed. Most recent creatinine 2, will adjust contrast max accordingly.  Broadus John MD   CC: Concern for peripheral arterial disease Requesting Provider:  No ref. provider found  HPI: Krista Rosales is a 87 y.o. (10-May-1934) female presenting at the request of .Josetta Huddle, MD with concern for peripheral arterial disease.  She is followed by Dr. Jacqualyn Posey, with podiatry, and was recently fitted for new diabetic shoes.  Being fitted, he appreciated the wound the lateral aspect of the right foot.  Per [place], this has been there for over a month and is failed to heal.    Krista Rosales remains independent, but her ambulation has been limited by trying to be nonweightbearing on the right foot.  She denies fevers, chills, erythema, induration.  She has no pain the ulceration.  Longstanding diabetes.  The pt is not on a statin for cholesterol management.  The pt is  on a daily aspirin.   Other AC:  - The pt is  on medication for hypertension.   The pt is is diabetic.  Tobacco hx:  -  Past Medical History:  Diagnosis Date   ANEMIA, IRON DEFICIENCY 05/07/2009   Qualifier: Diagnosis of  By: Loanne Drilling MD, Sean A    ARTHRITIS 05/30/2008   Qualifier: Diagnosis of  By: Smith NCMA, [place], TYPE II 07/16/2007   Qualifier: Diagnosis of  By: Marca Ancona RMA, Lucy     Diastolic congestive heart failure ([place]) 09/2017   DIVERTICULOSIS, COLON 05/30/2008   Qualifier: Diagnosis of  By: Marland Mcalpine     Dyspnea    Glaucoma    HYPERLIPIDEMIA 07/16/2007   Qualifier: Diagnosis of  By:  Marca Ancona RMA, Lucy     Hypertension    Normocytic anemia     Past Surgical History:  Procedure Laterality Date   ABDOMINAL HYSTERECTOMY      Social History   Socioeconomic History   Marital status: Widowed    Spouse name: Not on file   Number of children: Not on file   Years of education: Not on file   Highest education level: Not on file  Occupational History   Not on file  Tobacco Use   Smoking status: Former   Smokeless tobacco: Never  Vaping Use   Vaping Use: Never used  Substance and Sexual Activity   Alcohol use: No   Drug use: No   Sexual activity: Never  Other Topics Concern   Not on file  Social History Narrative   Widowed.  No children.  Has a brother.     Social Determinants of Health   Financial Resource Strain: Not on file  Food Insecurity: Not on file  Transportation Needs: Not on file  Physical Activity: Not on file  Stress: Not on file  Social Connections: Not on file  Intimate Partner Violence: Not on file    Family History  Problem Relation Age of Onset   Diabetes Mellitus II Other    Pancreatic cancer Mother    Heart disease Father    CAD Brother  Current Facility-Administered Medications  Medication Dose Route Frequency Provider Last Rate Last Admin   0.9 %  sodium chloride infusion   Intravenous Continuous Broadus John, MD 100 mL/hr at 10/23/21 0725 New Bag at 10/23/21 0725    Allergies  Allergen Reactions   Wellbutrin [Bupropion]     unknown   Codeine     hallucinate   Penicillins Rash    Did it involve swelling of the face/tongue/throat, SOB, or low BP? Y Did it involve sudden or severe rash/hives, skin peeling, or any reaction on the inside of your mouth or nose? Y Did you need to seek medical attention at a hospital or doctor's office? Y When did it last happen?  While at hospital    If all above answers are "NO", may proceed with cephalosporin use.      REVIEW OF SYSTEMS:   [X]  denotes positive finding, [ ]   denotes negative finding Cardiac  Comments:  Chest pain or chest pressure:    Shortness of breath upon exertion:    Short of breath when lying flat:    Irregular heart rhythm:        Vascular    Pain in calf, thigh, or hip brought on by ambulation:    Pain in feet at night that wakes you up from your sleep:     Blood clot in your veins:    Leg swelling:         Pulmonary    Oxygen at home:    Productive cough:     Wheezing:         Neurologic    Sudden weakness in arms or legs:     Sudden numbness in arms or legs:     Sudden onset of difficulty speaking or slurred speech:    Temporary loss of vision in one eye:     Problems with dizziness:         Gastrointestinal    Blood in stool:     Vomited blood:         Genitourinary    Burning when urinating:     Blood in urine:        Psychiatric    Major depression:         Hematologic    Bleeding problems:    Problems with blood clotting too easily:        Skin    Rashes or ulcers:        Constitutional    Fever or chills:      PHYSICAL EXAMINATION:  Vitals:   10/23/21 0652  BP: (!) 145/58  Pulse: 67  Resp: 15  Temp: 98.3 F (36.8 C)  TempSrc: Oral  SpO2: 100%  Weight: 66.2 kg  Height: 5\' 8"  (1.727 m)    General:  WDWN in NAD; vital signs documented above Gait: Not observed HENT: WNL, normocephalic Pulmonary: normal non-labored breathing , without Rales, rhonchi,  wheezing Cardiac: regular HR,  Abdomen: soft, NT, no masses Skin: without rashes Vascular Exam/Pulses:  Right Left  Radial 2+ (normal) 2+ (normal)  Ulnar 2+ (normal) 2+ (normal)  Femoral 2+ 2+  Popliteal    DP absent absent  PT absent absent   Extremities: with ischemic changes, without Gangrene , without cellulitis; with open wounds; Right lateral foot   Musculoskeletal: no muscle wasting or atrophy  Neurologic: A&O X 3;  No focal weakness or paresthesias are detected Psychiatric:  The pt has Normal affect.   Non-Invasive  Vascular Imaging:  Noninvasive vascular imaging was significant reviewed.  Noncompressible ABIs likely due to medial calcinosis from longstanding diabetes.  Monophasic waveforms with poor toe pressure 49 on the right, 73 on the left.    ASSESSMENT/PLAN: Krista Rosales is a 87 y.o. female presenting with nonhealing ulceration on the lateral aspect of her right foot.  Dionicia's peripheral arterial disease is defined as Rutherford 5 critical limb ischemia. Without intervention, roses wound will likely not heal and subsequent amputation at undefined level would be needed. Ladaija was adamant she will not receive any amputation.  After discussing the risk and benefits of lower extremity angiogram with both Carleah and her daughter (who has excellent background medical knowledge) Demaya elected to proceed.  WIFI Score: Wound: 1, Ischemia 2, Foot infection 0 Estimate risk of amputation at one year without revascularization Moderate  Recommend the following which can slow the progression of atherosclerosis and reduce the risk of major adverse cardiac / limb events:  Aspirin 81mg  PO QD.  Atorvastatin 40-80mg  PO QD (or other "high intensity" statin therapy). Complete cessation from all tobacco products. Blood glucose control with goal A1c < 7%. Blood pressure control with goal blood pressure < 140/90 mmHg. Lipid reduction therapy with goal LDL-C <100 mg/dL (<70 if symptomatic from PAD).      [email or web address]     Broadus John, MD [facility] [phone]

## 2021-10-30 DIAGNOSIS — I700 Atherosclerosis of aorta: Secondary | ICD-10-CM | POA: Diagnosis not present

## 2021-10-30 DIAGNOSIS — N184 Chronic kidney disease, stage 4 (severe): Secondary | ICD-10-CM | POA: Diagnosis not present

## 2021-10-30 DIAGNOSIS — I509 Heart failure, unspecified: Secondary | ICD-10-CM | POA: Diagnosis not present

## 2021-10-30 DIAGNOSIS — E1165 Type 2 diabetes mellitus with hyperglycemia: Secondary | ICD-10-CM | POA: Diagnosis not present

## 2021-10-30 DIAGNOSIS — I998 Other disorder of circulatory system: Secondary | ICD-10-CM | POA: Diagnosis not present

## 2021-10-30 DIAGNOSIS — E1142 Type 2 diabetes mellitus with diabetic polyneuropathy: Secondary | ICD-10-CM | POA: Diagnosis not present

## 2021-10-30 DIAGNOSIS — L97519 Non-pressure chronic ulcer of other part of right foot with unspecified severity: Secondary | ICD-10-CM | POA: Diagnosis not present

## 2021-11-01 NOTE — Progress Notes (Signed)
   I, [name], LAT, ATC, am serving as scribe for Dr. [name].  Krista Rosales is a 87 y.o. female who presents to [place] at [facility] today for f/u of R wrist pain due to gout and DJD.  She was last seen by Dr. Georgina Snell on 09/26/21 and had a R wrist injection.  She also had labs that indicated an elevated uric acid level of 10.4 mg/dL.  She was then prescribed allopurinol and advised to take 1/2 pill every other day.  Today, pt reports her R wrist is doing well.  She has been taking the allopurinol as prescribed.  Diagnostic testing: labs- 09/26/21; R wrist XR- 08/14/21; R hand XR- 06/07/21  Pertinent review of systems: No fevers or chills  Relevant historical information: Hypertension.  PAD.  Ulcer right foot.   Exam:  BP (!) 148/60 (BP Location: Right Arm, Patient Position: Sitting, Cuff Size: Normal)   Pulse 69   Ht 5\' 8"  (1.727 m)   Wt 153 lb 9.6 oz (69.7 kg)   SpO2 95%   BMI 23.35 kg/m  General: Well Developed, well nourished, and in no acute distress.   MSK: Right wrist normal-appearing nontender normal motion.      Assessment and Plan: 87 y.o. female with right wrist pain due to gout.  Improved.  We will check uric acid and recheck basic metabolic panel.  We will adjust allopurinol dose based on lab results.  Likely will increase to 50 mg daily from every other day if uric acid is still above goal of 6.   Since she is doing quite well.  Recheck in about 2 months.   PDMP not reviewed this encounter. Orders Placed This Encounter  Procedures   Uric acid    Standing Status:   Future    Number of Occurrences:   1    Standing Expiration Date:   64/68/0321   Basic metabolic panel    Standing Status:   Future    Number of Occurrences:   1    Standing Expiration Date:   11/04/2022    Order Specific Question:   Has the patient fasted?    Answer:   No   No orders of the defined types were placed in this encounter.    Discussed warning signs  or symptoms. Please see discharge instructions. Patient expresses understanding.   The above documentation has been reviewed and is accurate and complete [name], M.D.

## 2021-11-04 ENCOUNTER — Ambulatory Visit (INDEPENDENT_AMBULATORY_CARE_PROVIDER_SITE_OTHER): Payer: Medicare HMO | Admitting: Family Medicine

## 2021-11-04 ENCOUNTER — Other Ambulatory Visit: Payer: Self-pay | Admitting: Physical Therapy

## 2021-11-04 ENCOUNTER — Other Ambulatory Visit: Payer: Self-pay

## 2021-11-04 ENCOUNTER — Encounter: Payer: Self-pay | Admitting: Family Medicine

## 2021-11-04 VITALS — BP 148/60 | HR 69 | Ht 68.0 in | Wt 153.6 lb

## 2021-11-04 DIAGNOSIS — M2550 Pain in unspecified joint: Secondary | ICD-10-CM

## 2021-11-04 DIAGNOSIS — M109 Gout, unspecified: Secondary | ICD-10-CM

## 2021-11-04 LAB — BASIC METABOLIC PANEL
BUN: 46 mg/dL — ABNORMAL HIGH (ref 6–23)
CO2: 19 mEq/L (ref 19–32)
Calcium: 9 mg/dL (ref 8.4–10.5)
Chloride: 105 mEq/L (ref 96–112)
Creatinine, Ser: 1.83 mg/dL — ABNORMAL HIGH (ref 0.40–1.20)
GFR: 24.91 mL/min — ABNORMAL LOW (ref >60.00)
Glucose, Bld: 162 mg/dL — ABNORMAL HIGH (ref 70–99)
Potassium: 4.4 mEq/L (ref 3.5–5.1)
Sodium: 136 mEq/L (ref 135–145)

## 2021-11-04 LAB — URIC ACID: Uric Acid, Serum: 8.6 mg/dL — ABNORMAL HIGH (ref 2.4–7.0)

## 2021-11-04 MED ORDER — ALLOPURINOL 100 MG PO TABS: 50.0000 mg | ORAL_TABLET | Freq: Every day | ORAL | 1 refills | Status: DC

## 2021-11-04 NOTE — Patient Instructions (Addendum)
Good to see you today.  Please get labs today before you leave.  Follow-up: 2 months

## 2021-11-04 NOTE — Addendum Note (Signed)
Addended by: Gregor Hams on: 11/04/2021 04:10 PM   Modules accepted: Orders

## 2021-11-04 NOTE — Progress Notes (Signed)
Uric acid is 8.6 which is better than 1 month ago but still higher than it should be.  Plan to increase the allopurinol to half a pill daily.  We will plan to recheck the lab in about 2 months.  Kidney function is stable or slightly improved

## 2021-11-05 DIAGNOSIS — L97512 Non-pressure chronic ulcer of other part of right foot with fat layer exposed: Secondary | ICD-10-CM | POA: Diagnosis not present

## 2021-11-07 DIAGNOSIS — H43822 Vitreomacular adhesion, left eye: Secondary | ICD-10-CM | POA: Diagnosis not present

## 2021-11-07 DIAGNOSIS — H35373 Puckering of macula, bilateral: Secondary | ICD-10-CM | POA: Diagnosis not present

## 2021-11-07 DIAGNOSIS — H43813 Vitreous degeneration, bilateral: Secondary | ICD-10-CM | POA: Diagnosis not present

## 2021-11-07 DIAGNOSIS — E113593 Type 2 diabetes mellitus with proliferative diabetic retinopathy without macular edema, bilateral: Secondary | ICD-10-CM | POA: Diagnosis not present

## 2021-11-19 DIAGNOSIS — L97512 Non-pressure chronic ulcer of other part of right foot with fat layer exposed: Secondary | ICD-10-CM | POA: Diagnosis not present

## 2021-11-28 DIAGNOSIS — E1142 Type 2 diabetes mellitus with diabetic polyneuropathy: Secondary | ICD-10-CM | POA: Diagnosis not present

## 2021-11-28 DIAGNOSIS — N184 Chronic kidney disease, stage 4 (severe): Secondary | ICD-10-CM | POA: Diagnosis not present

## 2021-11-28 DIAGNOSIS — L97519 Non-pressure chronic ulcer of other part of right foot with unspecified severity: Secondary | ICD-10-CM | POA: Diagnosis not present

## 2021-11-28 DIAGNOSIS — I700 Atherosclerosis of aorta: Secondary | ICD-10-CM | POA: Diagnosis not present

## 2021-11-28 DIAGNOSIS — I509 Heart failure, unspecified: Secondary | ICD-10-CM | POA: Diagnosis not present

## 2021-11-28 DIAGNOSIS — M109 Gout, unspecified: Secondary | ICD-10-CM | POA: Diagnosis not present

## 2021-11-28 DIAGNOSIS — E1165 Type 2 diabetes mellitus with hyperglycemia: Secondary | ICD-10-CM | POA: Diagnosis not present

## 2021-11-28 DIAGNOSIS — I998 Other disorder of circulatory system: Secondary | ICD-10-CM | POA: Diagnosis not present

## 2021-12-03 DIAGNOSIS — L97512 Non-pressure chronic ulcer of other part of right foot with fat layer exposed: Secondary | ICD-10-CM | POA: Diagnosis not present

## 2021-12-03 NOTE — Progress Notes (Signed)
I, Krista Rosales, LAT, ATC, am serving as scribe for Dr. Lynne Rosales.  Krista Rosales is a 87 y.o. female who presents to [place] at [facility] today for f/u of a R wrist gout flare.  She was last seen by Dr. Georgina Snell on 11/04/21 and noted improvement in her R wrist pain after having an injection on 09/26/21.  She was also prescribed allopurinol and was advised to take 1/2 pill daily.  Today, pt reports she is having pain in her R hand and thumb, L middle finger and L knee x one week.  She states that her PCP switched her to a full Allopurinol/day and ran out about 3 days ago.  A new prescription at the appropriate dose of allopurinol was sent to her mail order pharmacy and has not yet arrived.  Diagnostic testing: labs- 09/26/21; R wrist XR- 08/14/21; R hand XR- 06/07/21  Pertinent review of systems: No fevers or chills  Relevant historical information: CKD.   Exam:  BP 96/60 (BP Location: Left Arm, Patient Position: Sitting, Cuff Size: Normal)   Pulse 87   Ht 5\' 8"  (1.727 m)   Wt 155 lb 6.4 oz (70.5 kg)   SpO2 97%   BMI 23.63 kg/m  General: Well Developed, well nourished, and in no acute distress.   MSK: Right hand and wrist.  Swelling at dorsal wrist, base of thumb, second and third and fourth MCP and DIP. Left knee mild joint effusion with normal motion.    Lab and Radiology Results EXAM: RIGHT WRIST - COMPLETE 3+ VIEW   COMPARISON:  Right hand radiographs 06/07/2009   FINDINGS: There is no acute fracture or dislocation. Alignment is normal. There is radiocarpal joint space narrowing with associated subchondral sclerosis. There is mild thumb and index finger CMC joint space narrowing. The remaining joint spaces appear preserved.   Extensive vascular calcifications are noted. The soft tissues are otherwise unremarkable.   IMPRESSION: Mild degenerative changes about the wrist as above. No acute findings.     Electronically Signed   By: Valetta Mole  M.D.   On: 08/15/2021 09:43   Lab Results  Component Value Date   LABURIC 8.6 (H) 11/04/2021     Chemistry      Component Value Date/Time   NA 136 11/04/2021 0927   NA 140 02/14/2021 0802   K 4.4 11/04/2021 0927   CL 105 11/04/2021 0927   CO2 19 11/04/2021 0927   BUN 46 (H) 11/04/2021 0927   BUN 55 (H) 02/14/2021 0802   CREATININE 1.83 (H) 11/04/2021 0927      Component Value Date/Time   CALCIUM 9.0 11/04/2021 0927   ALKPHOS 36 (L) 04/20/2020 2013   AST 24 04/20/2020 2013   ALT 14 04/20/2020 2013   BILITOT 0.6 04/20/2020 2013        Assessment and Plan: 87 y.o. female with  Multiple joint pain and swelling almost certainly secondary to gout. Her allopurinol dose has been increased which is probably appropriate.  However today she is having an acute gout flare.  Plan to start low-dose colchicine and short-term low-dose prednisone.  Plan to recheck in 1.5-2 weeks and we will proceed with joint injection and the remaining badly painful or swollen joints.  Fundamentally she has to many joints involved today to safely inject therefore using systemic steroids is probably better.  Of note I had to adjust dose of colchicine down to renal function and potential drug drug interaction.  PDMP not  reviewed this encounter. No orders of the defined types were placed in this encounter.  Meds ordered this encounter  Medications   colchicine 0.6 MG tablet    Sig: Take 0.5 tablets (0.3 mg total) by mouth daily as needed (gout or psuedogout pain).    Dispense:  30 tablet    Refill:  2    Cannot use capsules as she needs to use low does due to renal function   predniSONE (DELTASONE) 10 MG tablet    Sig: Take 3 tablets (30 mg total) by mouth daily with breakfast.    Dispense:  15 tablet    Refill:  0     Discussed warning signs or symptoms. Please see discharge instructions. Patient expresses understanding.   The above documentation has been reviewed and is accurate and complete  Krista Rosales, M.D.

## 2021-12-04 ENCOUNTER — Ambulatory Visit (INDEPENDENT_AMBULATORY_CARE_PROVIDER_SITE_OTHER): Payer: Medicare HMO | Admitting: Family Medicine

## 2021-12-04 ENCOUNTER — Ambulatory Visit: Payer: Self-pay

## 2021-12-04 ENCOUNTER — Other Ambulatory Visit: Payer: Self-pay

## 2021-12-04 ENCOUNTER — Encounter: Payer: Self-pay | Admitting: Family Medicine

## 2021-12-04 VITALS — BP 96/60 | HR 87 | Ht 68.0 in | Wt 155.4 lb

## 2021-12-04 DIAGNOSIS — M79645 Pain in left finger(s): Secondary | ICD-10-CM

## 2021-12-04 DIAGNOSIS — G8929 Other chronic pain: Secondary | ICD-10-CM | POA: Diagnosis not present

## 2021-12-04 DIAGNOSIS — M79644 Pain in right finger(s): Secondary | ICD-10-CM | POA: Diagnosis not present

## 2021-12-04 DIAGNOSIS — M25531 Pain in right wrist: Secondary | ICD-10-CM

## 2021-12-04 DIAGNOSIS — M25562 Pain in left knee: Secondary | ICD-10-CM

## 2021-12-04 MED ORDER — PREDNISONE 10 MG PO TABS: 30.0000 mg | ORAL_TABLET | Freq: Every day | ORAL | 0 refills | Status: DC

## 2021-12-04 MED ORDER — COLCHICINE 0.6 MG PO TABS: 0.3000 mg | ORAL_TABLET | Freq: Every day | ORAL | 2 refills | Status: DC | PRN

## 2021-12-04 NOTE — Patient Instructions (Addendum)
Good to see you today.  I've prescribed you colchicine.  Take 1/2 pill daily.  You will take Allopurinol daily indefinitely and you'll take the colchicine daily for the next month.  Prednisone: Take 3 pills/day for 5 days.  Follow-up: in 2 weeks

## 2021-12-06 ENCOUNTER — Telehealth: Payer: Self-pay | Admitting: Family Medicine

## 2021-12-06 NOTE — Telephone Encounter (Signed)
Called patient and tell her to stop

## 2021-12-06 NOTE — Telephone Encounter (Signed)
Pt called, Dr. Georgina Snell prescribed prednisone this week. At initial dose of 3, pt BS went from 85 to 316.  This was yesterday, she has not taken since then.  Would we recommend taking a lower dose or NOT taking at all.  Sending to Dr. Tamala Julian in Dr. Clovis Riley absence.

## 2021-12-18 DIAGNOSIS — L97512 Non-pressure chronic ulcer of other part of right foot with fat layer exposed: Secondary | ICD-10-CM | POA: Diagnosis not present

## 2021-12-19 ENCOUNTER — Ambulatory Visit: Payer: Medicare HMO | Admitting: Family Medicine

## 2021-12-20 ENCOUNTER — Ambulatory Visit: Payer: Medicare HMO | Admitting: Family Medicine

## 2021-12-30 ENCOUNTER — Ambulatory Visit: Payer: Medicare HMO | Admitting: Family Medicine

## 2022-01-01 DIAGNOSIS — L97512 Non-pressure chronic ulcer of other part of right foot with fat layer exposed: Secondary | ICD-10-CM | POA: Diagnosis not present

## 2022-01-07 NOTE — Progress Notes (Signed)
I, [name], LAT, ATC, am serving as scribe for Dr. [name].  Krista Rosales is a 88 y.o. female who presents to [place] at [facility] today for f/u of chronic L knee pain.  She was last seen by Dr. Georgina Snell on 12/04/21 for her R wrist and previously on 03/30/20 for her L knee. At her last visit, pt was prescribed colchicine and prednisone for an acute gout flare and was advised to con't taking her allopurinol.  Today, pt reports L knee pain started hurting again, really bad, on Sunday, 1/15. Pt notes she is not taking the colchicine due to insurance denial.  Due to her renal function her only tolerable colchicine dose is 0.3 mg daily and her insurance only approve colchicine capsules which the smallest dose is 0.6.  Pt did not tolerate the prednisone as it make her blood sugar go up.     Diagnostic testing: L knee XR- 03/30/20  Pertinent review of systems: No fevers or chills  Relevant historical information: Gout and diabetes.   Exam:  BP (!) 158/86    Pulse 80    Ht 5\' 8"  (1.727 m)    SpO2 98%    BMI 23.63 kg/m  General: Well Developed, well nourished, and in no acute distress.   MSK: Left knee large joint effusion. Diffusely tender. Decreased range of motion. Mild antalgic gait.    Lab and Radiology Results  Procedure: Real-time Ultrasound Guided Injection of left knee superior lateral patellar space Device: Philips Affiniti 50G Images permanently stored and available for review in PACS Ultrasound evaluation prior to injection shows large joint effusion is present Verbal informed consent obtained.  Discussed risks and benefits of procedure. Warned about infection bleeding damage to structures skin hypopigmentation and fat atrophy among others. Patient expresses understanding and agreement Time-out conducted.   Noted no overlying erythema, induration, or other signs of local infection.   Skin prepped in a sterile fashion.   Local anesthesia: Topical  Ethyl chloride.   With sterile technique and under real time ultrasound guidance: 40 mg of Kenalog and 2 mL of Marcaine injected into knee joint. Fluid seen entering the joint capsule.   Completed without difficulty   Pain immediately resolved suggesting accurate placement of the medication.   Advised to call if fevers/chills, erythema, induration, drainage, or persistent bleeding.   Images permanently stored and available for review in the ultrasound unit.  Impression: Technically successful ultrasound guided injection.     EXAM: LEFT KNEE 3 VIEWS   COMPARISON:  None.   FINDINGS: Extensive vascular calcifications. Osteopenia. No fractures or bony lesions. No effusions.   IMPRESSION: Osteopenia. Extensive vascular calcifications. No other acute abnormalities.     Electronically Signed   By: Dorise Bullion III M.D   On: 03/31/2020 17:47 I, [name], personally (independently) visualized and performed the interpretation of the images attached in this note.  Lab Results  Component Value Date   LABURIC 8.6 (H) 11/04/2021     Chemistry      Component Value Date/Time   NA 136 11/04/2021 0927   NA 140 02/14/2021 0802   K 4.4 11/04/2021 0927   CL 105 11/04/2021 0927   CO2 19 11/04/2021 0927   BUN 46 (H) 11/04/2021 0927   BUN 55 (H) 02/14/2021 0802   CREATININE 1.83 (H) 11/04/2021 0927      Component Value Date/Time   CALCIUM 9.0 11/04/2021 0927   ALKPHOS 36 (L) 04/20/2020 2013   AST  24 04/20/2020 2013   ALT 14 04/20/2020 2013   BILITOT 0.6 04/20/2020 2013          Assessment and Plan: 88 y.o. female with left knee pain and effusion.  Pain thought to be due to either exacerbation of DJD or gout flare.  Unfortunately her gout is not well controlled because of her renal function limiting medicine choice. Today plan for steroid injection.  We know that is going to increase her blood sugar but not a lot of choices are available to [place] today.  Additionally we will work  on authorization for hyaluronic acid injections as neck step if steroid injection does not provide good lasting benefit.   PDMP not reviewed this encounter. Orders Placed This Encounter  Procedures   [place] LIMITED JOINT SPACE STRUCTURES LOW LEFT(NO LINKED CHARGES)    Standing Status:   Future    Number of Occurrences:   1    Standing Expiration Date:   07/08/2022    Order Specific Question:   Reason for Exam (SYMPTOM  OR DIAGNOSIS REQUIRED)    Answer:   left knee pain    Order Specific Question:   Preferred imaging location?    Answer:   [place]   No orders of the defined types were placed in this encounter.    Discussed warning signs or symptoms. Please see discharge instructions. Patient expresses understanding.   The above documentation has been reviewed and is accurate and complete [name], M.D.

## 2022-01-08 ENCOUNTER — Ambulatory Visit: Payer: Medicare PPO | Admitting: Family Medicine

## 2022-01-08 ENCOUNTER — Other Ambulatory Visit: Payer: Self-pay

## 2022-01-08 ENCOUNTER — Ambulatory Visit: Payer: Self-pay

## 2022-01-08 VITALS — BP 158/86 | HR 80 | Ht 68.0 in

## 2022-01-08 DIAGNOSIS — M1A3620 Chronic gout due to renal impairment, left knee, without tophus (tophi): Secondary | ICD-10-CM

## 2022-01-08 DIAGNOSIS — M25562 Pain in left knee: Secondary | ICD-10-CM

## 2022-01-08 DIAGNOSIS — G8929 Other chronic pain: Secondary | ICD-10-CM

## 2022-01-08 NOTE — Patient Instructions (Addendum)
Thank you for coming in today.   You received a steroid injection today. Seek immediate medical attention if the joint becomes red, extremely painful, or is oozing fluid.   We will work on authorizing the gel shots for you. You will get a call from my office once we get approval.

## 2022-01-14 ENCOUNTER — Telehealth: Payer: Self-pay

## 2022-01-14 NOTE — Telephone Encounter (Signed)
Pt was called to inform her we were successful in getting approval for Ortho/Monovisc for her L knee. Pt was instructed to call the office to get scheduled to start the gel shots whenever her knee started hurting again. Pt verbalized understanding.

## 2022-01-15 DIAGNOSIS — L97512 Non-pressure chronic ulcer of other part of right foot with fat layer exposed: Secondary | ICD-10-CM | POA: Diagnosis not present

## 2022-01-29 DIAGNOSIS — L97512 Non-pressure chronic ulcer of other part of right foot with fat layer exposed: Secondary | ICD-10-CM | POA: Diagnosis not present

## 2022-02-10 ENCOUNTER — Ambulatory Visit: Payer: Medicare HMO | Admitting: Family Medicine

## 2022-02-10 ENCOUNTER — Other Ambulatory Visit: Payer: Self-pay

## 2022-02-10 ENCOUNTER — Ambulatory Visit: Payer: Self-pay

## 2022-02-10 VITALS — BP 148/88 | HR 91 | Ht 68.0 in

## 2022-02-10 DIAGNOSIS — M25531 Pain in right wrist: Secondary | ICD-10-CM

## 2022-02-10 DIAGNOSIS — M25562 Pain in left knee: Secondary | ICD-10-CM

## 2022-02-10 DIAGNOSIS — M109 Gout, unspecified: Secondary | ICD-10-CM | POA: Diagnosis not present

## 2022-02-10 DIAGNOSIS — M1712 Unilateral primary osteoarthritis, left knee: Secondary | ICD-10-CM

## 2022-02-10 DIAGNOSIS — G8929 Other chronic pain: Secondary | ICD-10-CM | POA: Diagnosis not present

## 2022-02-10 MED ORDER — COLCHICINE 0.6 MG PO TABS: 0.3000 mg | ORAL_TABLET | Freq: Every day | ORAL | 2 refills | Status: DC | PRN

## 2022-02-10 NOTE — Progress Notes (Signed)
I, [name], LAT, ATC, am serving as scribe for Dr. [name].  Krista Rosales is a 88 y.o. female who presents to [place] at [facility] today for f/u of multiple c/o including L knee and R wrist/hand pain.  She was last seen by Dr. Georgina Snell on 01/08/22 and had a L knee steroid injection.  She was previously prescribed colchicine and prednisone but is not currently taking either of those medications.  Today, pt reports her insurance denied covering the colchicine. Pt c/o pain around the L patella. Pt also c/o increased pain and swelling R hand, fingers, esp 2nd finger. Pt notes she was in so much pain last night she couldn't sleep.  Diagnostic testing: R wrist XR- 08/14/21; R hand XR- 06/07/21; L knee XR- 03/30/20  Pertinent review of systems: No fevers or chills  Relevant historical information: Gout and CKD   Exam:  BP (!) 148/88    Pulse 91    Ht 5\' 8"  (1.727 m)    SpO2 95%    BMI 23.63 kg/m  General: Well Developed, well nourished, and in no acute distress.   MSK: Right wrist and hand significant wrist effusion and swelling across her hand.  Tender palpation dorsal wrist and MCPs and PIPs.  Left knee large effusion tender palpation decreased knee motion.    Lab and Radiology Results  Procedure: Real-time Ultrasound Guided Injection of right wrist dorsal radiocarpal joint Device: Philips Affiniti 50G Images permanently stored and available for review in PACS Verbal informed consent obtained.  Discussed risks and benefits of procedure. Warned about infection bleeding damage to structures skin hypopigmentation and fat atrophy among others. Patient expresses understanding and agreement Time-out conducted.   Noted no overlying erythema, induration, or other signs of local infection.   Skin prepped in a sterile fashion.   Local anesthesia: Topical Ethyl chloride.   With sterile technique and under real time ultrasound guidance: 40 mg of Kenalog and 1 mL  lidocaine injected into the wrist joint. Fluid seen entering the joint capsule.   Completed without difficulty   Pain immediately resolved suggesting accurate placement of the medication.   Advised to call if fevers/chills, erythema, induration, drainage, or persistent bleeding.   Images permanently stored and available for review in the ultrasound unit.  Impression: Technically successful ultrasound guided injection.   Orthovisc injection left knee 1/3  Procedure: Real-time Ultrasound Guided Injection of left knee superior lateral patellar space Device: Philips Affiniti 50G Images permanently stored and available for review in PACS Verbal informed consent obtained.  Discussed risks and benefits of procedure. Warned about infection bleeding damage to structures skin hypopigmentation and fat atrophy among others. Patient expresses understanding and agreement Time-out conducted.   Noted no overlying erythema, induration, or other signs of local infection.   Skin prepped in a sterile fashion.   Local anesthesia: Topical Ethyl chloride.   With sterile technique and under real time ultrasound guidance: Orthovisc 30 mg injected into the joint. Fluid seen entering the joint capsule.   Completed without difficulty    Advised to call if fevers/chills, erythema, induration, drainage, or persistent bleeding.   Images permanently stored and available for review in the ultrasound unit.  Impression: Technically successful ultrasound guided injection. Lot number: [id number]    Lab Results  Component Value Date   LABURIC 8.6 (H) 11/04/2021       Assessment and Plan: 88 y.o. female with left knee pain thought to be a combination of exacerbation of DJD  and possibly a gout flare.  Plan to treat with Orthovisc series starting now.  She last had a steroid injection recently.  We will treat gout as below.  Check back in 1 week to proceed with Orthovisc injection #2/3  Right hand and wrist pain and swelling  thought to be due to gout exacerbation.  Plan for steroid injection today.  She has been unable to get her colchicine filled as of last year because her dose due to her renal dysfunction is 0.3 mg daily and her insurance only wanted to pay for the capsules.    PDMP not reviewed this encounter. Orders Placed This Encounter  Procedures   [place] LIMITED JOINT SPACE STRUCTURES LOW LEFT(NO LINKED CHARGES)    Standing Status:   Future    Number of Occurrences:   1    Standing Expiration Date:   08/10/2022    Order Specific Question:   Reason for Exam (SYMPTOM  OR DIAGNOSIS REQUIRED)    Answer:   left knee pain    Order Specific Question:   Preferred imaging location?    Answer:   [place]   Meds ordered this encounter  Medications   colchicine 0.6 MG tablet    Sig: Take 0.5 tablets (0.3 mg total) by mouth daily as needed (gout or psuedogout pain).    Dispense:  15 tablet    Refill:  2     Discussed warning signs or symptoms. Please see discharge instructions. Patient expresses understanding.   The above documentation has been reviewed and is accurate and complete [name], M.D.

## 2022-02-10 NOTE — Patient Instructions (Addendum)
Thank you for coming in today.   You received a steroid injection in your right wrist and an Orthovisc injection in your left knee today. Seek immediate medical attention if the joint becomes red, extremely painful, or is oozing fluid.   Recheck back in 1 week to f/u on wrist/hand pain and for your 2nd Orthovisc injection

## 2022-02-14 NOTE — Progress Notes (Unsigned)
° °  I, [name], LAT, ATC, am serving as scribe for Dr. [name].  Krista Rosales is a 88 y.o. female who presents to [place] at [facility] today for f/u of R wrist/hand pain and for L knee Orthovisc injection 2/3.  She was last seen by Dr. Georgina Snell on 02/10/22 for both and had a R wrist steroid injection and a L knee Orthovisc injection.  She was also prescribed colchicine.  Today, pt reports   Diagnostic testing: R wrist XR- 08/14/21; R hand XR- 06/07/21; L knee XR- 03/30/20 Pertinent review of systems: ***  Relevant historical information: ***   Exam:  There were no vitals taken for this visit. General: Well Developed, well nourished, and in no acute distress.   MSK: ***    Lab and Radiology Results No results found for this or any previous visit (from the past 72 hour(s)). No results found.     Assessment and Plan: 88 y.o. female with ***   PDMP not reviewed this encounter. No orders of the defined types were placed in this encounter.  No orders of the defined types were placed in this encounter.    Discussed warning signs or symptoms. Please see discharge instructions. Patient expresses understanding.   ***

## 2022-02-17 ENCOUNTER — Ambulatory Visit: Payer: Medicare HMO | Admitting: Family Medicine

## 2022-02-17 DIAGNOSIS — G8929 Other chronic pain: Secondary | ICD-10-CM

## 2022-02-18 ENCOUNTER — Ambulatory Visit: Payer: Medicare HMO | Admitting: Family Medicine

## 2022-02-18 ENCOUNTER — Other Ambulatory Visit: Payer: Self-pay

## 2022-02-18 ENCOUNTER — Encounter: Payer: Self-pay | Admitting: Family Medicine

## 2022-02-18 ENCOUNTER — Ambulatory Visit: Payer: Self-pay

## 2022-02-18 VITALS — BP 150/52 | HR 48 | Ht 68.0 in | Wt 159.2 lb

## 2022-02-18 DIAGNOSIS — M1712 Unilateral primary osteoarthritis, left knee: Secondary | ICD-10-CM | POA: Diagnosis not present

## 2022-02-18 DIAGNOSIS — M25562 Pain in left knee: Secondary | ICD-10-CM

## 2022-02-18 DIAGNOSIS — G8929 Other chronic pain: Secondary | ICD-10-CM

## 2022-02-18 DIAGNOSIS — M109 Gout, unspecified: Secondary | ICD-10-CM

## 2022-02-18 MED ORDER — ALLOPURINOL 100 MG PO TABS: 50.0000 mg | ORAL_TABLET | Freq: Every day | ORAL | 1 refills | Status: DC

## 2022-02-18 NOTE — Progress Notes (Signed)
I, [name], LAT, ATC, am serving as scribe for Dr. [name].  Krista Rosales is a 88 y.o. female who presents to [place] at [facility] today for L knee Orthovisc 2/3 and for f/u of R wrist/hand pain.  She was last seen by Dr. Georgina Snell on 02/10/22 and reported increased R hand pain and swelling.  She had a R dorsal wrist steroid injection and a L knee Orthovisc injection.  She was also re-prescribed colchicine.  Today, pt reports  that her L knee and R wrist/hand are feeling better.  She is taking the colchicine.  Diagnostic testing: R wrist XR- 08/14/21; R hand XR- 06/07/21; L knee XR- 03/30/20 Pertinent review of systems: no fever or chills  Relevant historical information: Chronic gout   Exam:  BP (!) 150/52 (BP Location: Left Arm, Patient Position: Sitting, Cuff Size: Normal)    Pulse (!) 48    Ht 5\' 8"  (1.727 m)    Wt 159 lb 3.2 oz (72.2 kg)    SpO2 96%    BMI 24.21 kg/m  General: Well Developed, well nourished, and in no acute distress.   MSK: Right wrist no joint effusion. Right index finger still slightly swollen at MCP and PIP.  Mildly tender palpation with slight decreased range of motion.  Left knee mild joint effusion.  Normal motion.    Lab and Radiology Results  Left knee Orthovisc 2/3 Procedure: Real-time Ultrasound Guided Injection of left knee superior lateral patellar space Device: Philips Affiniti 50G Images permanently stored and available for review in PACS Verbal informed consent obtained.  Discussed risks and benefits of procedure. Warned about infection bleeding damage to structures skin hypopigmentation and fat atrophy among others. Patient expresses understanding and agreement Time-out conducted.   Noted no overlying erythema, induration, or other signs of local infection.   Skin prepped in a sterile fashion.   Local anesthesia: Topical Ethyl chloride.   With sterile technique and under real time ultrasound guidance: Orthovisc 30  mg injected into the knee joint. Fluid seen entering the joint capsule.   Completed without difficulty     Advised to call if fevers/chills, erythema, induration, drainage, or persistent bleeding.   Images permanently stored and available for review in the ultrasound unit.  Impression: Technically successful ultrasound guided injection.  Lot number: [id number]  Return in 1 week for Orthovisc injection left knee 3/3  Assessment and Plan: 88 y.o. female with  Gout.  Seems to be improved now on colchicine 0.3 mg daily.  She mentions that she is not currently taking the allopurinol.  I have prescribed it again and hopefully she will be able to get it.  Plan on rechecking this issue in 1 week and if needed I could inject her index finger.  As for her left knee pain plan for Orthovisc injection left knee 2/3 today.   PDMP not reviewed this encounter. Orders Placed This Encounter  Procedures   [place] LIMITED JOINT SPACE STRUCTURES LOW LEFT(NO LINKED CHARGES)    Order Specific Question:   Reason for Exam (SYMPTOM  OR DIAGNOSIS REQUIRED)    Answer:   knee inj    Order Specific Question:   Preferred imaging location?    Answer:   [place]   Meds ordered this encounter  Medications   allopurinol (ZYLOPRIM) 100 MG tablet    Sig: Take 0.5 tablets (50 mg total) by mouth daily.    Dispense:  30 tablet    Refill:  1     Discussed warning signs or symptoms. Please see discharge instructions. Patient expresses understanding.   The above documentation has been reviewed and is accurate and complete [name], M.D.

## 2022-02-18 NOTE — Patient Instructions (Addendum)
Good to see you today.  You had a L knee Orthovisc injection (2/3).  Call or go to the ER if you develop a large red swollen joint with extreme pain or oozing puss.   Follow-up: next week for your final L knee Orthovisc injection and to f/u on R index finger

## 2022-02-19 DIAGNOSIS — L97512 Non-pressure chronic ulcer of other part of right foot with fat layer exposed: Secondary | ICD-10-CM | POA: Diagnosis not present

## 2022-02-24 NOTE — Progress Notes (Signed)
? ?I, [name], LAT, ATC, am serving as scribe for Dr. [name]. ? ?Krista Rosales is a 88 y.o. female who presents to [place] at [facility] today for f/u of R index finger pain and to get her 3rd L knee Orthovisc injection.  She was last seen by Dr. Georgina Snell on 02/18/22 and had a L knee Orthovisc injection #2.  She noted that her R wrist was feeling better and that she was taking her colchicine (0.'3mg'$ ).  She was also re-prescribed allopurinol.  Today, pt reports L wrist is somewhat better. Pt notes a "tightness" in her hand/fingers, esp the 2nd finger w/ swelling. Pt has not been taking the allopurinol due to lack of insurance coverage. ? ?Diagnostic testing: R wrist XR- 08/14/21; R hand XR- 06/07/21; L knee XR- 03/30/20 ? ? ?Pertinent review of systems: No fevers or chills ? ?Relevant historical information: Diabetes and gout ? ? ?Exam:  ?BP 140/80   Pulse 71   Ht '5\' 8"'$  (1.727 m)   SpO2 97%   BMI 24.21 kg/m?  ?General: Well Developed, well nourished, and in no acute distress.  ? ?MSK: Right hand: Swollen second PIP. ?Right wrist mild swelling. ?Decreased motion hand and wrist.  Mild tender palpation right second PIP and mild tender to palpation wrist ? ?Left knee mild joint effusion ? ? ? ?Lab and Radiology Results ? ?Orthovisc injection left knee 3/3 ?Procedure: Real-time Ultrasound Guided Injection of left knee superior lateral patellar space ?Device: Philips Affiniti 50G ?Images permanently stored and available for review in PACS ?Verbal informed consent obtained.  Discussed risks and benefits of procedure. Warned about infection bleeding damage to structures skin hypopigmentation and fat atrophy among others. ?Patient expresses understanding and agreement ?Time-out conducted.   ?Noted no overlying erythema, induration, or other signs of local infection.   ?Skin prepped in a sterile fashion.   ?Local anesthesia: Topical Ethyl chloride.   ?With sterile technique and under real time  ultrasound guidance: Orthovisc 30 mg injected into knee joint. Fluid seen entering the joint capsule.   ?Completed without difficulty    ?Advised to call if fevers/chills, erythema, induration, drainage, or persistent bleeding.   ?Images permanently stored and available for review in the ultrasound unit.  ?Impression: Technically successful ultrasound guided injection. ? ?Lot number: [id number] ? ? ? ? ? ? ?Assessment and Plan: ?88 y.o. female with right wrist and hand pain thought to be due to gout and DJD.  On colchicine.  This has improved with colchicine and steroid injection into the right wrist.  However she has not yet been able to get her allopurinol.  She thinks its not approved by her insurance but has not tried it again at her local pharmacy recently.  Allopurinol is cheap and I do not see why it could not be approved.  I have sent it to her local pharmacy once but after talking to her I think it is can to be easier for her if I do send it to her mail order pharmacy. ? ?Plan to check back as needed.  If not better would proceed with injection into that right second PIP. ? ?As for her left knee she completed her Orthovisc injection series today.  Recheck back as needed. ? ? ?PDMP not reviewed this encounter. ?Orders Placed This Encounter  ?Procedures  ? [place] LIMITED JOINT SPACE STRUCTURES LOW LEFT(NO LINKED CHARGES)  ?  Order Specific Question:   Reason for Exam (SYMPTOM  OR DIAGNOSIS REQUIRED)  ?  Answer:   L knee pain  ?  Order Specific Question:   Preferred imaging location?  ?  Answer:   [place]  ? ?Meds ordered this encounter  ?Medications  ? allopurinol (ZYLOPRIM) 100 MG tablet  ?  Sig: Take 0.5 tablets (50 mg total) by mouth daily.  ?  Dispense:  45 tablet  ?  Refill:  1  ? ? ? ?Discussed warning signs or symptoms. Please see discharge instructions. Patient expresses understanding. ? ? ?The above documentation has been reviewed and is accurate and complete [name], M.D. ? ? ?

## 2022-02-25 ENCOUNTER — Ambulatory Visit: Payer: Self-pay

## 2022-02-25 ENCOUNTER — Other Ambulatory Visit: Payer: Self-pay

## 2022-02-25 ENCOUNTER — Ambulatory Visit (INDEPENDENT_AMBULATORY_CARE_PROVIDER_SITE_OTHER): Payer: Medicare HMO | Admitting: Family Medicine

## 2022-02-25 VITALS — BP 140/80 | HR 71 | Ht 68.0 in

## 2022-02-25 DIAGNOSIS — M1712 Unilateral primary osteoarthritis, left knee: Secondary | ICD-10-CM | POA: Diagnosis not present

## 2022-02-25 DIAGNOSIS — M1A3620 Chronic gout due to renal impairment, left knee, without tophus (tophi): Secondary | ICD-10-CM

## 2022-02-25 DIAGNOSIS — M25531 Pain in right wrist: Secondary | ICD-10-CM

## 2022-02-25 DIAGNOSIS — M25562 Pain in left knee: Secondary | ICD-10-CM

## 2022-02-25 DIAGNOSIS — G8929 Other chronic pain: Secondary | ICD-10-CM

## 2022-02-25 MED ORDER — ALLOPURINOL 100 MG PO TABS: 50.0000 mg | ORAL_TABLET | Freq: Every day | ORAL | 1 refills | Status: DC

## 2022-02-25 NOTE — Patient Instructions (Addendum)
Thank you for coming in today.  ? ?You received the 3rd Orthovisc injection in your left knee today. Seek immediate medical attention if the joint becomes red, extremely painful, or is oozing fluid.  ? ?I've sent a prescription for allopurinol to your mail order pharmacy.  ?Recheck back as needed ?

## 2022-03-05 DIAGNOSIS — L97512 Non-pressure chronic ulcer of other part of right foot with fat layer exposed: Secondary | ICD-10-CM | POA: Diagnosis not present

## 2022-03-11 ENCOUNTER — Encounter (HOSPITAL_COMMUNITY): Payer: Self-pay

## 2022-03-11 ENCOUNTER — Ambulatory Visit (HOSPITAL_COMMUNITY)
Admission: EM | Admit: 2022-03-11 | Discharge: 2022-03-11 | Disposition: A | Discharge status: Home / Self Care | Payer: Medicare HMO | Attending: Internal Medicine | Admitting: Internal Medicine

## 2022-03-11 DIAGNOSIS — B354 Tinea corporis: Secondary | ICD-10-CM

## 2022-03-11 DIAGNOSIS — L853 Xerosis cutis: Secondary | ICD-10-CM

## 2022-03-11 MED ORDER — TERBINAFINE HCL 250 MG PO TABS: 250.0000 mg | ORAL_TABLET | Freq: Every day | ORAL | 0 refills | Status: DC

## 2022-03-11 MED ORDER — DIPHENHYDRAMINE HCL 25 MG PO TABS: 25.0000 mg | ORAL_TABLET | Freq: Three times a day (TID) | ORAL | 0 refills | Status: AC | PRN

## 2022-03-11 NOTE — Discharge Instructions (Addendum)
Please take medications as prescribed ?Please apply Vaseline based ointment on your skin ?If symptoms persist please return to urgent care to be reevaluated. ?

## 2022-03-11 NOTE — ED Provider Notes (Signed)
?[place] ? ? ? ?CSN: [id number] ?Arrival date & time: 03/11/22  1300 ? ? ?  ? ?History   ?Chief Complaint ?Chief Complaint  ?Patient presents with  ? Pruritis  ? ? ?HPI ?Krista Rosales is a 88 y.o. female comes to the urgent care with generalized itching of 1 week duration.  Patient had procedure done on her right foot last week following that patient has experienced generalized itching.  No changes in medication.  No changes in cosmetics, soaps or body lotions.  Patient has tried hydroxyzine with no improvement in his symptoms.  She also complains of a rash on the right leg.  Rash is pruritic and seems to be spreading up her leg.  No redness.  No shortness of breath, tongue swelling or wheezing. ?HPI ? ?Past Medical History:  ?Diagnosis Date  ? ANEMIA, IRON DEFICIENCY 05/07/2009  ? Qualifier: Diagnosis of  By: Loanne Drilling MD, Jacelyn Pi   ? ARTHRITIS 05/30/2008  ? Qualifier: Diagnosis of  By: Marland Mcalpine    ? DIABETES MELLITUS, TYPE II 07/16/2007  ? Qualifier: Diagnosis of  By: Reatha Armour, Lucy    ? Diastolic congestive heart failure ([place]) 09/2017  ? DIVERTICULOSIS, COLON 05/30/2008  ? Qualifier: Diagnosis of  By: Marland Mcalpine    ? Dyspnea   ? Glaucoma   ? HYPERLIPIDEMIA 07/16/2007  ? Qualifier: Diagnosis of  By: Reatha Armour, Lucy    ? Hypertension   ? Normocytic anemia   ? ? ?Patient Active Problem List  ? Diagnosis Date Noted  ? Chest pain 11/20/2017  ? Diastolic congestive heart failure ([place]) 09/30/2017  ? Headache   ? Hypertensive urgency   ? Hypokalemia   ? Bradycardia   ? Normocytic anemia   ? Chronic diastolic CHF (congestive heart failure) ([place])   ? Other emphysema ([place])   ? Glaucoma   ? Hypertension 02/20/2015  ? Malignant hypertension 02/20/2015  ? ANEMIA, IRON DEFICIENCY 05/07/2009  ? DYSPNEA 05/07/2009  ? CONSTIPATION 06/01/2008  ? HEMORRHOIDS 05/30/2008  ? DIVERTICULOSIS, COLON 05/30/2008  ? ARTHRITIS 05/30/2008  ? COUGH 02/03/2008  ? Diabetes mellitus type 2 with complications ([place])  48/25/0037  ? HYPERLIPIDEMIA 07/16/2007  ? Essential hypertension 07/16/2007  ? Coronary atherosclerosis 07/16/2007  ? ALLERGIC RHINITIS 07/16/2007  ? OSTEOPOROSIS 07/16/2007  ? ? ?Past Surgical History:  ?Procedure Laterality Date  ? ABDOMINAL AORTOGRAM W/LOWER EXTREMITY N/A 10/23/2021  ? Procedure: ABDOMINAL AORTOGRAM W/LOWER EXTREMITY;  Surgeon: Broadus John, MD;  Location: [facility];  Service: Cardiovascular;  Laterality: N/A;  ? ABDOMINAL HYSTERECTOMY    ? ? ?OB History   ?No obstetric history on file. ?  ? ? ? ?Home Medications   ? ?Prior to Admission medications   ?Medication Sig Start Date End Date Taking? Authorizing Provider  ?diphenhydrAMINE (BENADRYL) 25 MG tablet Take 1 tablet (25 mg total) by mouth every 8 (eight) hours as needed for itching. 03/11/22  Yes Marquise Wicke, Myrene Galas, MD  ?terbinafine (LAMISIL) 250 MG tablet Take 1 tablet (250 mg total) by mouth daily. 03/11/22  Yes Raedyn Klinck, Myrene Galas, MD  ?allopurinol (ZYLOPRIM) 100 MG tablet Take 0.5 tablets (50 mg total) by mouth daily. 02/25/22   Gregor Hams, MD  ?ALPRAZolam Duanne Moron) 0.5 MG tablet Take 0.25 mg by mouth 2 (two) times daily. 09/07/17   [provider]  ?amLODipine (NORVASC) 10 MG tablet Take 1 tablet (10 mg total) by mouth daily. 03/15/21   Lelon Perla, MD  ?aspirin  EC 81 MG tablet Take 81 mg by mouth daily. Swallow whole.    [provider]  ?Blood Glucose Monitoring Suppl (TRUE METRIX METER) w/Device KIT  05/24/21   [provider]  ?cholecalciferol (VITAMIN D) 1000 UNITS tablet Take 2,000 Units by mouth daily.     [provider]  ?colchicine 0.6 MG tablet Take 0.5 tablets (0.3 mg total) by mouth daily as needed (gout or psuedogout pain). 02/10/22   Gregor Hams, MD  ?diclofenac Sodium (VOLTAREN) 1 % GEL Apply 4 g topically 4 (four) times daily. To affected joint. 06/27/20   Gregor Hams, MD  ?doxazosin (CARDURA) 2 MG tablet TAKE 1 TABLET(2 MG) BY MOUTH DAILY 03/15/21   Lelon Perla, MD   ?hydrALAZINE (APRESOLINE) 100 MG tablet Take 1 tablet (100 mg total) by mouth 3 (three) times daily. 03/15/21   Lelon Perla, MD  ?Lancets [facility] DELICA PLUS [id number]) MISC Apply 1 each topically 3 (three) times daily. 07/18/20   [provider]  ?magnesium hydroxide (MILK OF MAGNESIA) 400 MG/5ML suspension Take 15 mLs by mouth daily as needed for mild constipation.    [provider]  ?mupirocin ointment (BACTROBAN) 2 % Apply topically 2 (two) times daily. 09/09/21   [provider]  ?Roma Schanz test strip 1 each 3 (three) times daily. 07/17/20   [provider]  ?SANTYL ointment Apply topically daily. 09/17/21   [provider]  ?furosemide (LASIX) 20 MG tablet One tablet once daily as needed for swelling ?Patient taking differently: Take 20 mg by mouth See admin instructions. Takes 1 tablet Tuesday,Thursday, Saturday and Sunday and 2 tablets on Monday, Wednesday and Friday. 11/15/18 03/11/21  Lelon Perla, MD  ?loratadine (CLARITIN) 5 MG/5ML syrup Take 5 mLs (5 mg total) by mouth daily. 04/18/19 03/11/21  Wieters, Hallie C, PA-C  ?omeprazole (PRILOSEC) 40 MG capsule Take 40 mg by mouth daily. 09/12/20 03/11/21  [provider]  ? ? ?Family History ?Family History  ?Problem Relation Age of Onset  ? Diabetes Mellitus II Other   ? Pancreatic cancer Mother   ? Heart disease Father   ? CAD Brother   ? ? ?Social History ?Social History  ? ?Tobacco Use  ? Smoking status: Former  ? Smokeless tobacco: Never  ?Vaping Use  ? Vaping Use: Never used  ?Substance Use Topics  ? Alcohol use: No  ? Drug use: No  ? ? ? ?Allergies   ?Wellbutrin [bupropion], Codeine, and Penicillins ? ? ?Review of Systems ?Review of Systems  ?Cardiovascular: Negative.   ?Gastrointestinal: Negative.   ?Musculoskeletal: Negative.   ?Skin:  Positive for rash. Negative for color change and wound.  ?Neurological: Negative.   ?Psychiatric/Behavioral: Negative.    ? ? ?Physical Exam ?Triage  Vital Signs ?ED Triage Vitals  ?Enc Vitals Group  ?   BP 03/11/22 1433 (!) 123/54  ?   Pulse Rate 03/11/22 1432 (!) 51  ?   Resp 03/11/22 1432 19  ?   Temp 03/11/22 1432 99.1 ?F (37.3 ?C)  ?   Temp src --   ?   SpO2 03/11/22 1432 98 %  ?   Weight --   ?   Height --   ?   Head Circumference --   ?   Peak Flow --   ?   Pain Score 03/11/22 1431 0  ?   Pain Loc --   ?   Pain Edu? --   ?   Excl. in  GC? --   ? ?No data found. ? ?Updated Vital Signs ?BP (!) 123/54   Pulse (!) 51   Temp 99.1 ?F (37.3 ?C)   Resp 19   SpO2 98%  ? ?Visual Acuity ?Right Eye Distance:   ?Left Eye Distance:   ?Bilateral Distance:   ? ?Right Eye Near:   ?Left Eye Near:    ?Bilateral Near:    ? ?Physical Exam ?Constitutional:   ?   Appearance: Normal appearance.  ?Musculoskeletal:     ?   General: Normal range of motion.  ?   Cervical back: Normal range of motion and neck supple.  ?Skin: ?   Comments: Dry skin with no rash.  Right leg has a rash over the lateral malleolus.  The rash over the lateral malleolus measures about 3 inches in the longest diameter.  There is central clearing.  No erythema.  No discharge.  ?Neurological:  ?   Mental Status: She is alert.  ? ? ? ?UC Treatments / Results  ?Labs ?(all labs ordered are listed, but only abnormal results are displayed) ?Labs Reviewed - No data to display ? ?EKG ? ? ?Radiology ?No results found. ? ?Procedures ?Procedures (including critical care time) ? ?Medications Ordered in UC ?Medications - No data to display ? ?Initial Impression / Assessment and Plan / UC Course  ?I have reviewed the triage vital signs and the nursing notes. ? ?Pertinent labs & imaging results that were available during my care of the patient were reviewed by me and considered in my medical decision making (see chart for details). ? ?  ? ?1.  Dry skin dermatitis: ?Patient is advised to apply Vaseline paste moisturizer on her skin to help with dry skin since this will eventually help with the itching ?Benadryl as needed  for itching ? ?2.  Tinea corporis: ?Lamisil 250 mg orally daily for 10 days ?Return to urgent care if symptoms worsen ? ?3.  Sinus bradycardia without symptoms: ?EKG shows sinus bradycardia.  Lowest heart rate was 48 bpm

## 2022-03-11 NOTE — ED Triage Notes (Addendum)
Pt presents with complaints of itching that started on her head and has traveled down her back x 1 week. Pt just recently had an "ullcer" taken off her foot.  Denies relief with hydroxyzine. ?

## 2022-03-11 NOTE — ED Notes (Signed)
Pt heart rate was 48 bpm at discharge. Ekg completed and given to dr lamptey.  ?

## 2022-03-19 DIAGNOSIS — L97512 Non-pressure chronic ulcer of other part of right foot with fat layer exposed: Secondary | ICD-10-CM | POA: Diagnosis not present

## 2022-03-27 DIAGNOSIS — L299 Pruritus, unspecified: Secondary | ICD-10-CM | POA: Diagnosis not present

## 2022-04-02 DIAGNOSIS — L97512 Non-pressure chronic ulcer of other part of right foot with fat layer exposed: Secondary | ICD-10-CM | POA: Diagnosis not present

## 2022-04-04 DIAGNOSIS — E113593 Type 2 diabetes mellitus with proliferative diabetic retinopathy without macular edema, bilateral: Secondary | ICD-10-CM | POA: Diagnosis not present

## 2022-04-04 DIAGNOSIS — H43813 Vitreous degeneration, bilateral: Secondary | ICD-10-CM | POA: Diagnosis not present

## 2022-04-04 DIAGNOSIS — H35373 Puckering of macula, bilateral: Secondary | ICD-10-CM | POA: Diagnosis not present

## 2022-04-04 DIAGNOSIS — H35362 Drusen (degenerative) of macula, left eye: Secondary | ICD-10-CM | POA: Diagnosis not present

## 2022-04-16 DIAGNOSIS — L97512 Non-pressure chronic ulcer of other part of right foot with fat layer exposed: Secondary | ICD-10-CM | POA: Diagnosis not present

## 2022-04-30 DIAGNOSIS — L97511 Non-pressure chronic ulcer of other part of right foot limited to breakdown of skin: Secondary | ICD-10-CM | POA: Diagnosis not present

## 2022-05-02 DIAGNOSIS — E119 Type 2 diabetes mellitus without complications: Secondary | ICD-10-CM | POA: Diagnosis not present

## 2022-05-14 DIAGNOSIS — M722 Plantar fascial fibromatosis: Secondary | ICD-10-CM | POA: Diagnosis not present

## 2022-05-28 NOTE — [facility]regational Nurse Program (Signed)
Brief encounter at church, doing ok.    Vinnie Langton, RN

## 2022-06-02 DIAGNOSIS — E559 Vitamin D deficiency, unspecified: Secondary | ICD-10-CM | POA: Diagnosis not present

## 2022-06-02 DIAGNOSIS — I130 Hypertensive heart and chronic kidney disease with heart failure and stage 1 through stage 4 chronic kidney disease, or unspecified chronic kidney disease: Secondary | ICD-10-CM | POA: Diagnosis not present

## 2022-06-02 DIAGNOSIS — Z0000 Encounter for general adult medical examination without abnormal findings: Secondary | ICD-10-CM | POA: Diagnosis not present

## 2022-06-02 DIAGNOSIS — Z1331 Encounter for screening for depression: Secondary | ICD-10-CM | POA: Diagnosis not present

## 2022-06-02 DIAGNOSIS — I509 Heart failure, unspecified: Secondary | ICD-10-CM | POA: Diagnosis not present

## 2022-06-02 DIAGNOSIS — I700 Atherosclerosis of aorta: Secondary | ICD-10-CM | POA: Diagnosis not present

## 2022-06-02 DIAGNOSIS — M109 Gout, unspecified: Secondary | ICD-10-CM | POA: Diagnosis not present

## 2022-06-02 DIAGNOSIS — L97519 Non-pressure chronic ulcer of other part of right foot with unspecified severity: Secondary | ICD-10-CM | POA: Diagnosis not present

## 2022-06-02 DIAGNOSIS — I998 Other disorder of circulatory system: Secondary | ICD-10-CM | POA: Diagnosis not present

## 2022-06-02 DIAGNOSIS — E1142 Type 2 diabetes mellitus with diabetic polyneuropathy: Secondary | ICD-10-CM | POA: Diagnosis not present

## 2022-06-02 DIAGNOSIS — N184 Chronic kidney disease, stage 4 (severe): Secondary | ICD-10-CM | POA: Diagnosis not present

## 2022-07-22 DIAGNOSIS — L853 Xerosis cutis: Secondary | ICD-10-CM | POA: Diagnosis not present

## 2022-07-22 DIAGNOSIS — L298 Other pruritus: Secondary | ICD-10-CM | POA: Diagnosis not present

## 2022-07-22 DIAGNOSIS — L218 Other seborrheic dermatitis: Secondary | ICD-10-CM | POA: Diagnosis not present

## 2022-07-22 DIAGNOSIS — L728 Other follicular cysts of the skin and subcutaneous tissue: Secondary | ICD-10-CM | POA: Diagnosis not present

## 2022-08-05 DIAGNOSIS — L03031 Cellulitis of right toe: Secondary | ICD-10-CM | POA: Diagnosis not present

## 2022-08-05 DIAGNOSIS — L98491 Non-pressure chronic ulcer of skin of other sites limited to breakdown of skin: Secondary | ICD-10-CM | POA: Diagnosis not present

## 2022-08-19 DIAGNOSIS — M12271 Villonodular synovitis (pigmented), right ankle and foot: Secondary | ICD-10-CM | POA: Diagnosis not present

## 2022-09-25 DIAGNOSIS — L7211 Pilar cyst: Secondary | ICD-10-CM | POA: Diagnosis not present

## 2022-09-25 DIAGNOSIS — L218 Other seborrheic dermatitis: Secondary | ICD-10-CM | POA: Diagnosis not present

## 2022-10-02 DIAGNOSIS — L538 Other specified erythematous conditions: Secondary | ICD-10-CM | POA: Diagnosis not present

## 2022-10-02 DIAGNOSIS — D492 Neoplasm of unspecified behavior of bone, soft tissue, and skin: Secondary | ICD-10-CM | POA: Diagnosis not present

## 2022-10-02 DIAGNOSIS — L723 Sebaceous cyst: Secondary | ICD-10-CM | POA: Diagnosis not present

## 2022-10-09 DIAGNOSIS — H35362 Drusen (degenerative) of macula, left eye: Secondary | ICD-10-CM | POA: Diagnosis not present

## 2022-10-09 DIAGNOSIS — E113593 Type 2 diabetes mellitus with proliferative diabetic retinopathy without macular edema, bilateral: Secondary | ICD-10-CM | POA: Diagnosis not present

## 2022-10-09 DIAGNOSIS — H43813 Vitreous degeneration, bilateral: Secondary | ICD-10-CM | POA: Diagnosis not present

## 2022-10-09 DIAGNOSIS — H35373 Puckering of macula, bilateral: Secondary | ICD-10-CM | POA: Diagnosis not present

## 2022-10-11 ENCOUNTER — Other Ambulatory Visit: Payer: Self-pay | Admitting: Family Medicine

## 2022-10-13 NOTE — Telephone Encounter (Signed)
Rx refill request approved per Dr. Corey's orders. 

## 2022-10-16 NOTE — Progress Notes (Deleted)
Cardiology Office Note:    Date:  10/16/2022   ID:  Krista Rosales, DOB Jan 19, 1934, MRN [id number]  PCP:  Josetta Huddle, MD  Cardiologist:  Kirk Ruths, MD  Electrophysiologist:  None   Referring MD: Josetta Huddle, MD   Chief Complaint: follow-up of bradycardia and hypertension  History of Present Illness:    Krista Rosales is a 88 y.o. female with a history of bradycardia with Wenckebach and multiple pauses noted on monitor in 07/2955, chronic diastolic CHF, PAD, hypertension, hyperlipidemia, and anemia who is followed by Dr. Stanford Breed and presents today for routine follow-up of bradycardia and hypertension.   Myoview in 12/2017 was low risk with no evidence of ischemia or infarction. Echo in 12/2018 showed LVEF of 55-60% with no regional wall motion abnormalities, moderate LVH, grade 1 diastolic dysfunction, and mild MR. Monitor in 06/2019 for further evaluation of symptomatic bradycardia and showed underlying sinus rhythm with average heart rate of 53 bpm, 1st degree AV block, intermittent 2nd degree Mobitz type 1 (Wenckebach), and multiple pauses with the longest one being 3.4 seconds. Clonidine was weaned off with improvement.   Patient was last seen by Dr. Stanford Breed in 08/2021 at which time she reported dyspnea with more vigorous activities but was otherwise doing well. Patient was referred to Dr. Unk Lightning in Vascular Surgery in 09/2021 for concern for PAD given unhealing wound on right foot. ABIs were ordered and showed non-compressible lower extremity arteries bilaterally. Therefore, he underwent peripheral angiogram in 10/2021 which showed diffuse microvascular disease at the level of the ankle and into the foot not amenable to endovascular intervention.  Patient presents today for follow-up. ***  Bradycardia  Intermittent Wenckebach Multiple Pauses Patient was reported some symptomatic bradycardia in 06/2019. Monitor was ordered and showed underlying sinus rhythm with average  heart rate of 53 bpm, 1st degree AV block, intermittent 2nd degree Mobitz type 1 (Wenckebach), and multiple pauses with the longest one being 3.4 seconds. Clonidine was weaned off with improvement.  - Asymptomatic. *** - Continue to avoid AV nodal agents.  - May require a pacemaker at some point in the future.   Chronic Diastolic CHF Echo in 01/1307 showed 55-60% with no regional wall motion abnormalities, moderate LVH, and grade 1 diastolic dysfunction. - Euvolemic on exam.  - No need for diuretics at this time. ***  Mild Mitral Regurgitation Noted on Echo in 12/2018.  - Will repeat Echo for routine monitoring of this. ***  PAD ABIs in 09/2021 showed non-compressible lower extremity arteries bilaterally. Peripheral angiogram in 10/2021 which showed diffuse microvascular disease at the level of the ankle and into the foot not amenable to endovascular intervention. - *** - Continue Aspirin '81mg'$  daily. - Does not appear to be on a statin. ***  Hypertension BP well controlled. *** - Continue current medications: Amlodipine '10mg'$  daily, Hydralazine '100mg'$  three times daily, and Cardura '2mg'$  daily.   Hyperlipidemia Most recent lipid panel in *** - Not currently on any statins. Will start Crestor '20mg'$  daily given PAD. *** - Will need repeat lipid panel and LFTs in 6-8 weeks. ***   Past Medical History:  Diagnosis Date   ANEMIA, IRON DEFICIENCY 05/07/2009   Qualifier: Diagnosis of  By: Loanne Drilling MD, Sean A    ARTHRITIS 05/30/2008   Qualifier: Diagnosis of  By: Houghton, TYPE II 07/16/2007   Qualifier: Diagnosis of  By: Marca Ancona RMA, Lucy     Diastolic congestive heart failure ([place]) 09/2017  DIVERTICULOSIS, COLON 05/30/2008   Qualifier: Diagnosis of  By: Marland Mcalpine     Dyspnea    Glaucoma    HYPERLIPIDEMIA 07/16/2007   Qualifier: Diagnosis of  By: Marca Ancona RMA, Lucy     Hypertension    Normocytic anemia     Past Surgical History:  Procedure Laterality  Date   ABDOMINAL AORTOGRAM W/LOWER EXTREMITY N/A 10/23/2021   Procedure: ABDOMINAL AORTOGRAM W/LOWER EXTREMITY;  Surgeon: Broadus John, MD;  Location: [facility];  Service: Cardiovascular;  Laterality: N/A;   ABDOMINAL HYSTERECTOMY      Current Medications: No outpatient medications have been marked as taking for the 10/23/22 encounter (Appointment) with Darreld Mclean, PA-C.     Allergies:   Wellbutrin [bupropion], Codeine, and Penicillins   Social History   Socioeconomic History   Marital status: Widowed    Spouse name: Not on file   Number of children: Not on file   Years of education: Not on file   Highest education level: Not on file  Occupational History   Not on file  Tobacco Use   Smoking status: Former   Smokeless tobacco: Never  Vaping Use   Vaping Use: Never used  Substance and Sexual Activity   Alcohol use: No   Drug use: No   Sexual activity: Never  Other Topics Concern   Not on file  Social History Narrative   Widowed.  No children.  Has a brother.     Social Determinants of Health   Financial Resource Strain: Not on file  Food Insecurity: Not on file  Transportation Needs: Not on file  Physical Activity: Not on file  Stress: Not on file  Social Connections: Not on file     Family History: The patient's family history includes CAD in her brother; Diabetes Mellitus II in an other family member; Heart disease in her father; Pancreatic cancer in her mother.  ROS:   Please see the history of present illness.     EKGs/Labs/Other Studies Reviewed:    The following studies were reviewed:  Myoview 12/29/2017: Nuclear stress EF: 46%. The left ventricular ejection fraction is mildly decreased (45-54%). There was no ST segment deviation noted during stress. The study is normal. This is a low risk study.   Normal pharmacologic nuclear stress test with no evidence for prior infarct or ischemia. LVEF appears visually better than calculated.   _______________  Echocardiogram 01/04/2019: Study Conclusions: - Left ventricle: The cavity size was normal. Wall thickness was    increased in a pattern of moderate LVH. Systolic function was    normal. The estimated ejection fraction was in the range of 55%    to 60%. Wall motion was normal; there were no regional wall    motion abnormalities. Doppler parameters are consistent with    abnormal left ventricular relaxation (grade 1 diastolic    dysfunction).  - Mitral valve: Calcified annulus. There was mild regurgitation.   Impressions: - Normal LV systolic function; moderate LVH; mild diastolic    dysfunction; mild MR.  _______________  Zio Monitor in 06/2019: Sinus bradycardia with first-degree AV block, normal sinus rhythm, sinus tachycardia, intermittent Mobitz 1 second-degree AV block, multiple pauses with longest being 3.4 seconds, occasional PAC and PVC. _______________  ABIs/TBIs 10/18/2021: Summary: Right: Resting right ankle-brachial index indicates noncompressible right  lower extremity arteries. The right toe-brachial index is abnormal.   Left: Resting left ankle-brachial index indicates noncompressible left  lower extremity arteries. The left toe-brachial index is  abnormal. _______________  Peripheral Angiogram 10/23/2021: Findings: Aortogram: Normal bilateral renal arteries, normal infrarenal abdominal aorta.  On the right: Normal common iliac artery, normal internal iliac artery, normal external iliac artery, normal common iliac artery, normal superficial femoral artery, normal profunda, normal popliteal artery.  Anterior tibial artery with inline flow to the ankle, then become atretic with diffuse collaterals.  Peroneal artery with inline flow to the ankle, giving off atretic medial and lateral perforators.  Posterior tibial artery occluded 3 cm from its ostia with no reconstitution in the foot.  Dorsalis pedis fills through collaterals.  No plantar arteries  appreciated.   On the left: Normal common iliac artery, normal internal iliac artery, normal external iliac artery, normal common femoral artery  Summary: Patient with diffuse microvascular disease at the level of the ankle and into the foot.  This is not amenable to endovascular intervention. Patient would benefit from continued wound care, pressure offloading on the lateral aspect of her right foot.              EKG:  EKG *** ordered today. EKG personally reviewed and demonstrates ***.  Recent Labs: 10/23/2021: Hemoglobin 10.2 11/04/2021: BUN 46; Creatinine, Ser 1.83; Potassium 4.4; Sodium 136  Recent Lipid Panel    Component Value Date/Time   CHOL 153 11/20/2017 1506   TRIG 66 11/20/2017 1506   HDL 40 (L) 11/20/2017 1506   CHOLHDL 3.8 11/20/2017 1506   VLDL 13 11/20/2017 1506   LDLCALC 100 (H) 11/20/2017 1506    Physical Exam:    Vital Signs: There were no vitals taken for this visit.    Wt Readings from Last 3 Encounters:  02/18/22 159 lb 3.2 oz (72.2 kg)  12/04/21 155 lb 6.4 oz (70.5 kg)  11/04/21 153 lb 9.6 oz (69.7 kg)     General: 88 y.o. female in no acute distress. HEENT: Normocephalic and atraumatic. Sclera clear. EOMs intact. Neck: Supple. No carotid bruits. No JVD. Heart: *** RRR. Distinct S1 and S2. No murmurs, gallops, or rubs. Radial and distal pedal pulses 2+ and equal bilaterally. Lungs: No increased work of breathing. Clear to ausculation bilaterally. No wheezes, rhonchi, or rales.  Abdomen: Soft, non-distended, and non-tender to palpation. Bowel sounds present in all 4 quadrants.  MSK: Normal strength and tone for age. *** Extremities: No lower extremity edema.    Skin: Warm and dry. Neuro: Alert and oriented x3. No focal deficits. Psych: Normal affect. Responds appropriately.   Assessment:    No diagnosis found.  Plan:     Disposition: Follow up in ***   Medication Adjustments/Labs and Tests Ordered: Current medicines are reviewed at  length with the patient today.  Concerns regarding medicines are outlined above.  No orders of the defined types were placed in this encounter.  No orders of the defined types were placed in this encounter.   There are no Patient Instructions on file for this visit.   Signed, Darreld Mclean, PA-C  10/16/2022 1:15 PM    [facility]

## 2022-10-21 NOTE — Congregational Nurse Program (Signed)
Brief encounter.  Doing ok, walking without assistance .  No brace on foot.  Sought me out as hadn't seen me in a while.  Vinnie Langton, [facility]  [phone]

## 2022-10-23 ENCOUNTER — Observation Stay: Payer: Medicare HMO | Admitting: Student

## 2022-11-06 NOTE — Congregational Nurse Program (Signed)
Reports from another member that she has had covid.  Niece is staying with her.  Vinnie Langton, [facility]  [phone]

## 2022-11-17 NOTE — Progress Notes (Unsigned)
Cardiology Clinic Note   Patient Name: Krista Rosales Date of Encounter: 11/26/2022  Primary Care Provider:  Josetta Huddle, MD Primary Cardiologist:  Kirk Ruths, MD  Patient Profile    Krista Rosales 88 year old female presents the clinic today for follow-up evaluation of her hypertension and chronic diastolic CHF.  Past Medical History    Past Medical History:  Diagnosis Date   ANEMIA, IRON DEFICIENCY 05/07/2009   Qualifier: Diagnosis of  By: Loanne Drilling MD, Sean A    ARTHRITIS 05/30/2008   Qualifier: Diagnosis of  By: Smith NCMA, [place], TYPE II 07/16/2007   Qualifier: Diagnosis of  By: Marca Ancona RMA, Lucy     Diastolic congestive heart failure ([place]) 09/2017   DIVERTICULOSIS, COLON 05/30/2008   Qualifier: Diagnosis of  By: Marland Mcalpine     Dyspnea    Glaucoma    HYPERLIPIDEMIA 07/16/2007   Qualifier: Diagnosis of  By: Marca Ancona RMA, Lucy     Hypertension    Normocytic anemia    Past Surgical History:  Procedure Laterality Date   ABDOMINAL AORTOGRAM W/LOWER EXTREMITY N/A 10/23/2021   Procedure: ABDOMINAL AORTOGRAM W/LOWER EXTREMITY;  Surgeon: Broadus John, MD;  Location: [facility];  Service: Cardiovascular;  Laterality: N/A;   ABDOMINAL HYSTERECTOMY      Allergies  Allergies  Allergen Reactions   Wellbutrin [Bupropion]     unknown   Codeine     hallucinate   Penicillins Rash    Did it involve swelling of the face/tongue/throat, SOB, or low BP? Y Did it involve sudden or severe rash/hives, skin peeling, or any reaction on the inside of your mouth or nose? Y Did you need to seek medical attention at a hospital or doctor's office? Y When did it last happen?  While at hospital    If all above answers are "NO", may proceed with cephalosporin use.     History of Present Illness    Krista Rosales has a PMH of HTN, CAD, chronic diastolic CHF, type 2 diabetes, HLD, hypokalemia, dyspnea, bradycardia, and glaucoma.  Her nuclear  stress test 1/19 showed an EF of 46% and no ischemia.  Her echocardiogram 1/20 showed normal LV function, moderate left ventricular hypertrophy, mild diastolic dysfunction, mild mitral regurgitation.  She wore a cardiac event monitor 8/20 which showed sinus bradycardia with first-degree AV block, NSR, sinus tach, intermittent Mobitz first-degree AV block, multiple pauses with the longest being 3.4 seconds, occasional PACs and PVCs.  Her clonidine patch was weaned off.  She was seen in follow-up by Dr. Stanford Breed on 09/13/2021.  During that time she was noted dyspnea with increased/vigorous activity.  She denied orthopnea PND, lower extremity swelling, chest pain, and syncopal episodes.  She presents to the clinic today for follow-up evaluation states she feels well.  She had a right foot injury several months ago which caused her to be nonweightbearing and wear a lower extremity boot.  She will follow-up with orthopedics.  She is now starting to increase her physical activity.  She enjoys walking in her neighborhood.  She does note some shortness of breath with increased physical activity.  She denies shortness of breath with normal activities.  Her blood pressure initially today is 172/52.  She reports she has whitecoat syndrome.  On recheck it is 122/58.  We will continue her current medication regimen, have her increase her physical activity as tolerated, give salty 6 diet sheet, and plan follow-up in 12  months.  Today she denies chest pain, shortness of breath, lower extremity edema, fatigue, palpitations, melena, hematuria, hemoptysis, diaphoresis, weakness, presyncope, syncope, orthopnea, and PND.    Home Medications    Prior to Admission medications   Medication Sig Start Date End Date Taking? Authorizing Provider  allopurinol (ZYLOPRIM) 100 MG tablet TAKE 1/2 TABLET EVERY DAY 10/13/22   Gregor Hams, MD  ALPRAZolam Duanne Moron) 0.5 MG tablet Take 0.25 mg by mouth 2 (two) times daily. 09/07/17    [provider]  amLODipine (NORVASC) 10 MG tablet Take 1 tablet (10 mg total) by mouth daily. 03/15/21   Lelon Perla, MD  aspirin EC 81 MG tablet Take 81 mg by mouth daily. Swallow whole.    [provider]  Blood Glucose Monitoring Suppl (TRUE METRIX METER) w/Device KIT  05/24/21   [provider]  cholecalciferol (VITAMIN D) 1000 UNITS tablet Take 2,000 Units by mouth daily.     [provider]  colchicine 0.6 MG tablet Take 0.5 tablets (0.3 mg total) by mouth daily as needed (gout or psuedogout pain). 02/10/22   Gregor Hams, MD  diclofenac Sodium (VOLTAREN) 1 % GEL Apply 4 g topically 4 (four) times daily. To affected joint. 06/27/20   Gregor Hams, MD  diphenhydrAMINE (BENADRYL) 25 MG tablet Take 1 tablet (25 mg total) by mouth every 8 (eight) hours as needed for itching. 03/11/22   Lamptey, Myrene Galas, MD  doxazosin (CARDURA) 2 MG tablet TAKE 1 TABLET(2 MG) BY MOUTH DAILY 03/15/21   Lelon Perla, MD  hydrALAZINE (APRESOLINE) 100 MG tablet Take 1 tablet (100 mg total) by mouth 3 (three) times daily. 03/15/21   Lelon Perla, MD  Lancets [facility] DELICA PLUS [id number]) MISC Apply 1 each topically 3 (three) times daily. 07/18/20   [provider]  magnesium hydroxide (MILK OF MAGNESIA) 400 MG/5ML suspension Take 15 mLs by mouth daily as needed for mild constipation.    [provider]  mupirocin ointment (BACTROBAN) 2 % Apply topically 2 (two) times daily. 09/09/21   [provider]  [facility] VERIO test strip 1 each 3 (three) times daily. 07/17/20   [provider]  SANTYL ointment Apply topically daily. 09/17/21   [provider]  terbinafine (LAMISIL) 250 MG tablet Take 1 tablet (250 mg total) by mouth daily. 03/11/22   Lamptey, Myrene Galas, MD  furosemide (LASIX) 20 MG tablet One tablet once daily as needed for swelling Patient taking differently: Take 20 mg by mouth See admin instructions. Takes 1 tablet  Tuesday,Thursday, Saturday and Sunday and 2 tablets on Monday, Wednesday and Friday. 11/15/18 03/11/21  Lelon Perla, MD  loratadine (CLARITIN) 5 MG/5ML syrup Take 5 mLs (5 mg total) by mouth daily. 04/18/19 03/11/21  Wieters, Hallie C, PA-C  omeprazole (PRILOSEC) 40 MG capsule Take 40 mg by mouth daily. 09/12/20 03/11/21  [provider]    Family History    Family History  Problem Relation Age of Onset   Diabetes Mellitus II Other    Pancreatic cancer Mother    Heart disease Father    CAD Brother    She indicated that her mother is deceased. She indicated that her father is deceased. She indicated that her brother is deceased. She indicated that her maternal grandmother is deceased. She indicated that her maternal grandfather is deceased. She indicated that her paternal grandmother is deceased. She indicated that her paternal grandfather is deceased. She indicated that the status of her other  is unknown.  Social History    Social History   Socioeconomic History   Marital status: Widowed    Spouse name: Not on file   Number of children: Not on file   Years of education: Not on file   Highest education level: Not on file  Occupational History   Not on file  Tobacco Use   Smoking status: Former   Smokeless tobacco: Never  Vaping Use   Vaping Use: Never used  Substance and Sexual Activity   Alcohol use: No   Drug use: No   Sexual activity: Never  Other Topics Concern   Not on file  Social History Narrative   Widowed.  No children.  Has a brother.     Social Determinants of Health   Financial Resource Strain: Not on file  Food Insecurity: Not on file  Transportation Needs: Not on file  Physical Activity: Not on file  Stress: Not on file  Social Connections: Not on file  Intimate Partner Violence: Not on file     Review of Systems    General:  No chills, fever, night sweats or weight changes.  Cardiovascular:  No chest pain, dyspnea on exertion, edema,  orthopnea, palpitations, paroxysmal nocturnal dyspnea. Dermatological: No rash, lesions/masses Respiratory: No cough, dyspnea Urologic: No hematuria, dysuria Abdominal:   No nausea, vomiting, diarrhea, bright red blood per rectum, melena, or hematemesis Neurologic:  No visual changes, wkns, changes in mental status. All other systems reviewed and are otherwise negative except as noted above.  Physical Exam    VS:  BP (!) 122/58   Pulse (!) 55   Ht 5' 8" (1.727 m)   Wt 146 lb 3.2 oz (66.3 kg)   SpO2 94%   BMI 22.23 kg/m  , BMI Body mass index is 22.23 kg/m. GEN: Well nourished, well developed, in no acute distress. HEENT: normal. Neck: Supple, no JVD, carotid bruits, or masses. Cardiac: RRR, no murmurs, rubs, or gallops. No clubbing, cyanosis, edema.  Radials/DP/PT 2+ and equal bilaterally.  Respiratory:  Respirations regular and unlabored, clear to auscultation bilaterally. GI: Soft, nontender, nondistended, BS + x 4. MS: no deformity or atrophy. Skin: warm and dry, no rash. Neuro:  Strength and sensation are intact. Psych: Normal affect.  Accessory Clinical Findings    Recent Labs: No results found for requested labs within last 365 days.   Recent Lipid Panel    Component Value Date/Time   CHOL 153 11/20/2017 1506   TRIG 66 11/20/2017 1506   HDL 40 (L) 11/20/2017 1506   CHOLHDL 3.8 11/20/2017 1506   VLDL 13 11/20/2017 1506   LDLCALC 100 (H) 11/20/2017 1506         ECG personally reviewed by me today-sinus rhythm with second-degree AV block left anterior fascicular block anterior infarct undetermined age 65 bpm- No acute changes  Echocardiogram 12/25/2018  Study Conclusions   - Left ventricle: The cavity size was normal. Wall thickness was    increased in a pattern of moderate LVH. Systolic function was    normal. The estimated ejection fraction was in the range of 55%    to 60%. Wall motion was normal; there were no regional wall    motion abnormalities.  Doppler parameters are consistent with    abnormal left ventricular relaxation (grade 1 diastolic    dysfunction).  - Mitral valve: Calcified annulus. There was mild regurgitation.   Impressions:   - Normal LV systolic function; moderate LVH; mild diastolic    dysfunction;  mild MR.   -------------------------------------------------------------------  Study data:  Comparison was made to the study of 10/01/2017.  Study  status:  Routine.  Procedure:  The patient reported no pain pre or  post test. Transthoracic echocardiography for left ventricular  function evaluation. Image quality was adequate.  Study completion:   There were no complications.          Transthoracic  echocardiography.  M-mode, complete 2D, spectral Doppler, and color  Doppler.  Birthdate:  Patient birthdate: [birth year].  Age:  Patient  is 88 yr old.  Sex:  Gender: female.    BMI: 24.5 kg/m^2.  Blood  pressure:     138/62  Patient status:  Outpatient.  Study date:  Study date: 01/04/2019. Study time: 10:19 AM.  Location:  [facility]   -------------------------------------------------------------------   -------------------------------------------------------------------  Left ventricle:  The cavity size was normal. Wall thickness was  increased in a pattern of moderate LVH. Systolic function was  normal. The estimated ejection fraction was in the range of 55% to  60%. Wall motion was normal; there were no regional wall motion  abnormalities. Doppler parameters are consistent with abnormal left  ventricular relaxation (grade 1 diastolic dysfunction).   -------------------------------------------------------------------  Aortic valve:   Trileaflet; normal thickness, mildly calcified  leaflets. Mobility was not restricted.  Doppler:  Transvalvular  velocity was within the normal range. There was no stenosis. There  was no regurgitation.   -------------------------------------------------------------------   Aorta: Aortic root: The aortic root was normal in size.   -------------------------------------------------------------------  Mitral valve:   Calcified annulus. Mobility was not restricted.  Doppler:  Transvalvular velocity was within the normal range. There  was no evidence for stenosis. There was mild regurgitation.  Valve area by pressure half-time: 4 cm^2. Indexed valve area by  pressure half-time: 2.13 cm^2/m^2.    Peak gradient (D): 3 mm Hg.    -------------------------------------------------------------------  Left atrium:  The atrium was normal in size.   -------------------------------------------------------------------  Right ventricle:  The cavity size was normal. Systolic function was  normal.   -------------------------------------------------------------------  Pulmonic valve:    Doppler:  Transvalvular velocity was within the  normal range. There was no evidence for stenosis.   -------------------------------------------------------------------  Tricuspid valve:   Structurally normal valve.    Doppler:  Transvalvular velocity was within the normal range. There was  trivial regurgitation.   -------------------------------------------------------------------  Right atrium:  The atrium was normal in size.   -------------------------------------------------------------------  Pericardium: There was no pericardial effusion.   -------------------------------------------------------------------  Systemic veins:  Inferior vena cava: The vessel was normal in size.   Assessment & Plan   1.  Essential hypertension-BP today 122/58.  Well-controlled at home. Continue amlodipine, doxazosin, hydralazine Heart healthy low-sodium diet-salty 6 given Increase physical activity as tolerated  Chronic diastolic CHF-weight stable.  Euvolemic.  No increased DOE or activity intolerance.  Echocardiogram 1/20 showed normal LV function moderate LVH, mild diastolic dysfunction, and  mild mitral regurgitation. Continue hydralazine Heart healthy low-sodium diet-salty 6 given Increase physical activity as tolerated  Bradycardia-EKG today shows sinus rhythm and second-degree AV block left anterior fascicular block anterior infarct undetermined age 41 bpm.  Denies episodes of lightheadedness, presyncope or syncope.  Previous cardiac event monitor showed sinus bradycardia with first-degree AV block, normal sinus rhythm, intermittent Mobitz type I second-degree AV block and multiple pauses with the longest being 3.4 seconds.  PACs and PVCs were also noted.  Avoid AV nodal blocking agents Continue to monitor  Disposition: Follow-up  with Dr. Stanford Breed or me in 12 months.   Jossie Ng. Cleaver NP-C     11/26/2022, 8:06 AM [place] [address] Office [phone] Fax [phone]    I spent 14 minutes examining this patient, reviewing medications, and using patient centered shared decision making involving her cardiac care.  Prior to her visit I spent greater than 20 minutes reviewing her past medical history,  medications, and prior cardiac tests.

## 2022-11-26 ENCOUNTER — Ambulatory Visit: Payer: Medicare HMO | Attending: General Practice | Admitting: General Practice

## 2022-11-26 ENCOUNTER — Encounter: Payer: Self-pay | Admitting: General Practice

## 2022-11-26 VITALS — BP 122/58 | HR 55 | Ht 68.0 in | Wt 146.2 lb

## 2022-11-26 DIAGNOSIS — R001 Bradycardia, unspecified: Secondary | ICD-10-CM

## 2022-11-26 DIAGNOSIS — I10 Essential (primary) hypertension: Secondary | ICD-10-CM | POA: Diagnosis not present

## 2022-11-26 DIAGNOSIS — I5032 Chronic diastolic (congestive) heart failure: Secondary | ICD-10-CM

## 2022-11-26 NOTE — Patient Instructions (Signed)
Medication Instructions:  The current medical regimen is effective;  continue present plan and medications as directed. Please refer to the Current Medication list given to you today.  *If you need a refill on your cardiac medications before your next appointment, please call your pharmacy*  Lab Work: NONE If you have labs (blood work) drawn today and your tests are completely normal, you will receive your results only by: [place] (if you have MyChart) OR A paper copy in the mail If you have any lab test that is abnormal or we need to change your treatment, we will call you to review the results.  Testing/Procedures: NONE  Follow-Up: At [facility], you and your health needs are our priority.  As part of our continuing mission to provide you with exceptional heart care, we have created designated Provider Care Teams.  These Care Teams include your primary Cardiologist (physician) and Advanced Practice Providers (APPs -  Physician Assistants and Nurse Practitioners) who all work together to provide you with the care you need, when you need it.  We recommend signing up for the patient portal called "MyChart".  Sign up information is provided on this After Visit Summary.  MyChart is used to connect with patients for Virtual Visits (Telemedicine).  Patients are able to view lab/test results, encounter notes, upcoming appointments, etc.  Non-urgent messages can be sent to your provider as well.   To learn more about what you can do with MyChart, go to [email or web address].    Your next appointment:   12 month(s)  The format for your next appointment:   In Person  Provider:   Kirk Ruths, MD    Other Instructions TAKE AND LOG YOUR BLOOD PRESSURE  PLEASE READ AND FOLLOW ATTACHED  SALTY 6  MAINTAIN PHYSICAL ACTIVITY  Important Information About Sugar

## 2023-01-19 ENCOUNTER — Ambulatory Visit (INDEPENDENT_AMBULATORY_CARE_PROVIDER_SITE_OTHER): Payer: Medicare PPO | Admitting: Family Medicine

## 2023-01-19 ENCOUNTER — Ambulatory Visit: Payer: Self-pay

## 2023-01-19 VITALS — BP 196/58 | HR 68 | Ht 68.0 in | Wt 145.0 lb

## 2023-01-19 DIAGNOSIS — M25531 Pain in right wrist: Secondary | ICD-10-CM

## 2023-01-19 DIAGNOSIS — M1A3620 Chronic gout due to renal impairment, left knee, without tophus (tophi): Secondary | ICD-10-CM

## 2023-01-19 LAB — BASIC METABOLIC PANEL WITH GFR
BUN: 42 mg/dL — ABNORMAL HIGH (ref 6–23)
CO2: 18 meq/L — ABNORMAL LOW (ref 19–32)
Calcium: 8.7 mg/dL (ref 8.4–10.5)
Chloride: 109 meq/L (ref 96–112)
Creatinine, Ser: 1.84 mg/dL — ABNORMAL HIGH (ref 0.40–1.20)
GFR: 24.54 mL/min — ABNORMAL LOW
Glucose, Bld: 125 mg/dL — ABNORMAL HIGH (ref 70–99)
Potassium: 4.4 meq/L (ref 3.5–5.1)
Sodium: 137 meq/L (ref 135–145)

## 2023-01-19 LAB — URIC ACID: Uric Acid, Serum: 9.1 mg/dL — ABNORMAL HIGH (ref 2.4–7.0)

## 2023-01-19 NOTE — Progress Notes (Signed)
I, [name], LAT, ATC acting as a scribe for [name], MD.  Krista Rosales is a 89 y.o. female who presents to [place] at [facility] today for R wrist pain. Pt was last seen by Dr. Georgina Snell on 02/25/22 for R wrist/hand pain thought to be due to gout and DJD and allopurinol was re-sent to her pharmacy. Today, pt reports R wrist pain ongoing since Saturday after using a can opener. Pt locates pain to around the ulnar styloid and into the volar aspect of her hand.    Her primary care provider stopped allopurinol at some point the last year.  She is not sure why.  Primary care provider was formally Dr. Inda Merlin at [facility] however he has since retired.  Pertinent review of systems: No fevers or chills  Relevant historical information: Heart failure.  Diabetes.  CKD 4.   Exam:  BP (!) 196/58   Pulse 68   Ht '5\' 8"'$  (1.727 m)   Wt 145 lb (65.8 kg)   SpO2 94%   BMI 22.05 kg/m  General: Well Developed, well nourished, and in no acute distress.   MSK: Right wrist moderate joint effusion.  Decreased range of motion.  Tender palpation dorsal wrist.  Intact strength.    Lab and Radiology Results  Procedure: Real-time Ultrasound Guided Injection of right dorsal wrist joint Device: Philips Affiniti 50G Images permanently stored and available for review in PACS Verbal informed consent obtained.  Discussed risks and benefits of procedure. Warned about infection, bleeding, hyperglycemia damage to structures among others. Patient expresses understanding and agreement Time-out conducted.   Noted no overlying erythema, induration, or other signs of local infection.   Skin prepped in a sterile fashion.   Local anesthesia: Topical Ethyl chloride.   With sterile technique and under real time ultrasound guidance: 40 mg of Kenalog and 1 mL of lidocaine injected into wrist joint. Fluid seen entering the joint capsule.   Completed without difficulty   Pain immediately resolved  suggesting accurate placement of the medication.   Advised to call if fevers/chills, erythema, induration, drainage, or persistent bleeding.   Images permanently stored and available for review in the ultrasound unit.  Impression: Technically successful ultrasound guided injection.        Assessment and Plan: 89 y.o. female with right wrist pain and swelling.  This is either exacerbation of DJD or gout flare very likely.  Plan for steroid injection.  Will check uric acid and metabolic panel as it has been over a year since he has been checked that I can see.  She no longer is on uric acid lowering medication.  I suspect allopurinol was stopped probably because of worsening renal function but I am not sure why exactly.   PDMP not reviewed this encounter. Orders Placed This Encounter  Procedures   [place] LIMITED JOINT SPACE STRUCTURES UP RIGHT(NO LINKED CHARGES)    Order Specific Question:   Reason for Exam (SYMPTOM  OR DIAGNOSIS REQUIRED)    Answer:   right wrist pain    Order Specific Question:   Preferred imaging location?    Answer:   [place]   Uric acid    Standing Status:   Future    Number of Occurrences:   1    Standing Expiration Date:   1/32/4401   Basic Metabolic Panel (BMET)   No orders of the defined types were placed in this encounter.    Discussed warning signs or symptoms.  Please see discharge instructions. Patient expresses understanding.   The above documentation has been reviewed and is accurate and complete [name], M.D.

## 2023-01-19 NOTE — Patient Instructions (Signed)
Thank you for coming in today.   Please get labs today before you leave   Call or go to the ER if you develop a large red swollen joint with extreme pain or oozing puss.     

## 2023-01-20 NOTE — Progress Notes (Signed)
Kidney function is about the same.  And gout is not good.  When are you seeing your primary care provider next?  You should see them soon.

## 2023-01-27 DIAGNOSIS — E8770 Fluid overload, unspecified: Secondary | ICD-10-CM | POA: Diagnosis not present

## 2023-01-27 DIAGNOSIS — I130 Hypertensive heart and chronic kidney disease with heart failure and stage 1 through stage 4 chronic kidney disease, or unspecified chronic kidney disease: Secondary | ICD-10-CM | POA: Diagnosis not present

## 2023-01-27 DIAGNOSIS — E1165 Type 2 diabetes mellitus with hyperglycemia: Secondary | ICD-10-CM | POA: Diagnosis not present

## 2023-01-27 DIAGNOSIS — E1122 Type 2 diabetes mellitus with diabetic chronic kidney disease: Secondary | ICD-10-CM | POA: Diagnosis not present

## 2023-01-27 DIAGNOSIS — M109 Gout, unspecified: Secondary | ICD-10-CM | POA: Diagnosis not present

## 2023-01-27 DIAGNOSIS — I10 Essential (primary) hypertension: Secondary | ICD-10-CM | POA: Diagnosis not present

## 2023-01-27 DIAGNOSIS — R5383 Other fatigue: Secondary | ICD-10-CM | POA: Diagnosis not present

## 2023-02-03 DIAGNOSIS — M71571 Other bursitis, not elsewhere classified, right ankle and foot: Secondary | ICD-10-CM | POA: Diagnosis not present

## 2023-03-03 DIAGNOSIS — E1122 Type 2 diabetes mellitus with diabetic chronic kidney disease: Secondary | ICD-10-CM | POA: Diagnosis not present

## 2023-03-03 DIAGNOSIS — J449 Chronic obstructive pulmonary disease, unspecified: Secondary | ICD-10-CM | POA: Diagnosis not present

## 2023-03-03 DIAGNOSIS — I110 Hypertensive heart disease with heart failure: Secondary | ICD-10-CM | POA: Diagnosis not present

## 2023-03-03 DIAGNOSIS — E113299 Type 2 diabetes mellitus with mild nonproliferative diabetic retinopathy without macular edema, unspecified eye: Secondary | ICD-10-CM | POA: Diagnosis not present

## 2023-03-03 DIAGNOSIS — I700 Atherosclerosis of aorta: Secondary | ICD-10-CM | POA: Diagnosis not present

## 2023-03-03 DIAGNOSIS — I5030 Unspecified diastolic (congestive) heart failure: Secondary | ICD-10-CM | POA: Diagnosis not present

## 2023-03-03 DIAGNOSIS — D509 Iron deficiency anemia, unspecified: Secondary | ICD-10-CM | POA: Diagnosis not present

## 2023-03-03 DIAGNOSIS — N184 Chronic kidney disease, stage 4 (severe): Secondary | ICD-10-CM | POA: Diagnosis not present

## 2023-03-25 DIAGNOSIS — M71571 Other bursitis, not elsewhere classified, right ankle and foot: Secondary | ICD-10-CM | POA: Diagnosis not present

## 2023-03-26 DIAGNOSIS — E113593 Type 2 diabetes mellitus with proliferative diabetic retinopathy without macular edema, bilateral: Secondary | ICD-10-CM | POA: Diagnosis not present

## 2023-03-26 DIAGNOSIS — H35362 Drusen (degenerative) of macula, left eye: Secondary | ICD-10-CM | POA: Diagnosis not present

## 2023-03-26 DIAGNOSIS — H43822 Vitreomacular adhesion, left eye: Secondary | ICD-10-CM | POA: Diagnosis not present

## 2023-03-26 DIAGNOSIS — H35373 Puckering of macula, bilateral: Secondary | ICD-10-CM | POA: Diagnosis not present

## 2023-03-26 DIAGNOSIS — H35033 Hypertensive retinopathy, bilateral: Secondary | ICD-10-CM | POA: Diagnosis not present

## 2023-04-03 DIAGNOSIS — E1151 Type 2 diabetes mellitus with diabetic peripheral angiopathy without gangrene: Secondary | ICD-10-CM | POA: Diagnosis not present

## 2023-04-03 DIAGNOSIS — L603 Nail dystrophy: Secondary | ICD-10-CM | POA: Diagnosis not present

## 2023-04-03 DIAGNOSIS — L84 Corns and callosities: Secondary | ICD-10-CM | POA: Diagnosis not present

## 2023-04-03 DIAGNOSIS — I739 Peripheral vascular disease, unspecified: Secondary | ICD-10-CM | POA: Diagnosis not present

## 2023-04-17 ENCOUNTER — Other Ambulatory Visit: Payer: Self-pay | Admitting: Nephrology

## 2023-04-17 DIAGNOSIS — E872 Acidosis, unspecified: Secondary | ICD-10-CM

## 2023-04-17 DIAGNOSIS — I129 Hypertensive chronic kidney disease with stage 1 through stage 4 chronic kidney disease, or unspecified chronic kidney disease: Secondary | ICD-10-CM

## 2023-04-17 DIAGNOSIS — N184 Chronic kidney disease, stage 4 (severe): Secondary | ICD-10-CM

## 2023-04-17 DIAGNOSIS — N189 Chronic kidney disease, unspecified: Secondary | ICD-10-CM | POA: Diagnosis not present

## 2023-04-17 DIAGNOSIS — D631 Anemia in chronic kidney disease: Secondary | ICD-10-CM

## 2023-04-17 DIAGNOSIS — E1122 Type 2 diabetes mellitus with diabetic chronic kidney disease: Secondary | ICD-10-CM

## 2023-05-20 ENCOUNTER — Ambulatory Visit (INDEPENDENT_AMBULATORY_CARE_PROVIDER_SITE_OTHER): Payer: Medicare PPO | Admitting: Family Medicine

## 2023-05-20 ENCOUNTER — Other Ambulatory Visit: Payer: Self-pay

## 2023-05-20 VITALS — BP 128/68 | HR 72 | Ht 68.0 in | Wt 147.0 lb

## 2023-05-20 DIAGNOSIS — M25531 Pain in right wrist: Secondary | ICD-10-CM | POA: Diagnosis not present

## 2023-05-20 DIAGNOSIS — M1A3310 Chronic gout due to renal impairment, right wrist, without tophus (tophi): Secondary | ICD-10-CM

## 2023-05-20 LAB — BASIC METABOLIC PANEL
BUN: 42 mg/dL — ABNORMAL HIGH (ref 6–23)
CO2: 23 mEq/L (ref 19–32)
Calcium: 8.8 mg/dL (ref 8.4–10.5)
Chloride: 107 mEq/L (ref 96–112)
Creatinine, Ser: 2 mg/dL — ABNORMAL HIGH (ref 0.40–1.20)
GFR: 22.15 mL/min — ABNORMAL LOW (ref >60.00)
Glucose, Bld: 116 mg/dL — ABNORMAL HIGH (ref 70–99)
Potassium: 3.9 mEq/L (ref 3.5–5.1)
Sodium: 139 mEq/L (ref 135–145)

## 2023-05-20 LAB — URIC ACID: Uric Acid, Serum: 7.1 mg/dL — ABNORMAL HIGH (ref 2.4–7.0)

## 2023-05-20 NOTE — Patient Instructions (Signed)
Thank you for coming in today.   You received an injection today. Seek immediate medical attention if the joint becomes red, extremely painful, or is oozing fluid.   Please get labs today before you leave  

## 2023-05-20 NOTE — Progress Notes (Signed)
Krista Payor, PhD, LAT, ATC acting as a scribe for Krista Graham, MD.  Krista Rosales is a 89 y.o. female who presents to [facility] today for cont'd R wrist and thumb pain. Pt was last seen by Dr. Denyse Amass on 01/19/23 and was given a R dorsal wrist joint steroid injection. Today, pt reports R wrist pain really worsened on Saturday. Pt locates pain to the dorsum of her R wrist, into hand and thumb. She notes limited motion w/ ulnar deviation.   She was last seen in January.  At that time uric acid was checked and was found to be very elevated.  She was started on 50 mg of allopurinol daily.  Dose was adjusted based on renal function.  She currently takes allopurinol 50 mg daily.  Dx testing: 01/19/23 Labs  11/04/21 Labs  09/26/21 Labs  08/14/21 R wrist XR  06/07/21 R hand XR  Pertinent review of systems: No fevers or chills  Relevant historical information: History of gout   Exam:  BP 128/68   Pulse 72   Ht 5\' 8"  (1.727 m)   Wt 147 lb (66.7 kg)   SpO2 95%   BMI 22.35 kg/m  General: Well Developed, well nourished, and in no acute distress.   MSK: Right wrist mild effusion otherwise normal. To palpation.  Decreased motion.    Lab and Radiology Results  Procedure: Real-time Ultrasound Guided Injection of right wrist radiocarpal joint dorsal aspect Device: Philips Affiniti 50G Images permanently stored and available for review in PACS Verbal informed consent obtained.  Discussed risks and benefits of procedure. Warned about infection, bleeding, hyperglycemia damage to structures among others. Patient expresses understanding and agreement Time-out conducted.   Noted no overlying erythema, induration, or other signs of local infection.   Skin prepped in a sterile fashion.   Local anesthesia: Topical Ethyl chloride.   With sterile technique and under real time ultrasound guidance: 40 mg of Kenalog and 1 mL of Marcaine injected into wrist joint.  Fluid seen entering the joint capsule.   Completed without difficulty   Pain immediately resolved suggesting accurate placement of the medication.   Advised to call if fevers/chills, erythema, induration, drainage, or persistent bleeding.   Images permanently stored and available for review in the ultrasound unit.  Impression: Technically successful ultrasound guided injection.        Assessment and Plan: 89 y.o. female with right wrist pain thought to be exacerbation of DJD or gout.  Plan for steroid injection.  Recheck uric acid and metabolic panel and adjust allopurinol as result. Recheck back as needed.   PDMP not reviewed this encounter. Orders Placed This Encounter  Procedures   [place] LIMITED JOINT SPACE STRUCTURES UP RIGHT(NO LINKED CHARGES)    Order Specific Question:   Reason for Exam (SYMPTOM  OR DIAGNOSIS REQUIRED)    Answer:   right wrist pain    Order Specific Question:   Preferred imaging location?    Answer:   [facility] metabolic panel    Standing Status:   Future    Number of Occurrences:   1    Standing Expiration Date:   05/19/2024   Uric acid    Standing Status:   Future    Number of Occurrences:   1    Standing Expiration Date:   05/19/2024   No orders of the defined types were placed in this encounter.    Discussed warning signs  or symptoms. Please see discharge instructions. Patient expresses understanding.   The above documentation has been reviewed and is accurate and complete Krista Rosales, M.D.

## 2023-05-21 NOTE — Progress Notes (Signed)
Uric acid is improving.  Kidney function is stable to worsening.  Continue current dose of allopurinol.

## 2023-05-26 DIAGNOSIS — E119 Type 2 diabetes mellitus without complications: Secondary | ICD-10-CM | POA: Diagnosis not present

## 2023-06-04 NOTE — Progress Notes (Signed)
   Rubin Payor, PhD, LAT, ATC acting as a scribe for [name], MD.  Krista Rosales is a 89 y.o. female who presents to [facility] at [facility] today for exacerbation of her L knee pain. Her last visit for her L knee was on 02/25/22, completing the Orthovisc series, 3/3. Last L knee steroid injection was Jan 2023.   Today, pt reports that the injection she had in her knee a year ago has helped so much states that the pain did not start back up till last Friday. Patient states she is having a hard time walking and would like to get another injection to help her be able to walk without pain so she doesn't fall.   Dx imaging: 03/30/20 L knee XR  Pertinent review of systems: No fevers or chills  Relevant historical information: Diabetes and heart disease   Exam:  BP 110/60   Pulse 65   Ht 5\' 8"  (1.727 m)   Wt 143 lb (64.9 kg)   SpO2 92%   BMI 21.74 kg/m  General: Well Developed, well nourished, and in no acute distress.   MSK: Left knee moderate to large effusion otherwise normal-appearing Normal motion.  Intact strength.  Tender palpation medial joint line.    Lab and Radiology Results  Procedure: Real-time Ultrasound Guided Injection of left knee joint superior lateral patellar space Device: Philips Affiniti 50G Images permanently stored and available for review in PACS Verbal informed consent obtained.  Discussed risks and benefits of procedure. Warned about infection, bleeding, hyperglycemia damage to structures among others. Patient expresses understanding and agreement Time-out conducted.   Noted no overlying erythema, induration, or other signs of local infection.   Skin prepped in a sterile fashion.   Local anesthesia: Topical Ethyl chloride.   With sterile technique and under real time ultrasound guidance: 40 mg of Kenalog and 2 mL of Marcaine injected into knee joint. Fluid seen entering the joint capsule.   Completed without difficulty   Pain  immediately resolved suggesting accurate placement of the medication.   Advised to call if fevers/chills, erythema, induration, drainage, or persistent bleeding.   Images permanently stored and available for review in the ultrasound unit.  Impression: Technically successful ultrasound guided injection.         Assessment and Plan: 89 y.o. female with left knee pain due to exacerbation of DJD.  Plan for steroid injection today.  Recheck back as needed.   PDMP not reviewed this encounter. Orders Placed This Encounter  Procedures   [place] LIMITED JOINT SPACE STRUCTURES LOW LEFT(NO LINKED CHARGES)    Standing Status:   Future    Number of Occurrences:   1    Standing Expiration Date:   06/04/2024    Order Specific Question:   Reason for Exam (SYMPTOM  OR DIAGNOSIS REQUIRED)    Answer:   Left knee pain    Order Specific Question:   Preferred imaging location?    Answer:   [facility]   No orders of the defined types were placed in this encounter.    Discussed warning signs or symptoms. Please see discharge instructions. Patient expresses understanding.   The above documentation has been reviewed and is accurate and complete [name], M.D.

## 2023-06-05 ENCOUNTER — Ambulatory Visit (INDEPENDENT_AMBULATORY_CARE_PROVIDER_SITE_OTHER): Payer: Medicare PPO | Admitting: Family Medicine

## 2023-06-05 ENCOUNTER — Other Ambulatory Visit: Payer: Self-pay

## 2023-06-05 VITALS — BP 110/60 | HR 65 | Ht 68.0 in | Wt 143.0 lb

## 2023-06-05 DIAGNOSIS — G8929 Other chronic pain: Secondary | ICD-10-CM | POA: Diagnosis not present

## 2023-06-05 DIAGNOSIS — M25562 Pain in left knee: Secondary | ICD-10-CM | POA: Diagnosis not present

## 2023-06-05 NOTE — Patient Instructions (Signed)
Good to see you  ?Injection in left knee today ?Call or go to the ER if you develop a large red swollen joint with extreme pain or oozing puss.  ? ?Follow up as needed ?

## 2023-07-02 DIAGNOSIS — L853 Xerosis cutis: Secondary | ICD-10-CM | POA: Diagnosis not present

## 2023-07-02 DIAGNOSIS — L298 Other pruritus: Secondary | ICD-10-CM | POA: Diagnosis not present

## 2023-07-03 DIAGNOSIS — L603 Nail dystrophy: Secondary | ICD-10-CM | POA: Diagnosis not present

## 2023-07-03 DIAGNOSIS — L97522 Non-pressure chronic ulcer of other part of left foot with fat layer exposed: Secondary | ICD-10-CM | POA: Diagnosis not present

## 2023-07-03 DIAGNOSIS — L84 Corns and callosities: Secondary | ICD-10-CM | POA: Diagnosis not present

## 2023-07-03 DIAGNOSIS — I739 Peripheral vascular disease, unspecified: Secondary | ICD-10-CM | POA: Diagnosis not present

## 2023-07-03 DIAGNOSIS — E1151 Type 2 diabetes mellitus with diabetic peripheral angiopathy without gangrene: Secondary | ICD-10-CM | POA: Diagnosis not present

## 2023-07-17 DIAGNOSIS — L97522 Non-pressure chronic ulcer of other part of left foot with fat layer exposed: Secondary | ICD-10-CM | POA: Diagnosis not present

## 2023-07-27 DIAGNOSIS — M71572 Other bursitis, not elsewhere classified, left ankle and foot: Secondary | ICD-10-CM | POA: Diagnosis not present

## 2023-08-13 ENCOUNTER — Other Ambulatory Visit: Payer: Self-pay

## 2023-08-13 MED ORDER — ALLOPURINOL 100 MG PO TABS: 50.0000 mg | ORAL_TABLET | Freq: Every day | ORAL | 10 refills | Status: AC

## 2023-09-03 DIAGNOSIS — I129 Hypertensive chronic kidney disease with stage 1 through stage 4 chronic kidney disease, or unspecified chronic kidney disease: Secondary | ICD-10-CM | POA: Diagnosis not present

## 2023-09-03 DIAGNOSIS — E872 Acidosis, unspecified: Secondary | ICD-10-CM | POA: Diagnosis not present

## 2023-09-03 DIAGNOSIS — N184 Chronic kidney disease, stage 4 (severe): Secondary | ICD-10-CM | POA: Diagnosis not present

## 2023-09-03 DIAGNOSIS — N189 Chronic kidney disease, unspecified: Secondary | ICD-10-CM | POA: Diagnosis not present

## 2023-09-03 DIAGNOSIS — D631 Anemia in chronic kidney disease: Secondary | ICD-10-CM | POA: Diagnosis not present

## 2023-09-03 DIAGNOSIS — E1122 Type 2 diabetes mellitus with diabetic chronic kidney disease: Secondary | ICD-10-CM | POA: Diagnosis not present

## 2023-10-02 DIAGNOSIS — I739 Peripheral vascular disease, unspecified: Secondary | ICD-10-CM | POA: Diagnosis not present

## 2023-10-02 DIAGNOSIS — E1151 Type 2 diabetes mellitus with diabetic peripheral angiopathy without gangrene: Secondary | ICD-10-CM | POA: Diagnosis not present

## 2023-10-02 DIAGNOSIS — L84 Corns and callosities: Secondary | ICD-10-CM | POA: Diagnosis not present

## 2023-10-02 DIAGNOSIS — L603 Nail dystrophy: Secondary | ICD-10-CM | POA: Diagnosis not present

## 2023-10-12 DIAGNOSIS — H35033 Hypertensive retinopathy, bilateral: Secondary | ICD-10-CM | POA: Diagnosis not present

## 2023-10-12 DIAGNOSIS — E113593 Type 2 diabetes mellitus with proliferative diabetic retinopathy without macular edema, bilateral: Secondary | ICD-10-CM | POA: Diagnosis not present

## 2023-10-12 DIAGNOSIS — H0289 Other specified disorders of eyelid: Secondary | ICD-10-CM | POA: Diagnosis not present

## 2023-10-12 DIAGNOSIS — H43822 Vitreomacular adhesion, left eye: Secondary | ICD-10-CM | POA: Diagnosis not present

## 2023-10-12 DIAGNOSIS — H35362 Drusen (degenerative) of macula, left eye: Secondary | ICD-10-CM | POA: Diagnosis not present

## 2023-10-12 DIAGNOSIS — H35373 Puckering of macula, bilateral: Secondary | ICD-10-CM | POA: Diagnosis not present

## 2023-10-28 NOTE — Progress Notes (Unsigned)
   Rubin Payor, PhD, LAT, ATC acting as a scribe for [name], MD.  Krista Rosales is a 89 y.o. female who presents to [facility] at [facility] today for R thumb pain. Pt was previously seen by Dr. Denyse Amass on 06/05/23 for her L knee.  Today, pt c/o R thumb pain x ***. Pt locates pain to ***  Grip strength: Aggravates: Treatments tried:  Dx testing: 05/20/23 Labs 01/19/23 Labs             11/04/21 Labs             09/26/21 Labs             08/14/21 R wrist XR             06/07/21 R hand XR  Pertinent review of systems: ***  Relevant historical information: ***   Exam:  There were no vitals taken for this visit. General: Well Developed, well nourished, and in no acute distress.   MSK: ***    Lab and Radiology Results No results found for this or any previous visit (from the past 72 hour(s)). No results found.     Assessment and Plan: 89 y.o. female with ***   PDMP not reviewed this encounter. No orders of the defined types were placed in this encounter.  No orders of the defined types were placed in this encounter.    Discussed warning signs or symptoms. Please see discharge instructions. Patient expresses understanding.   ***

## 2023-10-29 ENCOUNTER — Other Ambulatory Visit: Payer: Self-pay

## 2023-10-29 ENCOUNTER — Ambulatory Visit (INDEPENDENT_AMBULATORY_CARE_PROVIDER_SITE_OTHER): Payer: Medicare PPO | Admitting: Family Medicine

## 2023-10-29 VITALS — BP 142/48 | HR 84 | Ht 68.0 in | Wt 146.0 lb

## 2023-10-29 DIAGNOSIS — M79644 Pain in right finger(s): Secondary | ICD-10-CM

## 2023-10-29 NOTE — Patient Instructions (Addendum)
Thank you for coming in today.   You received an injection today. Seek immediate medical attention if the joint becomes red, extremely painful, or is oozing fluid.   Let me know when your knees start hurting you again.  Check back as needed

## 2023-12-07 ENCOUNTER — Ambulatory Visit: Payer: Medicare PPO | Attending: Adult Health | Admitting: Adult Health

## 2023-12-07 ENCOUNTER — Encounter: Payer: Self-pay | Admitting: Adult Health

## 2023-12-07 VITALS — BP 168/54 | HR 57 | Ht 67.0 in | Wt 154.2 lb

## 2023-12-07 DIAGNOSIS — R001 Bradycardia, unspecified: Secondary | ICD-10-CM

## 2023-12-07 DIAGNOSIS — I340 Nonrheumatic mitral (valve) insufficiency: Secondary | ICD-10-CM | POA: Diagnosis not present

## 2023-12-07 DIAGNOSIS — I10 Essential (primary) hypertension: Secondary | ICD-10-CM

## 2023-12-07 LAB — BASIC METABOLIC PANEL
BUN/Creatinine Ratio: 19 (ref 12–28)
BUN: 30 mg/dL — ABNORMAL HIGH (ref 8–27)
CO2: 18 mmol/L — ABNORMAL LOW (ref 20–29)
Calcium: 8.4 mg/dL — ABNORMAL LOW (ref 8.7–10.3)
Chloride: 108 mmol/L — ABNORMAL HIGH (ref 96–106)
Creatinine, Ser: 1.57 mg/dL — ABNORMAL HIGH (ref 0.57–1.00)
Glucose: 141 mg/dL — ABNORMAL HIGH (ref 70–99)
Potassium: 3.9 mmol/L (ref 3.5–5.2)
Sodium: 141 mmol/L (ref 134–144)
eGFR: 32 mL/min/{1.73_m2} — ABNORMAL LOW (ref >59)

## 2023-12-07 NOTE — Progress Notes (Signed)
Cardiology Office Note:  .   Date:  12/07/2023  ID:  Krista Rosales, DOB 1934/10/08, MRN [id number] PCP: Marden Noble, MD (Inactive)  [facility] Providers Cardiologist:  Olga Millers, MD {  History of Present Illness: .   Krista Rosales is a 89 y.o. female  HTN, CAD, chronic diastolic CHF, type 2 diabetes, HLD, hypokalemia, dyspnea, bradycardia, and glaucoma. Her nuclear stress test 1/19 showed an EF of 46% and no ischemia. Her echocardiogram 1/20 showed normal LV function, moderate left ventricular hypertrophy, mild diastolic dysfunction, mild mitral regurgitation.   She wore a cardiac event monitor 8/20 which showed sinus bradycardia with first-degree AV block, NSR, sinus tach, intermittent Mobitz first-degree AV block, multiple pauses with the longest being 3.4 seconds, occasional PACs and PVCs. Her clonidine patch was weaned off.  She was last seen by Edd Fabian on 11/27/2023 She denies chest pain, palpitations, dyspnea, pnd, orthopnea, n, v, dizziness, syncope, edema, weight gain, or early satiety. All other systems reviewed and are otherwise negative except as noted above.  She was stable from cardiac standpoint.  No AV nodal blocking agents due to bradycardia. he w  She comes today with complaints of shortness of breath with exertion she took a church trip yesterday and she had to stop a couple times to catch her breath.  She denies any chest pain palpitations or dizziness.  She does have some lower extremity edema on occasion when she is been seated for a while.   ROS: As above otherwise negative  Studies Reviewed: Marland Kitchen   EKG Interpretation Date/Time:  Monday December 07 2023 07:54:39 EST Ventricular Rate:  57 PR Interval:    QRS Duration:  98 QT Interval:  498 QTC Calculation: 484 R Axis:   -62  Text Interpretation: Sinus rhythm with 2nd degree A-V block (Mobitz I) Incomplete right bundle branch block Left anterior fascicular block Possible Anterolateral infarct  (cited on or before 11-Mar-2022) When compared with ECG of 11-Mar-2022 15:24, Sinus rhythm is now with 2nd degree A-V block (Mobitz I) Questionable change in initial forces of Lateral leads Nonspecific T wave abnormality now evident in Anterolateral leads QT has lengthened Confirmed by Joni Reining [id number]) on 12/07/2023 8:07:31 AM   Echocardiogram 01/04/2019 Left ventricle: The cavity size was normal. Wall thickness was    increased in a pattern of moderate LVH. Systolic function was    normal. The estimated ejection fraction was in the range of 55%    to 60%. Wall motion was normal; there were no regional wall    motion abnormalities. Doppler parameters are consistent with    abnormal left ventricular relaxation (grade 1 diastolic    dysfunction).  - Mitral valve: Calcified annulus. There was mild regurgitation.     EKG Interpretation Date/Time:  Monday December 07 2023 07:54:39 EST Ventricular Rate:  57 PR Interval:    QRS Duration:  98 QT Interval:  498 QTC Calculation: 484 R Axis:   -62  Text Interpretation: Sinus rhythm with 2nd degree A-V block (Mobitz I) Incomplete right bundle branch block Left anterior fascicular block Possible Anterolateral infarct (cited on or before 11-Mar-2022) When compared with ECG of 11-Mar-2022 15:24, Sinus rhythm is now with 2nd degree A-V block (Mobitz I) Questionable change in initial forces of Lateral leads Nonspecific T wave abnormality now evident in Anterolateral leads QT has lengthened Confirmed by Joni Reining [id number]) on 12/07/2023 8:07:31 AM    Physical Exam:   VS:  BP (!) 168/54 (BP Location: Right Arm,  Patient Position: Sitting, Cuff Size: Small)   Pulse (!) 57   Ht 5\' 7"  (1.702 m)   Wt 154 lb 3.2 oz (69.9 kg)   SpO2 95%   BMI 24.15 kg/m    Wt Readings from Last 3 Encounters:  12/07/23 154 lb 3.2 oz (69.9 kg)  10/29/23 146 lb (66.2 kg)  06/05/23 143 lb (64.9 kg)    GEN: Well nourished, well developed in no acute  distress NECK: No JVD; No carotid bruits CARDIAC: RRR, harsh 2/6 systolic murmur, heard best at the left sternal border, soft murmur right sternal border with right carotid bruit noted none on the left.  RESPIRATORY:  Clear to auscultation without rales, wheezing or rhonchi  ABDOMEN: Soft, non-tender, non-distended EXTREMITIES:   mild dependent edema; No deformity   ASSESSMENT AND PLAN: .   Hypertension: Blood pressure is elevated today.  She does have whitecoat syndrome.  She has just taken her medication prior to coming to the office today.  She will have a repeat echocardiogram to evaluate for LV function and I will check a BMET today.  2.  Mitral valve murmur: Noted to have calcification on echocardiogram 4 years ago.  Rechecking to evaluate mitral valve and aortic valve due to harsh murmur at the mitral valve location.  This may be adding to her shortness of breath.  Will review and make recommendations based upon her results with her advanced age not certain that she would be a candidate for mitral valve repair however she remains active normally so she may benefit.  Will wait for results.  3.  Sinus bradycardia: She denies any dizziness.  EKG reveals first-degree AV block heart rate in the 50s.  No AV nodal blocking agents.  Continue to follow.         Signed, Bettey Mare. Liborio Nixon, ANP, AACC

## 2023-12-07 NOTE — Patient Instructions (Signed)
Medication Instructions:  No Changes *If you need a refill on your cardiac medications before your next appointment, please call your pharmacy*   Lab Work: BMET If you have labs (blood work) drawn today and your tests are completely normal, you will receive your results only by: MyChart Message (if you have MyChart) OR A paper copy in the mail If you have any lab test that is abnormal or we need to change your treatment, we will call you to review the results.   Testing/Procedures: [address], [address] Your physician has requested that you have an echocardiogram. Echocardiography is a painless test that uses sound waves to create images of your heart. It provides your doctor with information about the size and shape of your heart and how well your heart's chambers and valves are working. This procedure takes approximately one hour. There are no restrictions for this procedure. Please do NOT wear cologne, perfume, aftershave, or lotions (deodorant is allowed). Please arrive 15 minutes prior to your appointment time.  Please note: We ask at that you not bring children with you during ultrasound (echo/ vascular) testing. Due to room size and safety concerns, children are not allowed in the ultrasound rooms during exams. Our front office staff cannot provide observation of children in our lobby area while testing is being conducted. An adult accompanying a patient to their appointment will only be allowed in the ultrasound room at the discretion of the ultrasound technician under special circumstances. We apologize for any inconvenience.    Follow-Up: At [facility], you and your health needs are our priority.  As part of our continuing mission to provide you with exceptional heart care, we have created designated Provider Care Teams.  These Care Teams include your primary Cardiologist (physician) and Advanced Practice Providers (APPs -  Physician Assistants and Nurse  Practitioners) who all work together to provide you with the care you need, when you need it.  We recommend signing up for the patient portal called "MyChart".  Sign up information is provided on this After Visit Summary.  MyChart is used to connect with patients for Virtual Visits (Telemedicine).  Patients are able to view lab/test results, encounter notes, upcoming appointments, etc.  Non-urgent messages can be sent to your provider as well.   To learn more about what you can do with MyChart, go to [email or web address].    Your next appointment:   6 month(s)  Provider:   Olga Millers, MD

## 2023-12-08 ENCOUNTER — Telehealth: Payer: Self-pay

## 2023-12-08 NOTE — Telephone Encounter (Addendum)
Called patient regarding results. Left message for patient to call office.----- Message from Joni Reining sent at 12/08/2023  7:46 AM EST ----- I have reviewed labs.  Kidney function is improved.  Kidney filtering is lessend due to age, otherwise no worrisome values. KL

## 2023-12-09 ENCOUNTER — Telehealth: Payer: Self-pay

## 2023-12-09 NOTE — Telephone Encounter (Addendum)
Patient returned call regarding results. Patient had understanding of results.----- Message from Joni Reining sent at 12/08/2023  7:46 AM EST ----- I have reviewed labs.  Kidney function is improved.  Kidney filtering is lessend due to age, otherwise no worrisome values. KL

## 2023-12-10 ENCOUNTER — Ambulatory Visit: Payer: Medicare PPO | Admitting: Family Medicine

## 2023-12-10 ENCOUNTER — Other Ambulatory Visit: Payer: Self-pay

## 2023-12-10 VITALS — BP 176/74 | HR 84 | Ht 67.0 in | Wt 153.0 lb

## 2023-12-10 DIAGNOSIS — G8929 Other chronic pain: Secondary | ICD-10-CM

## 2023-12-10 DIAGNOSIS — M25511 Pain in right shoulder: Secondary | ICD-10-CM | POA: Diagnosis not present

## 2023-12-10 DIAGNOSIS — I10 Essential (primary) hypertension: Secondary | ICD-10-CM

## 2023-12-10 DIAGNOSIS — E118 Type 2 diabetes mellitus with unspecified complications: Secondary | ICD-10-CM

## 2023-12-10 NOTE — Progress Notes (Signed)
Rubin Payor, PhD, LAT, ATC acting as a scribe for [name], MD.  Krista Rosales is a 89 y.o. female who presents to [facility] at [facility] today for R arm pain. Pt was previously seen by Dr. Denyse Amass on 10/29/23 and was given a R 1st MCP steroid injection.  Today, pt c/o R shoulder pain ongoing since last week, worsening this wk. Pt locates pain to the anterior aspect of her R shoulder w/ radiating pain through upper arm to elbow. RHD. She notes very limited AROM due to pain.   Dx testing: 05/20/23 Labs 01/19/23 Labs             11/04/21 Labs             09/26/21 Labs             Pertinent review of systems: No fevers or chills  Relevant historical information: Hypertension diabetes   Exam:  BP (!) 176/74   Pulse 84   Ht 5\' 7"  (1.702 m)   Wt 153 lb (69.4 kg)   SpO2 91%   BMI 23.96 kg/m  General: Well Developed, well nourished, and in no acute distress.   MSK: Right shoulder normal-appearing Limited motion with pain.  Strength intact within limits of motion.    Lab and Radiology Results  Procedure: Real-time Ultrasound Guided Injection of right shoulder subdeltoid bursa Device: Philips Affiniti 50G/GE Logiq Images permanently stored and available for review in PACS Verbal informed consent obtained.  Discussed risks and benefits of procedure. Warned about infection, bleeding, hyperglycemia damage to structures among others. Patient expresses understanding and agreement Time-out conducted.   Noted no overlying erythema, induration, or other signs of local infection.   Skin prepped in a sterile fashion.   Local anesthesia: Topical Ethyl chloride.   With sterile technique and under real time ultrasound guidance: 40 mg of Kenalog and 2 mL's of Marcaine injected into subdeltoid bursa. Fluid seen entering the bursa.   Completed without difficulty   Pain moderately resolved suggesting accurate placement of the medication.   Advised to call if  fevers/chills, erythema, induration, drainage, or persistent bleeding.   Images permanently stored and available for review in the ultrasound unit.  Impression: Technically successful ultrasound guided injection.       Assessment and Plan: 89 y.o. female with chronic right shoulder pain with acute exacerbation.  Area of pain is more anterior and she does have a little bit of subdeltoid bursitis visible on ultrasound.  Plan for injection of the subdeltoid bursa.  That is not effective would recommend glenohumeral joint injection in the near future. Avoid high-dose oral NSAIDs given hypertension and diabetes.  PDMP not reviewed this encounter. Orders Placed This Encounter  Procedures   [place] LIMITED JOINT SPACE STRUCTURES UP RIGHT(NO LINKED CHARGES)    Reason for Exam (SYMPTOM  OR DIAGNOSIS REQUIRED):   right shoulder pain    Preferred imaging location?:   [facility] Shoulder Right    Standing Status:   Future    Expiration Date:   12/09/2024    Reason for Exam (SYMPTOM  OR DIAGNOSIS REQUIRED):   right shoulder pain    Preferred imaging location?:   [place]   No orders of the defined types were placed in this encounter.    Discussed warning signs or symptoms. Please see discharge instructions. Patient expresses understanding.   The above documentation has been reviewed and is accurate and complete Clayburn Pert  Denyse Amass, M.D.

## 2023-12-10 NOTE — Patient Instructions (Addendum)
Thank you for coming in today.   You received an injection today. Seek immediate medical attention if the joint becomes red, extremely painful, or is oozing fluid.   Check back as needed 

## 2023-12-18 ENCOUNTER — Other Ambulatory Visit: Payer: Self-pay

## 2023-12-18 ENCOUNTER — Encounter (HOSPITAL_BASED_OUTPATIENT_CLINIC_OR_DEPARTMENT_OTHER): Payer: Self-pay | Admitting: Emergency Medicine

## 2023-12-18 ENCOUNTER — Emergency Department (HOSPITAL_BASED_OUTPATIENT_CLINIC_OR_DEPARTMENT_OTHER): Payer: Medicare PPO

## 2023-12-18 ENCOUNTER — Emergency Department (HOSPITAL_BASED_OUTPATIENT_CLINIC_OR_DEPARTMENT_OTHER)
Admission: EM | Admit: 2023-12-18 | Discharge: 2023-12-18 | Disposition: A | Discharge status: Home / Self Care | Payer: Medicare PPO | Attending: Emergency Medicine | Admitting: Emergency Medicine

## 2023-12-18 DIAGNOSIS — Z7982 Long term (current) use of aspirin: Secondary | ICD-10-CM | POA: Diagnosis not present

## 2023-12-18 DIAGNOSIS — Z20822 Contact with and (suspected) exposure to covid-19: Secondary | ICD-10-CM | POA: Insufficient documentation

## 2023-12-18 DIAGNOSIS — R059 Cough, unspecified: Secondary | ICD-10-CM | POA: Diagnosis present

## 2023-12-18 DIAGNOSIS — B974 Respiratory syncytial virus as the cause of diseases classified elsewhere: Secondary | ICD-10-CM | POA: Insufficient documentation

## 2023-12-18 DIAGNOSIS — J210 Acute bronchiolitis due to respiratory syncytial virus: Secondary | ICD-10-CM | POA: Diagnosis not present

## 2023-12-18 DIAGNOSIS — R079 Chest pain, unspecified: Secondary | ICD-10-CM | POA: Diagnosis not present

## 2023-12-18 DIAGNOSIS — R0602 Shortness of breath: Secondary | ICD-10-CM | POA: Diagnosis not present

## 2023-12-18 DIAGNOSIS — R058 Other specified cough: Secondary | ICD-10-CM | POA: Diagnosis not present

## 2023-12-18 LAB — RESP PANEL BY RT-PCR (RSV, FLU A&B, COVID)  RVPGX2
Influenza A by PCR: NEGATIVE
Influenza B by PCR: NEGATIVE
Resp Syncytial Virus by PCR: POSITIVE — AB
SARS Coronavirus 2 by RT PCR: NEGATIVE

## 2023-12-18 MED ORDER — ONDANSETRON 4 MG PO TBDP: ORAL_TABLET | ORAL | 0 refills | Status: DC

## 2023-12-18 MED ORDER — BENZONATATE 100 MG PO CAPS: 100.0000 mg | ORAL_CAPSULE | Freq: Three times a day (TID) | ORAL | 0 refills | Status: DC

## 2023-12-18 NOTE — ED Triage Notes (Signed)
C/o productive cough and sneezing x 1 week.  Denies any fevers or CP.

## 2023-12-18 NOTE — Discharge Instructions (Signed)
Take tylenol 2 pills 4 times a day and motrin 4 pills 3 times a day.  Drink plenty of fluids.  Return for worsening shortness of breath, headache, confusion. Follow up with your family doctor.   

## 2023-12-18 NOTE — ED Provider Notes (Signed)
[place] EMERGENCY DEPARTMENT AT [facility] Provider Note   CSN: [id number] Arrival date & time: 12/18/23  1109     History  Chief Complaint  Patient presents with   Cough    Krista Rosales is a 89 y.o. female.  89 yo F with a chief complaint of cough congestion going on for few days now.  Noted sick contacts.  Planing mostly of just copious mucus.  This made her gag and vomit at times.  Sometimes has difficulty breathing with cough.  Denies nausea or vomiting otherwise.  Denies abdominal pain.  Has some left-sided chest pain with coughing.   Cough      Home Medications Prior to Admission medications   Medication Sig Start Date End Date Taking? Authorizing Provider  benzonatate (TESSALON) 100 MG capsule Take 1 capsule (100 mg total) by mouth every 8 (eight) hours. 12/18/23  Yes Melene Plan, DO  ondansetron (ZOFRAN-ODT) 4 MG disintegrating tablet 4mg  ODT q4 hours prn nausea/vomit 12/18/23  Yes Melene Plan, DO  allopurinol (ZYLOPRIM) 100 MG tablet Take 0.5 tablets (50 mg total) by mouth daily. 08/13/23   Rodolph Bong, MD  amLODipine (NORVASC) 10 MG tablet Take 1 tablet (10 mg total) by mouth daily. 03/15/21   Lewayne Bunting, MD  aspirin EC 81 MG tablet Take 81 mg by mouth daily. Swallow whole.    [provider]  Blood Glucose Monitoring Suppl (TRUE METRIX METER) w/Device KIT  05/24/21   [provider]  cholecalciferol (VITAMIN D) 1000 UNITS tablet Take 2,000 Units by mouth daily.     [provider]  diphenhydrAMINE (BENADRYL) 25 MG tablet Take 1 tablet (25 mg total) by mouth every 8 (eight) hours as needed for itching. 03/11/22   Lamptey, Britta Mccreedy, MD  doxazosin (CARDURA) 2 MG tablet TAKE 1 TABLET(2 MG) BY MOUTH DAILY 03/15/21   Lewayne Bunting, MD  hydrALAZINE (APRESOLINE) 100 MG tablet Take 1 tablet (100 mg total) by mouth 3 (three) times daily. 03/15/21   Lewayne Bunting, MD  Lancets [facility] DELICA PLUS [place]) MISC Apply 1 each  topically 3 (three) times daily. 07/18/20   [provider]  magnesium hydroxide (MILK OF MAGNESIA) 400 MG/5ML suspension Take 15 mLs by mouth daily as needed for mild constipation.    [provider]  mupirocin ointment (BACTROBAN) 2 % Apply topically 2 (two) times daily. 09/09/21   [provider]  [facility] VERIO test strip 1 each 3 (three) times daily. 07/17/20   [provider]  SANTYL ointment Apply topically daily. 09/17/21   [provider]  furosemide (LASIX) 20 MG tablet One tablet once daily as needed for swelling Patient taking differently: Take 20 mg by mouth See admin instructions. Takes 1 tablet Tuesday,Thursday, Saturday and Sunday and 2 tablets on Monday, Wednesday and Friday. 11/15/18 03/11/21  Lewayne Bunting, MD  loratadine (CLARITIN) 5 MG/5ML syrup Take 5 mLs (5 mg total) by mouth daily. 04/18/19 03/11/21  Wieters, Hallie C, PA-C  omeprazole (PRILOSEC) 40 MG capsule Take 40 mg by mouth daily. 09/12/20 03/11/21  [provider]      Allergies    Wellbutrin [bupropion], Codeine, and Penicillins    Review of Systems   Review of Systems  Respiratory:  Positive for cough.     Physical Exam Updated Vital Signs BP (!) 157/80 (BP Location: Left Arm)   Pulse 81   Temp 98.7 F (37.1 C)   Resp 18   Ht 5\' 7"  (  1.702 m)   Wt 68 kg   SpO2 94%   BMI 23.49 kg/m  Physical Exam Vitals and nursing note reviewed.  Constitutional:      General: She is not in acute distress.    Appearance: She is well-developed. She is not diaphoretic.  HENT:     Head: Normocephalic and atraumatic.     Comments: Swollen turbinates, posterior nasal drip, no noted sinus ttp, tm normal bilaterally.   Eyes:     Pupils: Pupils are equal, round, and reactive to light.  Cardiovascular:     Rate and Rhythm: Normal rate and regular rhythm.     Heart sounds: No murmur heard.    No friction rub. No gallop.  Pulmonary:     Effort: Pulmonary effort is  normal.     Breath sounds: No wheezing or rales.  Abdominal:     General: There is no distension.     Palpations: Abdomen is soft.     Tenderness: There is no abdominal tenderness.  Musculoskeletal:        General: No tenderness.     Cervical back: Normal range of motion and neck supple.  Skin:    General: Skin is warm and dry.  Neurological:     Mental Status: She is alert and oriented to person, place, and time.  Psychiatric:        Behavior: Behavior normal.     ED Results / Procedures / Treatments   Labs (all labs ordered are listed, but only abnormal results are displayed) Labs Reviewed  RESP PANEL BY RT-PCR (RSV, FLU A&B, COVID)  RVPGX2 - Abnormal; Notable for the following components:      Result Value   Resp Syncytial Virus by PCR POSITIVE (*)    All other components within normal limits    EKG None  Radiology DG Chest Port 1 View Result Date: 12/18/2023 CLINICAL DATA:  Cough. Shortness of breath. Productive cough and sneezing for 1 week. EXAM: PORTABLE CHEST 1 VIEW COMPARISON:  Chest radiographs 08/06/2021 and 04/17/2021 FINDINGS: Cardiac silhouette is again mildly enlarged. Moderate calcification within the aortic arch. Unchanged mild chronic bilateral interstitial thickening. Unchanged mild bilateral basilar linear subsegmental atelectasis versus scarring. No pleural effusion or pneumothorax. Moderate multilevel bridging osteophytes of the thoracic spine. IMPRESSION: Unchanged mild chronic bilateral interstitial thickening. No acute lung process. Electronically Signed   By: Neita Garnet M.D.   On: 12/18/2023 13:44    Procedures Procedures    Medications Ordered in ED Medications - No data to display  ED Course/ Medical Decision Making/ A&P                                 Medical Decision Making Amount and/or Complexity of Data Reviewed Radiology: ordered.  Risk Prescription drug management.   89 yo F with a chief complaint of cough congestion going  on for about 4 days now.  She is well-appearing and nontoxic.  By history I do suspect she has RSV.  She did have COVID flu and RSV testing upfront and she was positive for RSV.  She does have some adventitious lung sounds and complaining of left posterior chest pain will obtain chest x-ray.  CXR independently interpreted by me without focal infiltrate.  D/c home.  PCP follow up.   3:15 PM:  I have discussed the diagnosis/risks/treatment options with the patient.  Evaluation and diagnostic testing in the emergency department does not  suggest an emergent condition requiring admission or immediate intervention beyond what has been performed at this time.  They will follow up with PCP. We also discussed returning to the ED immediately if new or worsening sx occur. We discussed the sx which are most concerning (e.g., sudden worsening pain, fever, inability to tolerate by mouth) that necessitate immediate return. Medications administered to the patient during their visit and any new prescriptions provided to the patient are listed below.  Medications given during this visit Medications - No data to display   The patient appears reasonably screen and/or stabilized for discharge and I doubt any other medical condition or other [facility] requiring further screening, evaluation, or treatment in the ED at this time prior to discharge.          Final Clinical Impression(s) / ED Diagnoses Final diagnoses:  RSV (acute bronchiolitis due to respiratory syncytial virus)    Rx / DC Orders ED Discharge Orders          Ordered    benzonatate (TESSALON) 100 MG capsule  Every 8 hours        12/18/23 1415    ondansetron (ZOFRAN-ODT) 4 MG disintegrating tablet        12/18/23 1415              Melene Plan, DO 12/18/23 1515

## 2024-01-05 ENCOUNTER — Telehealth: Payer: Self-pay | Admitting: Cardiology

## 2024-01-05 NOTE — Telephone Encounter (Signed)
 Pt is requesting a callback regarding her wanting a prescription for excessive coughing. Please advise

## 2024-01-05 NOTE — Telephone Encounter (Signed)
 Called and spoke to patient. Verified name and DOB. Patient is calling to get an prescription for cough medication. Patient was advised to reach out to her PCP. She stated she does not have a PCP until the end of the month. She was in the ED 12/27 and was diagnosed with RSV. She was prescribed Tessalon  Pearls which she stated is no working. Patient would like to know what can she take OTC for the cough. Please advise.

## 2024-01-06 ENCOUNTER — Ambulatory Visit (HOSPITAL_COMMUNITY)
Admission: RE | Admit: 2024-01-06 | Discharge: 2024-01-06 | Disposition: A | Discharge status: Home / Self Care | Payer: Medicare PPO | Source: Ambulatory Visit | Attending: Adult Health | Admitting: Adult Health

## 2024-01-06 DIAGNOSIS — I10 Essential (primary) hypertension: Secondary | ICD-10-CM

## 2024-01-06 NOTE — Telephone Encounter (Signed)
 LVM, detailed, per DPR.  Pharmacist advice given and also reminded pt to reach out to her PCP about Cough medicines.

## 2024-01-11 DIAGNOSIS — L603 Nail dystrophy: Secondary | ICD-10-CM | POA: Diagnosis not present

## 2024-01-11 DIAGNOSIS — I739 Peripheral vascular disease, unspecified: Secondary | ICD-10-CM | POA: Diagnosis not present

## 2024-01-11 DIAGNOSIS — E1151 Type 2 diabetes mellitus with diabetic peripheral angiopathy without gangrene: Secondary | ICD-10-CM | POA: Diagnosis not present

## 2024-01-11 DIAGNOSIS — L84 Corns and callosities: Secondary | ICD-10-CM | POA: Diagnosis not present

## 2024-01-25 ENCOUNTER — Other Ambulatory Visit: Payer: Self-pay | Admitting: Nephrology

## 2024-01-25 DIAGNOSIS — N184 Chronic kidney disease, stage 4 (severe): Secondary | ICD-10-CM

## 2024-02-01 DIAGNOSIS — E113591 Type 2 diabetes mellitus with proliferative diabetic retinopathy without macular edema, right eye: Secondary | ICD-10-CM | POA: Diagnosis not present

## 2024-02-01 DIAGNOSIS — H35033 Hypertensive retinopathy, bilateral: Secondary | ICD-10-CM | POA: Diagnosis not present

## 2024-02-01 DIAGNOSIS — H35373 Puckering of macula, bilateral: Secondary | ICD-10-CM | POA: Diagnosis not present

## 2024-02-01 DIAGNOSIS — H43822 Vitreomacular adhesion, left eye: Secondary | ICD-10-CM | POA: Diagnosis not present

## 2024-02-01 DIAGNOSIS — H35363 Drusen (degenerative) of macula, bilateral: Secondary | ICD-10-CM | POA: Diagnosis not present

## 2024-02-01 DIAGNOSIS — E113512 Type 2 diabetes mellitus with proliferative diabetic retinopathy with macular edema, left eye: Secondary | ICD-10-CM | POA: Diagnosis not present

## 2024-02-10 ENCOUNTER — Ambulatory Visit (HOSPITAL_COMMUNITY)
Admission: RE | Admit: 2024-02-10 | Payer: Medicare PPO | Source: Ambulatory Visit | Attending: Adult Health | Admitting: Adult Health

## 2024-02-19 DIAGNOSIS — E119 Type 2 diabetes mellitus without complications: Secondary | ICD-10-CM | POA: Diagnosis not present

## 2024-02-19 DIAGNOSIS — I10 Essential (primary) hypertension: Secondary | ICD-10-CM | POA: Diagnosis not present

## 2024-02-19 DIAGNOSIS — Z0000 Encounter for general adult medical examination without abnormal findings: Secondary | ICD-10-CM | POA: Diagnosis not present

## 2024-02-19 DIAGNOSIS — M10041 Idiopathic gout, right hand: Secondary | ICD-10-CM | POA: Diagnosis not present

## 2024-03-10 ENCOUNTER — Ambulatory Visit (HOSPITAL_COMMUNITY)
Admission: RE | Admit: 2024-03-10 | Discharge: 2024-03-10 | Disposition: A | Discharge status: Home / Self Care | Payer: Medicare PPO | Source: Ambulatory Visit | Attending: Cardiovascular Disease | Admitting: Cardiovascular Disease

## 2024-03-10 DIAGNOSIS — I10 Essential (primary) hypertension: Secondary | ICD-10-CM | POA: Insufficient documentation

## 2024-03-10 DIAGNOSIS — R0609 Other forms of dyspnea: Secondary | ICD-10-CM | POA: Diagnosis not present

## 2024-03-10 LAB — ECHOCARDIOGRAM COMPLETE
AR max vel: 1.54 cm2
AV Area VTI: 1.34 cm2
AV Area mean vel: 1.27 cm2
AV Mean grad: 9.5 mmHg
AV Peak grad: 15.4 mmHg
Ao pk vel: 1.97 m/s
Area-P 1/2: 5.93 cm2
S' Lateral: 3.72 cm

## 2024-03-14 DIAGNOSIS — N184 Chronic kidney disease, stage 4 (severe): Secondary | ICD-10-CM | POA: Diagnosis not present

## 2024-03-21 DIAGNOSIS — I10 Essential (primary) hypertension: Secondary | ICD-10-CM | POA: Diagnosis not present

## 2024-03-21 DIAGNOSIS — M10041 Idiopathic gout, right hand: Secondary | ICD-10-CM | POA: Diagnosis not present

## 2024-03-21 DIAGNOSIS — N183 Chronic kidney disease, stage 3 unspecified: Secondary | ICD-10-CM | POA: Diagnosis not present

## 2024-03-21 DIAGNOSIS — E119 Type 2 diabetes mellitus without complications: Secondary | ICD-10-CM | POA: Diagnosis not present

## 2024-03-22 DIAGNOSIS — N184 Chronic kidney disease, stage 4 (severe): Secondary | ICD-10-CM | POA: Diagnosis not present

## 2024-03-22 DIAGNOSIS — D631 Anemia in chronic kidney disease: Secondary | ICD-10-CM | POA: Diagnosis not present

## 2024-03-22 DIAGNOSIS — N189 Chronic kidney disease, unspecified: Secondary | ICD-10-CM | POA: Diagnosis not present

## 2024-03-22 DIAGNOSIS — E872 Acidosis, unspecified: Secondary | ICD-10-CM | POA: Diagnosis not present

## 2024-03-22 DIAGNOSIS — I129 Hypertensive chronic kidney disease with stage 1 through stage 4 chronic kidney disease, or unspecified chronic kidney disease: Secondary | ICD-10-CM | POA: Diagnosis not present

## 2024-03-22 DIAGNOSIS — E1122 Type 2 diabetes mellitus with diabetic chronic kidney disease: Secondary | ICD-10-CM | POA: Diagnosis not present

## 2024-04-27 ENCOUNTER — Ambulatory Visit: Admitting: Podiatry

## 2024-05-02 DIAGNOSIS — E119 Type 2 diabetes mellitus without complications: Secondary | ICD-10-CM | POA: Diagnosis not present

## 2024-05-02 DIAGNOSIS — N183 Chronic kidney disease, stage 3 unspecified: Secondary | ICD-10-CM | POA: Diagnosis not present

## 2024-05-02 DIAGNOSIS — M10041 Idiopathic gout, right hand: Secondary | ICD-10-CM | POA: Diagnosis not present

## 2024-05-02 DIAGNOSIS — I10 Essential (primary) hypertension: Secondary | ICD-10-CM | POA: Diagnosis not present

## 2024-05-02 DIAGNOSIS — D631 Anemia in chronic kidney disease: Secondary | ICD-10-CM | POA: Diagnosis not present

## 2024-05-10 NOTE — Progress Notes (Signed)
 I, Miquel Amen, CMA acting as a scribe for Garlan Juniper, MD.  Krista Rosales is a 89 y.o. female who presents to [facility] at [facility] today for R arm pain. Pt was last seen by Dr. Alease Hunter on 12/10/23 and was given a R subdeltoid bursa injection.  Today, pt reports exacerbation of right shoulder sx over the past week. Reports limited ROM with overhead reaching. C/O radiating pain from the shoulder to the elbow. Denies n/t/w.   Additionally she notes a small prominent hard skin lesion on the palmar DIP area of her second digit right hand.  She also notes a small mildly tender prominent area on the dorsal aspect of her fifth DIP.  Pertinent review of systems: No fevers or chills  Relevant historical information: Hypertension and coronary artery disease.  Heart failure.  Diabetes.   Exam:  BP 138/62   Pulse 60   Ht 5\' 7"  (1.702 m)   Wt 139 lb (63 kg)   SpO2 96%   BMI 21.77 kg/m  General: Well Developed, well nourished, and in no acute distress.   MSK: Right shoulder normal-appearing decreased motion decree strength abduction.  Right hand: Small keratin circular lesion that is firm and nontender palmar second DIP.  Consistent with a small wart. Small skin colored fleshy nodule dorsal fifth DIP.  Mildly tender to palpation.    Lab and Radiology Results  Procedure: Real-time Ultrasound Guided Injection of right shoulder subdeltoid bursa Device: Philips Affiniti 50G/GE Logiq Images permanently stored and available for review in PACS Verbal informed consent obtained.  Discussed risks and benefits of procedure. Warned about infection, bleeding, hyperglycemia damage to structures among others. Patient expresses understanding and agreement Time-out conducted.   Noted no overlying erythema, induration, or other signs of local infection.   Skin prepped in a sterile fashion.   Local anesthesia: Topical Ethyl chloride.   With sterile technique and under real  time ultrasound guidance: 40 mg of Kenalog  and 2 ml of Marcaine injected into subdeltoid bursa. Fluid seen entering the bursa.   Completed without difficulty   Pain immediately resolved suggesting accurate placement of the medication.   Advised to call if fevers/chills, erythema, induration, drainage, or persistent bleeding.   Images permanently stored and available for review in the ultrasound unit.  Impression: Technically successful ultrasound guided injection.    Diagnostic Limited MSK Ultrasound of: Hand fifth digit dorsal DIP nodule No cystic hypoechoic fluid noted.  Nodule is consistent with surrounding subcutaneous tissue.  There is some osteophyte development at the dorsal aspect of the DIP deep to the nodule. Impression: Soft tissue prominence no evidence of ganglion cyst.     Assessment and Plan: 89 y.o. female with right shoulder pain due to DJD and chronic rotator cuff tear.  Plan for repeat injection.  Previous injection was December 2024 has worked quite well.  Patient additionally has a small wart on the palmar aspect of her second DIP.  Plan for watchful waiting and consider over-the-counter wart remover.  Additionally she has a small nodule on the dorsal aspect of her fifth DIP.  Etiology is unclear.  This could be overlying skin prominence due to osteophyte development.  It is not a ganglion cyst.  Watchful waiting for now consider steroid injection.  Distally recommended a button hook to assist with dressing. PDMP not reviewed this encounter. Orders Placed This Encounter  Procedures   US  LIMITED JOINT SPACE STRUCTURES UP RIGHT(NO LINKED CHARGES)    Reason for Exam (  SYMPTOM  OR DIAGNOSIS REQUIRED):   right arm pain    Preferred imaging location?:   [facility]   No orders of the defined types were placed in this encounter.    Discussed warning signs or symptoms. Please see discharge instructions. Patient expresses understanding.   The  above documentation has been reviewed and is accurate and complete Garlan Juniper, M.D.

## 2024-05-11 ENCOUNTER — Encounter: Payer: Self-pay | Admitting: Family Medicine

## 2024-05-11 ENCOUNTER — Other Ambulatory Visit: Payer: Self-pay

## 2024-05-11 ENCOUNTER — Ambulatory Visit (INDEPENDENT_AMBULATORY_CARE_PROVIDER_SITE_OTHER): Admitting: Family Medicine

## 2024-05-11 VITALS — BP 138/62 | HR 60 | Ht 67.0 in | Wt 139.0 lb

## 2024-05-11 DIAGNOSIS — G8929 Other chronic pain: Secondary | ICD-10-CM | POA: Diagnosis not present

## 2024-05-11 DIAGNOSIS — R2231 Localized swelling, mass and lump, right upper limb: Secondary | ICD-10-CM | POA: Diagnosis not present

## 2024-05-11 DIAGNOSIS — M25511 Pain in right shoulder: Secondary | ICD-10-CM

## 2024-05-11 NOTE — Patient Instructions (Signed)
 Thank you for coming in today.   You received an injection today. Seek immediate medical attention if the joint becomes red, extremely painful, or is oozing fluid.   Try using a Vallerie Gave  See you back as needed.

## 2024-05-25 ENCOUNTER — Encounter: Payer: Self-pay | Admitting: Podiatry

## 2024-05-25 ENCOUNTER — Ambulatory Visit (INDEPENDENT_AMBULATORY_CARE_PROVIDER_SITE_OTHER): Admitting: Podiatry

## 2024-05-25 DIAGNOSIS — M79672 Pain in left foot: Secondary | ICD-10-CM | POA: Diagnosis not present

## 2024-05-25 DIAGNOSIS — B351 Tinea unguium: Secondary | ICD-10-CM | POA: Diagnosis not present

## 2024-05-25 DIAGNOSIS — I70209 Unspecified atherosclerosis of native arteries of extremities, unspecified extremity: Secondary | ICD-10-CM

## 2024-05-25 DIAGNOSIS — E1151 Type 2 diabetes mellitus with diabetic peripheral angiopathy without gangrene: Secondary | ICD-10-CM

## 2024-05-25 DIAGNOSIS — L97511 Non-pressure chronic ulcer of other part of right foot limited to breakdown of skin: Secondary | ICD-10-CM | POA: Diagnosis not present

## 2024-05-25 DIAGNOSIS — M79671 Pain in right foot: Secondary | ICD-10-CM

## 2024-05-25 NOTE — Progress Notes (Signed)
 Patient presents with complaint of painful nail on the third toe right.  Is been bothering her wearing shoes quite a bit.  Other nails except for where she has had nail surgeries on the great toes also been somewhat sore on walking and wearing shoes.  Says she has noticed a little bit of clear drainage from the third toenail.  Physical Exam:  Patient alert and oriented x 3.  No complaints of nausea, vomiting, fever, or chills  Vascular: DP pulses diminished bilaterally. PT pulses nonpalpable bilaterally.  Mild edema. Capillary fill time immediate.  Dermatologic: Superficial just penetrating into the dermis ulcer distal third toe right.. Measures 6 mm wide x 7 mm long x 1 deep.  Clear drainage.  No erythema.  Mild exudate.  No undermining.  No signs of infection.  Onychomycosis with thickening discoloration of the nail and hypertrophy of the nail edges with redness toes 1 through 5 on the left and toes 2 4 and 5 on the right  Neurologic: Does not complain of burning or paresthesias  Musculoskeletal: Hammertoe third toe right  Radiographs: None  Diagnoses: Superficial just penetrating the dermis Wagner grade 1 ulcer third toe right. Painful onychomycotic nails 2 through 5 the left and 2 4 and 5 right 3.  Diabetes mellitus type 2 with PVD  Plan: -Sharply debrided any devitalized tissue to a good bleeding base on the third toe right.  Debrided any devitalized tissue.  Applied triple antibiotic and a light dressing. -Debrided painful mycotic nails 2 through 5 the left and 2, 4, and 5 right -Wound care ulcer: Soak foot in warm Epsom salt water twice daily for 15 minutes.  Apply triple antibiotic ointment and a light dressing.  Do this for 2 weeks and area should ulcer should be healed.  If not healed she should call also if he notices any signs of infection she should call  Return month [facility]

## 2024-06-15 NOTE — Progress Notes (Signed)
 HPI: FU bradycardia and hypertension. Nuclear study January 2019 showed ejection fraction 46% and no ischemia or infarction. Ejection fraction felt better than calculated number. Monitor August 2020 showed sinus bradycardia with first-degree AV block, normal sinus rhythm, sinus tachycardia, intermittent Mobitz 1 second-degree AV block, multiple pauses with longest being 3.4 seconds, occasional PAC and PVC. Clonidine  patch weaned to off.  ABIs October 2022 showed abnormal toe-brachial index bilaterally and otherwise noncompressible.  Arteriogram November 2022 showed diffuse microvascular disease at the level of the ankle and foot not amenable to endovascular intervention.  Echocardiogram March 2025 showed normal LV function, moderate left ventricular enlargement, mild left ventricular hypertrophy, grade 2 diastolic dysfunction, moderate right ventricular dysfunction, mild right ventricular enlargement, severe pulmonary hypertension, severe left atrial enlargement, moderate right atrial enlargement, mild to moderate mitral digitation, trace aortic insufficiency.  Since last seen she denies chest pain, palpitations, pedal edema or syncope.  She does have some dyspnea on exertion.  Current Outpatient Medications  Medication Sig Dispense Refill   allopurinol  (ZYLOPRIM ) 100 MG tablet Take 0.5 tablets (50 mg total) by mouth daily. 45 tablet 10   amLODipine  (NORVASC ) 10 MG tablet Take 1 tablet (10 mg total) by mouth daily. 90 tablet 3   aspirin  EC 81 MG tablet Take 81 mg by mouth daily. Swallow whole.     benzonatate  (TESSALON ) 100 MG capsule Take 1 capsule (100 mg total) by mouth every 8 (eight) hours. 21 capsule 0   Blood Glucose Monitoring Suppl (TRUE METRIX METER) w/Device KIT      cholecalciferol  (VITAMIN D ) 1000 UNITS tablet Take 2,000 Units by mouth daily.      diphenhydrAMINE  (BENADRYL ) 25 MG tablet Take 1 tablet (25 mg total) by mouth every 8 (eight) hours as needed for itching. 30 tablet 0    doxazosin  (CARDURA ) 2 MG tablet TAKE 1 TABLET(2 MG) BY MOUTH DAILY 90 tablet 3   furosemide  (LASIX ) 20 MG tablet Take 20 mg by mouth daily.     hydrALAZINE  (APRESOLINE ) 100 MG tablet Take 1 tablet (100 mg total) by mouth 3 (three) times daily. 270 tablet 3   Lancets (ONETOUCH DELICA PLUS LANCET33G) MISC Apply 1 each topically 3 (three) times daily.     magnesium  hydroxide (MILK OF MAGNESIA) 400 MG/5ML suspension Take 15 mLs by mouth daily as needed for mild constipation.     ondansetron  (ZOFRAN -ODT) 4 MG disintegrating tablet 4mg  ODT q4 hours prn nausea/vomit 20 tablet 0   ONETOUCH VERIO test strip 1 each 3 (three) times daily.     SANTYL ointment Apply topically daily.     mupirocin ointment (BACTROBAN) 2 % Apply topically 2 (two) times daily. (Patient not taking: Reported on 06/21/2024)     No current facility-administered medications for this visit.     Past Medical History:  Diagnosis Date   ANEMIA, IRON DEFICIENCY 05/07/2009   Qualifier: Diagnosis of  By: Kassie MD, Sean A    ARTHRITIS 05/30/2008   Qualifier: Diagnosis of  By: Claudene GUSS Railing     DIABETES MELLITUS, TYPE II 07/16/2007   Qualifier: Diagnosis of  By: Wilhemina RMA, Lucy     Diastolic congestive heart failure (HCC) 09/2017   DIVERTICULOSIS, COLON 05/30/2008   Qualifier: Diagnosis of  By: Claudene GUSS Railing     Dyspnea    Glaucoma    HYPERLIPIDEMIA 07/16/2007   Qualifier: Diagnosis of  By: Wilhemina RMA, Lucy     Hypertension    Normocytic anemia     Past  Surgical History:  Procedure Laterality Date   ABDOMINAL AORTOGRAM W/LOWER EXTREMITY N/A 10/23/2021   Procedure: ABDOMINAL AORTOGRAM W/LOWER EXTREMITY;  Surgeon: Lanis Fonda BRAVO, MD;  Location: [facility] INVASIVE CV LAB;  Service: Cardiovascular;  Laterality: N/A;   ABDOMINAL HYSTERECTOMY      Social History   Socioeconomic History   Marital status: Widowed    Spouse name: Not on file   Number of children: Not on file   Years of education: Not on file   Highest  education level: Not on file  Occupational History   Not on file  Tobacco Use   Smoking status: Former   Smokeless tobacco: Never  Vaping Use   Vaping status: Never Used  Substance and Sexual Activity   Alcohol use: No   Drug use: No   Sexual activity: Never  Other Topics Concern   Not on file  Social History Narrative   Widowed.  No children.  Has a brother.     Social Drivers of Corporate investment banker Strain: Not on file  Food Insecurity: Not on file  Transportation Needs: Not on file  Physical Activity: Not on file  Stress: Not on file  Social Connections: Not on file  Intimate Partner Violence: Not on file    Family History  Problem Relation Age of Onset   Diabetes Mellitus II Other    Pancreatic cancer Mother    Heart disease Father    CAD Brother     ROS: Recent weight loss but no fevers or chills, productive cough, hemoptysis, dysphasia, odynophagia, melena, hematochezia, dysuria, hematuria, rash, seizure activity, orthopnea, PND, pedal edema, claudication. Remaining systems are negative.  Physical Exam: Well-developed well-nourished in no acute distress.  Skin is warm and dry.  HEENT is normal.  Neck is supple.  Chest is clear to auscultation with normal expansion.  Cardiovascular exam is regular rate and rhythm.  Abdominal exam nontender or distended. No masses palpated. Extremities show no edema. neuro grossly intact  EKG Interpretation Date/Time:  Tuesday June 21 2024 09:33:12 EDT Ventricular Rate:  45 PR Interval:    QRS Duration:  106 QT Interval:  488 QTC Calculation: 422 R Axis:   257  Text Interpretation: Sinus bradycardia with mobitz 1 second degree AV block, PVCs Right superior axis deviation Pulmonary disease pattern Incomplete right bundle branch block Confirmed by Pietro Rogue ([id number]) on 06/21/2024 9:47:37 AM    A/P  1 bradycardia-patient remains asymptomatic.  Will continue to avoid AV nodal blocking agents given Mobitz 1  second-degree AV block.  May require pacemaker in the future.  2 hypertension-patient's blood pressure is borderline and she has lost weight recently likely contributing.  Decrease hydralazine  to 50 mg 3 times daily and follow blood pressure.  3 history of chronic diastolic congestive heart failure-she is euvolemic on examination.  Continue low-sodium diet.  4 mitral regurgitation-mild on most recent echocardiogram.  5 chronic stage III kidney disease-followed by nephrology.  6 recent weight loss-being evaluated by primary care.  Rogue Pietro, MD

## 2024-06-21 ENCOUNTER — Ambulatory Visit: Attending: Cardiology | Admitting: Cardiology

## 2024-06-21 ENCOUNTER — Encounter: Payer: Self-pay | Admitting: Cardiology

## 2024-06-21 VITALS — BP 100/50 | HR 76 | Ht 67.0 in | Wt 129.0 lb

## 2024-06-21 DIAGNOSIS — I5032 Chronic diastolic (congestive) heart failure: Secondary | ICD-10-CM

## 2024-06-21 DIAGNOSIS — I10 Essential (primary) hypertension: Secondary | ICD-10-CM

## 2024-06-21 DIAGNOSIS — R001 Bradycardia, unspecified: Secondary | ICD-10-CM | POA: Diagnosis not present

## 2024-06-21 MED ORDER — HYDRALAZINE HCL 50 MG PO TABS: 50.0000 mg | ORAL_TABLET | Freq: Three times a day (TID) | ORAL | 3 refills | Status: AC

## 2024-06-21 NOTE — Patient Instructions (Signed)
 Medication Instructions:   DECREASE HYDRALAZINE  TO 50 MG THREE TIMES DAILY=1/2 OF THE 100 MG TABLET THREE TIMES DAILY  *If you need a refill on your cardiac medications before your next appointment, please call your pharmacy*  Follow-Up: At [facility], you and your health needs are our priority.  As part of our continuing mission to provide you with exceptional heart care, our providers are all part of one team.  This team includes your primary Cardiologist (physician) and Advanced Practice Providers or APPs (Physician Assistants and Nurse Practitioners) who all work together to provide you with the care you need, when you need it.  Your next appointment:   6 month(s)  Provider:   ANY APP

## 2024-08-09 ENCOUNTER — Telehealth: Payer: Self-pay | Admitting: Podiatry

## 2024-08-09 ENCOUNTER — Telehealth: Payer: Self-pay

## 2024-08-09 NOTE — Telephone Encounter (Signed)
 Rx for dm shoes and inserts rcvd from Edwin A. Shelda, [facility]   Faxed to [facility]

## 2024-08-09 NOTE — Telephone Encounter (Signed)
 RX recvd from Treating Dr For shoes and inserts  RX, demographics visit with Dr Augusta from January 2025 sent to [facility] of wounds  Lolita Schultze CPed, CFo, CFm

## 2024-08-26 ENCOUNTER — Ambulatory Visit: Admitting: Podiatry

## 2024-08-26 VITALS — Ht 67.0 in | Wt 129.0 lb

## 2024-08-26 DIAGNOSIS — M79671 Pain in right foot: Secondary | ICD-10-CM | POA: Diagnosis not present

## 2024-08-26 DIAGNOSIS — B351 Tinea unguium: Secondary | ICD-10-CM | POA: Diagnosis not present

## 2024-08-26 DIAGNOSIS — M79672 Pain in left foot: Secondary | ICD-10-CM | POA: Diagnosis not present

## 2024-08-26 NOTE — Progress Notes (Signed)
 Patient presents for evaluation and treatment of tenderness and some redness around nails feet.  Tenderness around toes with walking and wearing shoes.  Physical exam:  General appearance: Alert, pleasant, and in no acute distress.  Vascular: Pedal pulses: DP 1/4 B/L, PT 2/4 B/L. Mild edema lower legs bilaterally  Neu  Dermatologic:  Nails thickened, disfigured, discolored 1-5 BL with subungual debris.  Redness and hypertrophic nail folds along nail folds bilaterally but no signs of drainage or infection.  Musculoskeletal:     Diagnosis: 1. Painful onychomycotic nails 1 through 5 bilaterally. 2. Pain toes 1 through 5 bilaterally.  Plan: -Debrided onychomycotic nails 1 through 5 bilaterally.  Sharply debrided nails with nail clipper and reduced with a power bur.  -Rx for diabetic shoes and insoles, 1 pair.  History of ulcerations feet bilaterally.  Type 2 diabetes with PVD  Return 3 months [facility]

## 2024-09-21 ENCOUNTER — Ambulatory Visit (HOSPITAL_COMMUNITY)
Admission: EM | Admit: 2024-09-21 | Discharge: 2024-09-21 | Disposition: A | Discharge status: Home / Self Care | Attending: Family Medicine | Admitting: Family Medicine

## 2024-09-21 ENCOUNTER — Encounter (HOSPITAL_COMMUNITY): Payer: Self-pay

## 2024-09-21 ENCOUNTER — Ambulatory Visit (INDEPENDENT_AMBULATORY_CARE_PROVIDER_SITE_OTHER)

## 2024-09-21 DIAGNOSIS — J069 Acute upper respiratory infection, unspecified: Secondary | ICD-10-CM | POA: Diagnosis not present

## 2024-09-21 DIAGNOSIS — R0602 Shortness of breath: Secondary | ICD-10-CM

## 2024-09-21 LAB — POC COVID19/FLU A&B COMBO
Covid Antigen, POC: NEGATIVE
Influenza A Antigen, POC: NEGATIVE
Influenza B Antigen, POC: NEGATIVE

## 2024-09-21 MED ORDER — BENZONATATE 100 MG PO CAPS: ORAL_CAPSULE | ORAL | 0 refills | Status: AC

## 2024-09-21 NOTE — ED Provider Notes (Signed)
 [facility]   [id number] 09/21/24 Arrival Time: 1010  ASSESSMENT & PLAN:  1. SOB (shortness of breath)   2. Viral URI with cough    I have personally viewed and independently interpreted the imaging studies ordered this visit. CXR: no acute changes when compared to previous CXR; cardiomegaly.  Discussed typical duration of likely viral illness/cold. Results for orders placed or performed during the hospital encounter of 09/21/24  POC Covid19/Flu A&B Antigen   Collection Time: 09/21/24 12:16 PM  Result Value Ref Range   Influenza A Antigen, POC Negative Negative   Influenza B Antigen, POC Negative Negative   Covid Antigen, POC Negative Negative   OTC symptom care as needed.  Meds ordered this encounter  Medications   benzonatate  (TESSALON ) 100 MG capsule    Sig: Take 1 capsule by mouth every 8 (eight) hours for cough.    Dispense:  21 capsule    Refill:  0     Follow-up Information     Shelda Atlas, MD.   Specialty: Internal Medicine Why: If worsening or failing to improve as anticipated. Contact information: [address] [place] [address] [phone]                 Reviewed expectations re: course of current medical issues. Questions answered. Outlined signs and symptoms indicating need for more acute intervention. Understanding verbalized. After Visit Summary given.   SUBJECTIVE: History from: Patient. Krista Rosales is a 89 y.o. female. Patient here today with c/o cough, nasal congestion, and SOB X 3 days. Patient has been taking Mucous DM OTC with some relief. No known sick contacts.  Denies: fever. Normal PO intake without n/v/d.  OBJECTIVE:  Vitals:   09/21/24 1138  BP: (!) 157/60  Pulse: 73  Resp: 16  Temp: 98.5 F (36.9 C)  TempSrc: Oral  SpO2: 94%    General appearance: alert; no distress Eyes: PERRLA; EOMI; conjunctiva normal HENT: [place]; AT; with nasal congestion Neck: supple  Lungs: speaks full sentences  without difficulty; unlabored; CTAB Extremities: no edema Skin: warm and dry Neurologic: normal gait Psychological: alert and cooperative; normal mood and affect  Labs: Results for orders placed or performed during the hospital encounter of 09/21/24  POC Covid19/Flu A&B Antigen   Collection Time: 09/21/24 12:16 PM  Result Value Ref Range   Influenza A Antigen, POC Negative Negative   Influenza B Antigen, POC Negative Negative   Covid Antigen, POC Negative Negative   Labs Reviewed  POC COVID19/FLU A&B COMBO    Imaging: DG Chest 2 View Result Date: 09/21/2024 CLINICAL DATA:  Shortness of breath for 3 days. EXAM: CHEST - 2 VIEW COMPARISON:  December 18, 2023. FINDINGS: Mild cardiomegaly is noted with central pulmonary vascular congestion. Minimal bibasilar subsegmental atelectasis or edema is noted with possible minimal pleural effusions. Bony thorax is unremarkable. IMPRESSION: Mild cardiomegaly with central pulmonary vascular congestion. Minimal bibasilar subsegmental atelectasis or edema is noted with possible minimal pleural effusions. Electronically Signed   By: Lynwood Landy Raddle M.D.   On: 09/21/2024 12:50    Allergies  Allergen Reactions   Wellbutrin [Bupropion]     unknown   Codeine     hallucinate   Penicillins Rash    Did it involve swelling of the face/tongue/throat, SOB, or low BP? Y Did it involve sudden or severe rash/hives, skin peeling, or any reaction on the inside of your mouth or nose? Y Did you need to seek medical attention at a hospital or doctor's  office? Y When did it last happen?  While at hospital    If all above answers are "NO", may proceed with cephalosporin use.     Past Medical History:  Diagnosis Date   ANEMIA, IRON DEFICIENCY 05/07/2009   Qualifier: Diagnosis of  By: Kassie MD, Sean A    ARTHRITIS 05/30/2008   Qualifier: Diagnosis of  By: Claudene GUSS Railing     DIABETES MELLITUS, TYPE II 07/16/2007   Qualifier: Diagnosis of  By: Wilhemina RMA, Lucy      Diastolic congestive heart failure (HCC) 09/2017   DIVERTICULOSIS, COLON 05/30/2008   Qualifier: Diagnosis of  By: Claudene GUSS Railing     Dyspnea    Glaucoma    HYPERLIPIDEMIA 07/16/2007   Qualifier: Diagnosis of  By: Wilhemina RMA, Lucy     Hypertension    Normocytic anemia    Social History   Socioeconomic History   Marital status: Widowed    Spouse name: Not on file   Number of children: Not on file   Years of education: Not on file   Highest education level: Not on file  Occupational History   Not on file  Tobacco Use   Smoking status: Former   Smokeless tobacco: Never  Vaping Use   Vaping status: Never Used  Substance and Sexual Activity   Alcohol use: No   Drug use: No   Sexual activity: Never  Other Topics Concern   Not on file  Social History Narrative   Widowed.  No children.  Has a brother.     Social Drivers of Corporate investment banker Strain: Not on file  Food Insecurity: Not on file  Transportation Needs: Not on file  Physical Activity: Not on file  Stress: Not on file  Social Connections: Not on file  Intimate Partner Violence: Not on file   Family History  Problem Relation Age of Onset   Diabetes Mellitus II Other    Pancreatic cancer Mother    Heart disease Father    CAD Brother    Past Surgical History:  Procedure Laterality Date   ABDOMINAL AORTOGRAM W/LOWER EXTREMITY N/A 10/23/2021   Procedure: ABDOMINAL AORTOGRAM W/LOWER EXTREMITY;  Surgeon: Lanis Fonda BRAVO, MD;  Location: [facility] INVASIVE CV LAB;  Service: Cardiovascular;  Laterality: N/A;   ABDOMINAL HYSTERECTOMY       Rolinda Rogue, MD 09/21/24 1400

## 2024-09-21 NOTE — ED Triage Notes (Signed)
 Patient here today with c/o cough, nasal congestion, and SOB X 3 days. Patient has been taking Mucous DM OTC with some relief. No known sick contacts.

## 2024-11-24 ENCOUNTER — Ambulatory Visit: Admitting: Podiatry

## 2024-11-24 ENCOUNTER — Encounter: Payer: Self-pay | Admitting: Podiatry

## 2024-11-24 DIAGNOSIS — M79672 Pain in left foot: Secondary | ICD-10-CM | POA: Diagnosis not present

## 2024-11-24 DIAGNOSIS — M79671 Pain in right foot: Secondary | ICD-10-CM | POA: Diagnosis not present

## 2024-11-24 DIAGNOSIS — B351 Tinea unguium: Secondary | ICD-10-CM

## 2024-11-24 NOTE — Progress Notes (Signed)
 Patient presents for evaluation and treatment of tenderness and some redness around nails feet.  Tenderness around toes with walking and wearing shoes.  Physical exam:  General appearance: Alert, pleasant, and in no acute distress.  Vascular: Pedal pulses: DP 1/4 B/L, PT 2/4 B/L. Mild edema lower legs bilaterally.  Capillary refill time immediate bilaterally  Neurologic:  Dermatologic:  Nails thickened, disfigured, discolored 2-5 BL with subungual debris.  Redness and hypertrophic nail folds along nail folds bilaterally but no signs of drainage or infection.  Musculoskeletal:     Diagnosis: 1. Painful onychomycotic nails 2 through 5 bilaterally. 2. Pain toes 2 through 5 bilaterally.  Plan: -Debrided onychomycotic nails 1 through 5 bilaterally.  Sharply debrided nails with nail clipper and reduced with a power bur.  Return 3 months dfc

## 2024-11-25 ENCOUNTER — Ambulatory Visit: Admitting: Podiatry

## 2025-02-24 ENCOUNTER — Ambulatory Visit: Admitting: Podiatry
